# Patient Record
Sex: Female | Born: 1950 | Race: Black or African American | Hispanic: No | Smoking: Never smoker
Health system: Southern US, Community
[De-identification: ages and names within clinical notes are randomized; demographics above are authoritative.]

## PROBLEM LIST (undated history)

## (undated) ENCOUNTER — Ambulatory Visit: Source: Home / Self Care

## (undated) DIAGNOSIS — I10 Essential (primary) hypertension: Secondary | ICD-10-CM

## (undated) DIAGNOSIS — R569 Unspecified convulsions: Secondary | ICD-10-CM

## (undated) HISTORY — PX: ABDOMINAL HYSTERECTOMY: SHX81

## (undated) HISTORY — PX: EYE SURGERY: SHX253

## (undated) HISTORY — PX: BRAIN TUMOR EXCISION: SHX577

## (undated) HISTORY — PX: CATARACT EXTRACTION: SUR2

---

## 1997-11-19 ENCOUNTER — Encounter: Payer: Self-pay | Admitting: Emergency Medicine

## 1997-11-19 ENCOUNTER — Emergency Department (HOSPITAL_COMMUNITY): Admission: EM | Admit: 1997-11-19 | Discharge: 1997-11-19 | Payer: Self-pay | Admitting: Emergency Medicine

## 1999-04-24 ENCOUNTER — Emergency Department (HOSPITAL_COMMUNITY): Admission: EM | Admit: 1999-04-24 | Discharge: 1999-04-24 | Payer: Self-pay | Admitting: Emergency Medicine

## 1999-12-17 ENCOUNTER — Encounter: Payer: Self-pay | Admitting: Family Medicine

## 1999-12-17 ENCOUNTER — Ambulatory Visit (HOSPITAL_COMMUNITY): Admission: RE | Admit: 1999-12-17 | Discharge: 1999-12-17 | Payer: Self-pay | Admitting: Family Medicine

## 1999-12-29 ENCOUNTER — Encounter: Payer: Self-pay | Admitting: Neurosurgery

## 1999-12-29 ENCOUNTER — Inpatient Hospital Stay (HOSPITAL_COMMUNITY): Admission: EM | Admit: 1999-12-29 | Discharge: 2000-01-03 | Payer: Self-pay | Admitting: Emergency Medicine

## 1999-12-29 ENCOUNTER — Encounter: Payer: Self-pay | Admitting: Emergency Medicine

## 1999-12-31 ENCOUNTER — Encounter: Payer: Self-pay | Admitting: Neurosurgery

## 2000-01-02 ENCOUNTER — Encounter: Payer: Self-pay | Admitting: Neurosurgery

## 2000-01-08 ENCOUNTER — Encounter: Payer: Self-pay | Admitting: Neurosurgery

## 2000-01-08 ENCOUNTER — Inpatient Hospital Stay (HOSPITAL_COMMUNITY): Admission: RE | Admit: 2000-01-08 | Discharge: 2000-01-16 | Payer: Self-pay | Admitting: Neurosurgery

## 2000-01-08 ENCOUNTER — Encounter (INDEPENDENT_AMBULATORY_CARE_PROVIDER_SITE_OTHER): Payer: Self-pay | Admitting: Specialist

## 2000-01-09 ENCOUNTER — Encounter: Payer: Self-pay | Admitting: Neurosurgery

## 2000-01-11 ENCOUNTER — Encounter: Payer: Self-pay | Admitting: Neurosurgery

## 2000-01-28 ENCOUNTER — Encounter: Admission: RE | Admit: 2000-01-28 | Discharge: 2000-02-22 | Payer: Self-pay | Admitting: Neurosurgery

## 2000-02-19 ENCOUNTER — Encounter: Payer: Self-pay | Admitting: Neurosurgery

## 2000-02-19 ENCOUNTER — Ambulatory Visit (HOSPITAL_COMMUNITY): Admission: RE | Admit: 2000-02-19 | Discharge: 2000-02-19 | Payer: Self-pay | Admitting: Neurosurgery

## 2000-03-18 ENCOUNTER — Other Ambulatory Visit: Admission: RE | Admit: 2000-03-18 | Discharge: 2000-03-18 | Payer: Self-pay | Admitting: Gynecology

## 2000-03-28 ENCOUNTER — Encounter (INDEPENDENT_AMBULATORY_CARE_PROVIDER_SITE_OTHER): Payer: Self-pay

## 2000-03-28 ENCOUNTER — Other Ambulatory Visit: Admission: RE | Admit: 2000-03-28 | Discharge: 2000-03-28 | Payer: Self-pay | Admitting: Gynecology

## 2000-05-10 ENCOUNTER — Ambulatory Visit (HOSPITAL_COMMUNITY): Admission: RE | Admit: 2000-05-10 | Discharge: 2000-05-10 | Payer: Self-pay | Admitting: Neurosurgery

## 2000-05-10 ENCOUNTER — Encounter: Payer: Self-pay | Admitting: Neurosurgery

## 2000-08-07 ENCOUNTER — Encounter (INDEPENDENT_AMBULATORY_CARE_PROVIDER_SITE_OTHER): Payer: Self-pay | Admitting: Specialist

## 2000-08-07 ENCOUNTER — Inpatient Hospital Stay (HOSPITAL_COMMUNITY): Admission: RE | Admit: 2000-08-07 | Discharge: 2000-08-08 | Payer: Self-pay | Admitting: Gynecology

## 2000-10-13 ENCOUNTER — Ambulatory Visit (HOSPITAL_COMMUNITY): Admission: RE | Admit: 2000-10-13 | Discharge: 2000-10-13 | Payer: Self-pay | Admitting: Family Medicine

## 2000-10-13 ENCOUNTER — Encounter: Payer: Self-pay | Admitting: Family Medicine

## 2000-11-13 ENCOUNTER — Encounter: Payer: Self-pay | Admitting: Neurosurgery

## 2000-11-13 ENCOUNTER — Ambulatory Visit (HOSPITAL_COMMUNITY): Admission: RE | Admit: 2000-11-13 | Discharge: 2000-11-13 | Payer: Self-pay | Admitting: Neurosurgery

## 2001-11-12 ENCOUNTER — Other Ambulatory Visit: Admission: RE | Admit: 2001-11-12 | Discharge: 2001-11-12 | Payer: Self-pay | Admitting: Gynecology

## 2002-10-31 ENCOUNTER — Emergency Department (HOSPITAL_COMMUNITY): Admission: EM | Admit: 2002-10-31 | Discharge: 2002-11-01 | Payer: Self-pay | Admitting: Emergency Medicine

## 2003-02-22 ENCOUNTER — Emergency Department (HOSPITAL_COMMUNITY): Admission: EM | Admit: 2003-02-22 | Discharge: 2003-02-22 | Payer: Self-pay | Admitting: Emergency Medicine

## 2003-02-23 ENCOUNTER — Encounter (INDEPENDENT_AMBULATORY_CARE_PROVIDER_SITE_OTHER): Payer: Self-pay | Admitting: *Deleted

## 2003-02-23 ENCOUNTER — Ambulatory Visit (HOSPITAL_COMMUNITY): Admission: RE | Admit: 2003-02-23 | Discharge: 2003-02-23 | Payer: Self-pay | Admitting: Gastroenterology

## 2003-06-24 ENCOUNTER — Emergency Department (HOSPITAL_COMMUNITY): Admission: EM | Admit: 2003-06-24 | Discharge: 2003-06-24 | Payer: Self-pay | Admitting: Family Medicine

## 2004-07-23 ENCOUNTER — Encounter (HOSPITAL_COMMUNITY): Admission: RE | Admit: 2004-07-23 | Discharge: 2004-10-21 | Payer: Self-pay | Admitting: Family Medicine

## 2004-10-29 ENCOUNTER — Emergency Department (HOSPITAL_COMMUNITY): Admission: EM | Admit: 2004-10-29 | Discharge: 2004-10-29 | Payer: Self-pay | Admitting: Family Medicine

## 2004-10-31 ENCOUNTER — Emergency Department (HOSPITAL_COMMUNITY): Admission: EM | Admit: 2004-10-31 | Discharge: 2004-10-31 | Payer: Self-pay | Admitting: Family Medicine

## 2005-03-23 ENCOUNTER — Emergency Department (HOSPITAL_COMMUNITY): Admission: EM | Admit: 2005-03-23 | Discharge: 2005-03-23 | Payer: Self-pay | Admitting: Family Medicine

## 2005-12-28 ENCOUNTER — Emergency Department (HOSPITAL_COMMUNITY): Admission: EM | Admit: 2005-12-28 | Discharge: 2005-12-28 | Payer: Self-pay | Admitting: Family Medicine

## 2006-08-06 ENCOUNTER — Encounter: Admission: RE | Admit: 2006-08-06 | Discharge: 2006-08-06 | Payer: Self-pay | Admitting: Family Medicine

## 2006-12-05 ENCOUNTER — Emergency Department (HOSPITAL_COMMUNITY): Admission: EM | Admit: 2006-12-05 | Discharge: 2006-12-05 | Payer: Self-pay | Admitting: Family Medicine

## 2007-01-12 ENCOUNTER — Encounter (HOSPITAL_COMMUNITY): Admission: RE | Admit: 2007-01-12 | Discharge: 2007-01-13 | Payer: Self-pay | Admitting: Internal Medicine

## 2007-03-20 ENCOUNTER — Encounter: Admission: RE | Admit: 2007-03-20 | Discharge: 2007-03-20 | Payer: Self-pay | Admitting: Orthopedic Surgery

## 2007-07-04 ENCOUNTER — Emergency Department (HOSPITAL_COMMUNITY): Admission: EM | Admit: 2007-07-04 | Discharge: 2007-07-04 | Payer: Self-pay | Admitting: Family Medicine

## 2007-12-14 ENCOUNTER — Ambulatory Visit (HOSPITAL_COMMUNITY): Admission: RE | Admit: 2007-12-14 | Discharge: 2007-12-15 | Payer: Self-pay | Admitting: Internal Medicine

## 2007-12-18 ENCOUNTER — Encounter (HOSPITAL_COMMUNITY): Admission: RE | Admit: 2007-12-18 | Discharge: 2008-02-11 | Payer: Self-pay | Admitting: Internal Medicine

## 2008-12-20 ENCOUNTER — Emergency Department (HOSPITAL_COMMUNITY): Admission: EM | Admit: 2008-12-20 | Discharge: 2008-12-20 | Payer: Self-pay | Admitting: Family Medicine

## 2009-10-30 ENCOUNTER — Encounter: Admission: RE | Admit: 2009-10-30 | Discharge: 2009-10-30 | Payer: Self-pay | Admitting: Family Medicine

## 2009-11-22 ENCOUNTER — Ambulatory Visit: Payer: Self-pay | Admitting: Diagnostic Radiology

## 2009-11-22 ENCOUNTER — Emergency Department (HOSPITAL_BASED_OUTPATIENT_CLINIC_OR_DEPARTMENT_OTHER): Admission: EM | Admit: 2009-11-22 | Discharge: 2009-11-22 | Payer: Self-pay | Admitting: Emergency Medicine

## 2010-03-25 ENCOUNTER — Encounter: Payer: Self-pay | Admitting: Internal Medicine

## 2010-03-25 ENCOUNTER — Encounter: Payer: Self-pay | Admitting: Family Medicine

## 2010-03-26 ENCOUNTER — Encounter: Payer: Self-pay | Admitting: Internal Medicine

## 2010-07-13 ENCOUNTER — Emergency Department (HOSPITAL_COMMUNITY): Payer: BC Managed Care – PPO

## 2010-07-13 ENCOUNTER — Emergency Department (HOSPITAL_COMMUNITY)
Admission: EM | Admit: 2010-07-13 | Discharge: 2010-07-13 | Disposition: A | Discharge status: Home / Self Care | Payer: BC Managed Care – PPO | Attending: Emergency Medicine | Admitting: Emergency Medicine

## 2010-07-13 DIAGNOSIS — T426X1A Poisoning by other antiepileptic and sedative-hypnotic drugs, accidental (unintentional), initial encounter: Secondary | ICD-10-CM | POA: Insufficient documentation

## 2010-07-13 DIAGNOSIS — R269 Unspecified abnormalities of gait and mobility: Secondary | ICD-10-CM | POA: Insufficient documentation

## 2010-07-13 DIAGNOSIS — R404 Transient alteration of awareness: Secondary | ICD-10-CM | POA: Insufficient documentation

## 2010-07-13 DIAGNOSIS — R4789 Other speech disturbances: Secondary | ICD-10-CM | POA: Insufficient documentation

## 2010-07-13 DIAGNOSIS — R110 Nausea: Secondary | ICD-10-CM | POA: Insufficient documentation

## 2010-07-13 DIAGNOSIS — Z79899 Other long term (current) drug therapy: Secondary | ICD-10-CM | POA: Insufficient documentation

## 2010-07-13 DIAGNOSIS — R42 Dizziness and giddiness: Secondary | ICD-10-CM | POA: Insufficient documentation

## 2010-07-13 DIAGNOSIS — Z7982 Long term (current) use of aspirin: Secondary | ICD-10-CM | POA: Insufficient documentation

## 2010-07-13 DIAGNOSIS — R471 Dysarthria and anarthria: Secondary | ICD-10-CM | POA: Insufficient documentation

## 2010-07-13 DIAGNOSIS — I10 Essential (primary) hypertension: Secondary | ICD-10-CM | POA: Insufficient documentation

## 2010-07-13 DIAGNOSIS — D320 Benign neoplasm of cerebral meninges: Secondary | ICD-10-CM | POA: Insufficient documentation

## 2010-07-13 DIAGNOSIS — R51 Headache: Secondary | ICD-10-CM | POA: Insufficient documentation

## 2010-07-13 LAB — URINALYSIS, ROUTINE W REFLEX MICROSCOPIC
Bilirubin Urine: NEGATIVE
Glucose, UA: NEGATIVE mg/dL
Hgb urine dipstick: NEGATIVE
Protein, ur: NEGATIVE mg/dL

## 2010-07-13 LAB — COMPREHENSIVE METABOLIC PANEL
ALT: 9 U/L (ref 0–35)
CO2: 30 mEq/L (ref 19–32)
Calcium: 10.5 mg/dL (ref 8.4–10.5)
Creatinine, Ser: 0.71 mg/dL (ref 0.4–1.2)
GFR calc Af Amer: >60 mL/min (ref >60)
GFR calc non Af Amer: >60 mL/min (ref >60)
Glucose, Bld: 102 mg/dL — ABNORMAL HIGH (ref 70–99)
Sodium: 140 mEq/L (ref 135–145)
Total Protein: 7.5 g/dL (ref 6.0–8.3)

## 2010-07-13 LAB — DIFFERENTIAL
Basophils Absolute: 0 10*3/uL (ref 0.0–0.1)
Lymphocytes Relative: 19 % (ref 12–46)
Lymphs Abs: 1.4 10*3/uL (ref 0.7–4.0)
Neutrophils Relative %: 74 % (ref 43–77)

## 2010-07-13 LAB — CBC
HCT: 40 % (ref 36.0–46.0)
MCV: 86.8 fL (ref 78.0–100.0)
Platelets: 296 10*3/uL (ref 150–400)
RBC: 4.61 MIL/uL (ref 3.87–5.11)
WBC: 7.5 10*3/uL (ref 4.0–10.5)

## 2010-07-13 LAB — CK TOTAL AND CKMB (NOT AT ARMC)
CK, MB: 1.4 ng/mL (ref 0.3–4.0)
Total CK: 79 U/L (ref 7–177)

## 2010-07-13 LAB — TROPONIN I: Troponin I: <0.3 ng/mL (ref <0.30)

## 2010-07-13 MED ORDER — GADOBENATE DIMEGLUMINE 529 MG/ML IV SOLN
20.0000 mL | Freq: Once | INTRAVENOUS | Status: AC
  Administered 2010-07-13: 20 mL via INTRAVENOUS

## 2010-07-20 NOTE — Op Note (Signed)
[place]. [facility]  Patient:    Deborah Gallegos, Deborah Gallegos                      MRN: [id number] Proc. Date: 01/08/00 Adm. Date:  [id number] Attending:  Barton Fanny                           Operative Report  PREOPERATIVE DIAGNOSIS:  Right sphenoid wing meningioma.  POSTOPERATIVE DIAGNOSIS:  Right sphenoid ring meningioma.  PROCEDURE: 1. Exposure of right extracranial carotid artery. 2. Right pterional craniotomy and subtotal resection of right sphenoid wing    meningioma with Stealth stereotaxis and microdissection with the operating    microscope. 3. Placement of left frontal intraventricular catheter.  SURGEON:  Hewitt Shorts, M.D.  ASSISTANTS:  Mena Goes. Franky Macho, M.D., and Danae Orleans. Venetia Maxon, M.D.  ANESTHESIA:  General endotracheal.  INDICATION:  This is a 60 year old woman who presented with a single seizure with a large right sphenoid ring meningioma, approximately 5 cm in diameter, which encased the right internal carotid artery and caused significant and displacement of the brainstem.  The decision was made to proceed with craniotomy and resection of this tumor.  To facilitate the proximal arterial control, the extracranial carotid artery was exposed, and a left frontal intraventricular catheter was placed to provide relaxation via CSF drainage.  DESCRIPTION OF PROCEDURE:  Patient brought to the operating room and placed under general endotracheal anesthesia.  The patient was placed in a three-pin [place] head holder.  Rolls were placed beneath the right shoulder, and the head was gently turned to the left and gently extended.  The Stealth stereotaxis had already been loaded into the machine and the Stealth apparatus was assembled, and localization and identification of the head and frame was performed.  We then shaved the frontal scalp bilaterally as well as the right temporal scalp, and then the scalp and right side of the neck was  prepped with Betadine soap and solution and draped in a sterile fashion.  We first exposed the right extracranial carotid artery.  An oblique incision was made paralleling the anterior border of the sternocleidomastoid.  Dissection was carried down through the subcutaneous tissue.  Bipolar cautery was used to maintain hemostasis.  Dissection was carried down through the platysma, and then dissection was carried out past the jugular vein to the right common carotid artery.  This was dissected distally to the carotid bifurcation, and we placed vessel loops around the right common carotid artery, the right internal carotid artery, the right external carotid artery, and the right superior thyroid artery.  We then packed a bacitracin-soaked sponge in that wound and then proceeded with the craniotomy incision.  A curvilinear right pterional incision was made.  We had mapped out the superior temporal artery with the Doppler.  The incision was made through the skin, and then dissection was carried out bluntly through the subcutaneous tissue; however, as we dissected down to the temporalis fascia, we were never able to find any significant arterial vessels, and certainly not a prominent superficial temporal artery.  The entire wound was opened in a careful fashion without any significant bleeding but again without identifying the superficial temporal artery, and certainly it had not been transected.  However, it was felt to be quite small.  We then did an interfascial flap, incising across the superior aspect of the temporalis fascia and muscle and then opening the temporalis fascia  about one-third of the way posteriorly in a curvilinear vertical fashion, essentially behind the fat pad so as to preserve the branch of the facial nerve to the frontalis.  This was was flapped along with the scalp flap toward the pterion; however, the temporalis muscle and the posterior aspect of the temporalis fascia  was flapped posterior inferiorly.  Good exposure was created.  We then placed the left frontal intraventricular catheter.  A 2 cm in length incision was made in the left midpupillary line overlying the coronal suture.  Periosteum was divided and then using the Midas Rex drill with an M8 bur, a small bur hole was made, the dura was coagulated and incised, and then the pia was coagulated and incised.  Then we passed a ventricular catheter into the left frontal horn.  Good CSF flow was noted.  It was capped off and tunneled subcutaneously with a trocar and connected to a collection system.  It was left clamped until the dura was subsequently opened, and then it was opened to allow for drainage.  We then proceeded with the craniotomy.  Three bur holes were made using the M3 and M8 burs.  The dura was dissected from the overlying skull and then using the B1 craniotome attachment, we were able to cut between the bur holes.  Then using the M8 bur, we were able to drill down the pterion and the sphenoid wing.  The bone flap was then mobilized and removed.  We then with loupe magnification proceeded to remove additional portions of the sphenoid wing, and good removal of the sphenoid wing was achieved.  Using the double action on the drill, the craniotomy was extended inferiorly and anteriorly to expose more of the temporal fossa.  The dura was then opened in a curvilinear fashion and then was split toward the pterion and flapped to either side.  The brain swelled slightly, and the intraventricular catheter was opened and good relaxation was achieved with approximately 20 cc of CSF drainage.  We were then able to identify the tumor in the anterior portion of the temporal fossa. The anterior temporal lobe, which had been severely compressed and thinned, was gently dissected away from the tumor, and the small remnant of residual anterior temporal lobe was removed with subpial dissection using  bipolar cautery and suction.  The tumor itself was firm but not hard.  We were able to coagulate its capsule and small veins and vessels entering it using bipolar cautery.  We then incised the capsule and began to debulk the tumor using the  Cavitron ultrasonic aspirator.  This was a slow process because the tumor was moderately fibrous.  Portions of the tumor were sent for frozen section, which reported a meningioma.  Additional portions of tumor had been sent for permanent section.  As the tumor was debulked, we were gradually able to dissect around the arachnoid plane around the lesion, separating the brain surface from the tumor.  We were then able to remove additional portions of tumor and gradually removed the tumor that filled the anterior right temporal fossa.  The microscope was then draped and brought into the field to provide [place] with magnification and illumination and visualization, and the remainder of the procedure was performed using microdissection technique.  We continued to debulk the tumor, and attention was paid to the area of the tumor that protruded through the tentorial incisura.  This tumor was carefully debulked and removed.  There was a portion of the tumor overlying  the sphenoid wing lying in the right frontal fossa, and this was debulked as well using a similar technique of exposing the arachnoid plane, coagulating small vessels entering into the tumor, incising the tumor, debulking it with the Cavitron ultrasonic aspirator, and gently shrinking back the capsule with bipolar cautery.  As the tumor resection proceeded, we identified branches of the middle cerebral artery.  These were freed from the tumor as feasible; however, as we followed the artery proximally, it became entirely encased by tumor, which could not be dissected away from it, and it was felt that the risk of further dissection with resulting vascular injury was too great to proceed with further  tumor resection of that portion of the tumor directly adherent to the middle cerebral artery.  There were additional portions of the tumor which extended into the right cavernous sinus and which fully encased the right internal carotid artery, and it was felt that proceeding with additional tumor resection in this region would result in catastrophic complications and deficits, and therefore, once all of the tumor that was feasible to be removed safely for the patient had been removed, we established hemostasis, lined the resection cavity, the remaining edges of tumor, and the pial surface that had been adjacent to the tumor with Surgicel and then proceeded with closure.  The resection cavity was filled with saline.  Good hemostasis was established and confirmed.  The dura was closed with interrupted 4-0 Nurolon sutures.  The bone flap was secured with Osteomed plates with a bur hole cover placed two medium straight plates.  We used the 4 mm screws.  Prior to placing the bone flap, the dura was tacked up around the margins of the craniotomy with dural tack-up sutures.  Once the bone flap was secured, temporalis fascia and muscle were approximated with interrupted 2-0 running Vicryl sutures, the scalp was then closed by closing the galea with interrupted inverted 2-0 interrupted Vicryl sutures.  The skin edges were closed with surgical staples.  We then re-exposed the right extracranial carotid artery exposure.  We carefully removed the vessel loops and then after confirming hemostasis was present proceeded with closure.  The platysma was closed with interrupted 2-0 interrupted Vicryl sutures, the subcutaneous and subcuticular layer closed with 2-0 running Vicryl sutures, and the skin edges were approximated with surgical staples.  The patient tolerated the procedure well.  The estimated blood loss was 600 cc.  The patient was given two units of packed red blood cells.  Following surgery,  she was not reversed from anesthetic but rather transferred to the neurosurgical intensive care unit intubated and placed on a ventilator for further care. DD:  01/08/00 TD:  01/09/00 Job: [id number]

## 2010-07-20 NOTE — Discharge Summary (Signed)
[place]. [facility]  Patient:    Deborah Gallegos, Deborah Gallegos                      MRN: [id number] Adm. Date:  [id number] Disc. Date: 01/03/00 Attending:  Barton Fanny                           Discharge Summary  ADMISSION HISTORY AND PHYSICAL EXAMINATION:  The patient is a 60 year old right handed black female who was brought to the emergency room by EMS after having suffered a seizure at home.  She had never had any previous seizure activity.  She was evaluated by the emergency room physician and found by CT scan to have a large 5 cm in diameter right sphenoid lumen meningioma that encased the right internal carotid artery and was associated with significant surrounding cerebral edema, mass effect and midline shift.  The patient had been treated in the past for hypertension but was not currently being treated. She also had a history of kidney stones.  She has no allergies.  She took no medications on a regular basis.  General examination was unremarkable. Neurologic examination showed the patient to be neurologically intact.  HOSPITAL COURSE:  The patient was admitted and started on Dilantin. She was loaded with 1000 mg and started on 200 mg b.i.d.  We rechecked Dilantin level the following day and it was 10.6 and she was reloaded with additional Dilantin and her Dilantin level has been in the high therapeutic range since that time.  On the day of discharge Dilantin level was 17.0.  She is to be continued on 200 mg b.i.d.  An MRI and MRA of the brain was performed which showed encasement of the intracranial right internal carotid artery as well as displacement of the brain stem and right middle cerebral artery.  It was felt that most likely the tumor invades the right cavernous sinus and the right optic canal.  The patient was found on admission to have a significant anemia with hemoglobin of 8.0 and a hematocrit of 26.3.  Serum iron was found to be  less than 10.  Serum ferritin was 3.  These are consistent with a profound iron deficiency anemia.  Pelvic ultrasound was performed which revealed enlarged uterus with numerous fibroids with the largest being over 5 cm in diameter. GYN consultation was obtained by Dr. Teodora Medici who confirmed the diagnosis of uterine fibroids resulting in menorrhagia.  He discussed the options of total abdominal hysterectomy.  He plans on having the patient follow up with him next month, after she recuperates from her brain surgery.  At this point, the patient is anticipating wanting to go ahead with hysterectomy.  The patients seizures have been controlled with Dilantin.  She has had no seizure activity since hospitalization.  She was found to be hypokalemic.  She was treated with potassium and the hypokalemia has resolved.  Cerebral arteriography was performed.  The tumor was visualized and had a slight blush but has minimal arterial feeders off the meningohypophyseal trunk, the anterior ethmoid and the middle meningeal as well as some small feeders.  However, it was not felt that this was sufficiently vascular to warrant interventional embolization.  Her arteriogram showed that the internal carotid artery and the right middle cerebral artery with no communication to the right anterior cerebral artery which instead is fed off the left internal carotid artery; however, there is anastomosis  from the vertebral basilar system through the right posterior communicating artery which does fill the right middle cerebral artery when there is compression of the right internal carotid artery.  The patient was seen in primary care consultation by Dr. Renaye Rakers.  She did develop a herpetic fever sore on her lip most likely due to her Decadron.  She was started on Valtrex which will be continued upon her discharge.  Our plan is to readmit the patient on January 08, 2000 (in five days) for craniotomy for  resection of her tumor with Stealth stereotatic guidance. A Stealth MRI was performed as well.  We will expose the right extracranial carotid artery to allow for proximal control.  Thyroid function studies were obtained and are normal.  I have spoken with the patient and her son at length regarding her condition, the findings of her studies, and our recommendations and plans for treatment. The patients and her sons questions have been answered and will allow her to be discharged to home and return in five days for surgery.  DISCHARGE DIAGNOSES: 1. Large right sphenoid lumen angioma. Scheduled for resection on January 08, 2000. 2. New onset seizure disorder with a single seizure at home.  Seizure disorder    is secondary to her brain tumor and has been well controlled with Dilantin. 3. Severe iron deficiency anemia secondary to menorrhagia secondary to fibroid    uterus.  The patient has been seen in gynecology consultation and plan is    to proceed with total abdominal hysterectomy after she recuperates from her    craniotomy. 4. Hypokalemia.  Most likely secondary to diuretics previously used for    treatment of hypertension which has been corrected with potassium    supplementation. 5. Herpetic fever blisters secondary to Decadron treated with Valtrex.  The patient was transfused two units of packed red blood cells in anticipation of craniotomy next week and her hemoglobin rose up to 10.5 grams.  DISCHARGE MEDICATIONS:  Iron sulfate 325 mg one tablet p.o. t.i.d., 100 tablets with three refills.  Valtrex 500 mg capsules one capsule p.o. b.i.d., 10 capsules, no refills.  Pepcid 20 mg one tablet p.o. b.i.d., 6 tablets, one refill.  Decadron 4 mg tablets, one tablet p.o. q.i.d., 50 tablets, no refills. Dilantin 100 mg capsules, two capsules p.o. b.i.d., 125 capsules, six refills, brand name only. DD:  01/03/00 TD:  01/03/00 Job: [id number]

## 2010-07-20 NOTE — Op Note (Signed)
NAME:  Deborah Gallegos, Deborah Gallegos                         ACCOUNT NO.:  [id number]   MEDICAL RECORD NO.:  [id number]                   PATIENT TYPE:  AMB   LOCATION:  ENDO                                 FACILITY:  [facility]   PHYSICIAN:  John C. Madilyn Fireman, M.D.                 DATE OF BIRTH:  02/04/1951   DATE OF PROCEDURE:  02/23/2003  DATE OF DISCHARGE:                                 OPERATIVE REPORT   PROCEDURE:  Colonoscopy with polypectomy and biopsy.   INDICATION FOR PROCEDURE:  Rectal bleeding and lower abdominal cramping  recently.   DESCRIPTION OF PROCEDURE:  The patient was placed in the left lateral  decubitus position and placed on the pulse monitor with continuous low-flow  oxygen delivered by nasal cannula.  She was sedated with 100 mcg IV fentanyl  and 8 mg IV Versed.  The Olympus video colonoscope was inserted into the  rectum and advanced to the cecum, confirmed by transillumination at  McBurney's point and visualization of the ileocecal valve and appendiceal  orifice.  The prep was excellent.  The cecum, ascending colon, and  transverse colon appeared normal with no masses, polyps, diverticula, or  other mucosal abnormalities.  There was a transition that appeared to be a  fine, granular erythema and edema near the splenic flexure into the  descending colon that gradually faded to normal mucosa somewhere around the  proximal transverse colon, and the mucosa remained normal down to the anus.  The appearance although in distribution more suggestive of an ischemic  process, appeared endoscopically more consistent with mild inflammatory  bowel disease.  There were no submucosal hemorrhages or large or aphthous  ulcers.  Also, in the descending colon, there was a 1.5 cm pedunculated  polyp that was removed by snare.  Otherwise, the descending and sigmoid  revealed no other abnormalities.  At the rectosigmoid junction, there was a  broad, flat, approximately 2 cm in diameter polyp at  about 20 cm, and this  was removed by snare and sent in a separate specimen container.  The  remainder of the rectum appeared normal.  The scope was then withdrawn, and  the patient returned to the recovery room in stable condition.  She  tolerated the procedure well, and there were no immediate complications.   IMPRESSION:  1. Large descending and sigmoid polyps.  2. Appearance of colitis in the descending and part of the sigmoid colon.   PLAN:  Await all biopsy results.                                               John C. Madilyn Fireman, M.D.    J[facility]/MEDQ  D:  02/23/2003  T:  02/23/2003  Job:  [id number]   cc:   Renaye Rakers, M.D.  [address], [address]  [place]  [place] [address]  Fax: [phone]

## 2010-07-20 NOTE — H&P (Signed)
[place]. [facility]  Patient:    Deborah Gallegos, Deborah Gallegos                      MRN: [id number] Adm. Date:  [id number] Attending:  Barton Fanny CC:         Geraldo Pitter, M.D.   History and Physical  HISTORY OF PRESENT ILLNESS:  The patient is a 60 year old right-handed black female who presented to the [facility] emergency room having been brought there by EMS after having suffered a seizure at home.  She has never had any previous seizure activity.  She was noted by her son and daughter to have a tonic-type seizure that lasted 10 to 15 minutes.  She was brought to the emergency room by EMS and has had no further seizure activity.  The patient was evaluated by Doug Sou, M.D., the emergency room physician.  CT scan of the brain without and with contrast was obtained and revealed a large 5 cm in diameter right sphenoid wing meningioma that may well encase the right internal carotid artery and it has significant surrounding cerebral edema, overall mass effect, and midline shift.  Neurosurgical consultation was requested by Dr. Ethelda Chick.  The patient has had some occasional headaches over the past couple of weeks on one or two occasions for which she has taken some Aleve.  She has not been having consistent or severe headaches.  She denies any diplopia, blurred vision, weakness, language difficulties, or other neurologic dysfunction.  PAST MEDICAL HISTORY:  She has been treated in the past for hypertension, but does not currently require treatment.  She also has a history of kidney stones. She has no history of myocardial infarction, cancer, stroke, peptic ulcer disease, diabetes mellitus, or lung disease.  Her only previous surgery was a basketing of her kidney stones about 10 to 15 years ago.  She has had no other surgeries.  ALLERGIES:  No known drug allergies.  MEDICATIONS:  None on a regular basis.  FAMILY HISTORY:  Her  mother is age 61 and in good health.  Her father died at an unknown age of emphysema, he was a smoker.  SOCIAL HISTORY:  The patient is widowed.  She has two sons and one daughter. She is a third grade school Runner, broadcasting/film/video at [employer].  She does not smoke tobacco or drink alcoholic beverages.  REVIEW OF SYSTEMS:  Unremarkable other than is described in her history of present illness and past medical history.  PHYSICAL EXAMINATION:  GENERAL:  The patient is a well-developed, well-nourished black female in no acute distress.  VITAL SIGNS:  Temperature 98.6, pulse 95, blood pressure 155/63, respiratory rate 22.  LUNGS:  Clear to auscultation.  She has symmetrical respiratory excursion.  HEART:  Regular rate and rhythm.  Normal S1 and S2.  There is no murmur.  ABDOMEN:  Soft and nontender.  Bowel sounds are present.  EXTREMITIES:  No clubbing, cyanosis, or edema.  NEUROLOGICAL:  The patient is awake, alert, oriented to her name, [facility], and October of 2001.  Her speech is full and she has good comprehension.  Cranial nerves show her pupils to be equal, round, and reactive to light and about 2.5 mm bilaterally.  Extraocular movements were intact.  Face has intact sensation.  Facial movement is symmetrical.  Hearing is intact.  Palatal movement is symmetrical, shoulder shrug is symmetrical, and tongue protrudes in the midline and well to  either side.  Motor examination shows 5/5 strength in the upper and lower extremities.  She has no drift of the upper extremities.  Sensory examination shows intact sensation to pinprick and tough.  Reflexes are symmetrical.  IMPRESSION: 1. Right sphenoid wing meningioma at least 5 cm in diameter with surrounding    cerebellar edema, significant overall mass effect, and midline shift.  The    lesion may well encase the right carotid artery. 2. New onset seizure secondary to #1.  PLAN:  The patient will be  admitted to the neurosurgical advanced care unit. She is being loaded with dilantin in the emergency room and will continue on dilantin subsequent to her admission.  She has been started on intravenous Decadron and will be started as well on intravenous Pepcid.  MRI and MRA of the brain will be obtained and she may well require cerebral arteriogram.  In the end, she will require resection of this lesion.  I have spoken with the patient and her son who is present throughout our evaluation this morning.  We had a chance to show them her CT scan and explain the nature of her lesion and our plans for further workup and treatments. Their questions were answered.  They do understand the seriousness of this situation.  They do also understand that this is a serious, but benign type of brain tumor. DD:  12/29/99 TD:  12/29/99 Job: [id number]

## 2010-07-20 NOTE — Consult Note (Signed)
[place]. [facility]  Patient:    Deborah Gallegos, Deborah Gallegos                      MRN: [id number] Proc. Date: 01/02/00 Adm. Date:  [id number] Attending:  Barton Fanny CC:         Hewitt Shorts, M.D.  Geraldo Pitter, M.D.   Consultation Report  CONSULT REQUESTING PHYSICIAN:  Hewitt Shorts, M.D.  REASON FOR CONSULTATION:  Fibroids, menorrhagia, anemia.  HISTORY OF PRESENT ILLNESS:  The patient is a 60 year old, gravida 3, para 3 black woman who has not had a pelvic exam or gynecological care for grater than 10 years.  Over the last two years, she has noted her menstrual flow to be significantly heavier and her cycles have remained regular with no intermenstrual bleeding.  She has not been recently sexually active and had no dyspareunia previously.  Ultrasound examination performed on December 31, 1999, revealed the uterus to be enlarged with numerous fibroids, the largest measuring 5.3 cm in the body of the uterus with a probable submucosal component.  The endometrial stripe was 11 mm.  Both ovaries contained what were described as complex ovarian cysts, 16 and 19 mm.  PAST MEDICAL HISTORY:  Surgical:  Cesarean section.  Medical:  Current brain tumor.  MEDICATIONS:  None outside of the hospital.  ALLERGIES:  None known.  HABITS:  Smoking - none.  ETOH - none.  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  Her children are very supportive.  FAMILY HISTORY:  Mother, question of fibroids, and no known cancer history.  PHYSICAL EXAMINATION:  Given the nature of the consultation, examination is deferred until the patients office visit.  PLAN:  Options for treatment were discussed at length with the patient including a total abdominal hysterectomy and uterine fibroid embolization. Both procedures and anticipated outcomes were discussed at length.  The patient will make an  appointment to be seen in the office in December to further discuss options for therapy and to make plans for therapy based on her recovery status from her impending neurosurgery. DD:  01/02/00 TD:  01/02/00 Job: [id number]

## 2010-07-20 NOTE — Discharge Summary (Signed)
[place]. [facility]  Patient:    Deborah Gallegos, Deborah Gallegos                      MRN: [id number] Adm. Date:  [id number] Disc. 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  HOSPITAL COURSE:  The patient was admitted to the hospital on a same day as procedure basis on January 08, 2000, and underwent exposure of the extracranial carotid artery with pterional craniotomy and subtotal resection of tumor using microdissection and stealth procedure.  She did well with that surgery.  She was observed in the ICU.  She was initially lethargic and had a left hemiparesis which subsequently improved over the course of the next day. She has been observed on the floor.  She had a herpetic facial rash which was treated with Zovirax.  She continued to improved.  She required physical therapy and rehabilitation while an inpatient on the neurosurgery service. She was doing well on January 16, 2000, and discharged at that point in stable and satisfactory condition, having tolerated her operation and hospitalization well.  DISCHARGE MEDICATIONS: 1. Decadron taper. 2. Tylenol No. 3 as needed for pain. 3. Dilantin for seizure prophylaxis 4. She was started on Valtrex for her herpetic eruption.  FOLLOW-UP:  Instructed to follow up in two to three weeks with Hewitt Shorts, M.D. DD:  03/21/00 TD:  03/23/00 Job: [id number]

## 2010-07-20 NOTE — Op Note (Signed)
[facility]  Patient:    Deborah Gallegos, Deborah Gallegos                    MRN: [id number] Proc. Date: 08/07/00 Adm. Date:  [id number] Disc. Date: [id number] Attending:  Rolinda Roan CC:         Harl Bowie, M.D.   Operative Report  PREOPERATIVE DIAGNOSES:  Fibroid uterus, menorrhagia.  POSTOPERATIVE DIAGNOSES:  Fibroid uterus, menorrhagia.  OPERATION PERFORMED:  Total abdominal hysterectomy and bilateral salpingo-oophorectomy.  SURGEON:  Leatha Gilding. Mezer, M.D.  ASSISTANT:  Harl Bowie, M.D.  ANESTHESIA:  General endotracheal.  PREPARATION:  Betadine.  DESCRIPTION OF PROCEDURE:  With the patient in the supine position, she was prepped and draped in the routine fashion.  After careful evaluation and examination under anesthesia, the decision was made to make a Pfannenstiel incision.  This was made at a point higher than the previous C-section given the patients significant obesity.  The incision was carried down through the subcutaneous tissue, and the fascia and peritoneum were opened without difficulty.  Exploration of the upper abdomen was benign.  Exploration of the pelvis revealed the uterus to be approximately 18 weeks in size with multiple leiomyomata.  Both ovaries were grossly normal.  The uterus was maneuvered in position so that both round ligaments could be suture ligated and divided. Given the limited space available, the uteroovarian ligaments were clamped, cut, and free tied with #1 chromic to take the ovaries out after there was more space for a safer dissection.  The anterior leaf of the broad ligament was then opened.  The uterine arteries were clamped, cut, and suture ligated with #1 chromic.  The fundus of the uterus was then removed.  The ovaries were then more carefully evaluated.  The pelvic sidewalls were opened, the ureters identified, the infundibulopelvic ligament skeletonized, clamped, cut, and free tied with #1  chromic and suture ligated with #1 chromic.  There appeared to be endometriosis on the uterus, and there appeared to be endometriosis and scarring in the cul-de-sac.  For this reason, the decision was made to proceed and remove the cervix.  There was significant scarring of the bladder to the cervical area from the previous cesarean section.  The bladder was taken down sharply and bluntly without trauma to the bladder.  The cardinal ligaments were taken in several bites, clamped, cut, and suture ligated with #1 chromic. The uterosacral ligaments were taken separately, clamped, cut, and suture ligated with #1 chromic.  The vagina was entered posteriorly, and the specimen was excised with circumferential dissection.  The cervix and cuff were quite large and very bloody.  The angles were then closed with TeLinde type angled sutures of #1 chromic, and the cuff was then whipped anteriorly and posteriorly with running lock #1 chromic suture.  Two anterior and posterior sutures were taken to decrease the opening of the vagina.  The bladder was kept out of harms way.  The pelvis was irrigated with copious amounts of warm lactated Ringers solution and hemostasis noted to be intact.  The bladder was then placed over the vaginal cuff with a 3-0 Vicryl suture.  At the completion of the procedure with hemostasis intact, an effort was made to place the large bowel in the cul-de-sac.  The omentum was brought down, and the abdomen was closed in layers using a running 2-0 Vicryl on the peritoneum, running 0 Vicryl to the midline bilaterally on the fascia.  Hemostasis was assured  in the subcutaneous tissue, and the skin was closed with staples.  The estimated blood loss was approximately 800 cc.  The sponge, needle, and instrument counts were correct x 2.  The patient tolerated the procedure well and was taken to the recovery room in satisfactory condition. DD:  08/07/00 TD:  08/08/00 Job:  [id number]

## 2010-07-20 NOTE — H&P (Signed)
[place]. [facility]  Patient:    Deborah Gallegos, Deborah Gallegos                      MRN: [id number] Adm. Date:  [id number] Attending:  Barton Fanny CC:         Hewitt Shorts, M.D.   History and Physical  CHIEF COMPLAINT: The patient is a 60 year old right-handed black female, readmitted now having been discharged five days ago and originally admitted ten days ago after having presented with new onset seizure at home.  HISTORY OF PRESENT ILLNESS: She has only had a single seizure.  She was evaluated in the emergency room with CT scan and found to have a large 5 cm in diameter right sphenoid wing meningioma.  The patient was treated with Dilantin and has had no further seizure activity. She was admitted to the hospital and MRI and MRA of the brain were performed, showing large right sphenoid wing meningioma, with encasement of the intracranial right internal carotid artery and displacement of the brain stem and right middle cerebral artery.  It also demonstrated invasion of the tumor into the right cavernous sinus and into the right optic canal.  Additional problems found at admission included a hemoglobin of 8 with hematocrit of 26.3.  Further work-up revealed a serum iron level of less than 10, serum ferritin level of 3, which were consistent with profound iron deficiency anemia.  Pelvic ultrasound was performed and revealed an enlarged uterus with numerous fibroids, the largest being over 5 cm, and the patient was seen in gynecologic consultation by Dr. Teodora Medici, who confirmed the diagnosis of uterine fibroids with resulting menorrhagia.  As anticipated, the patient will undergo total abdominal hysterectomy subsequent to her craniotomy.  The patient was continued on Dilantin for her seizure treatment and has remained therapeutic and without seizures.  An additional problem found was that she was hypokalemic.  She was treated with potassium and  that has resolved.  Symptomatically, the patient has had some occasional headaches but no persistent or severe headaches, and she had no other complaints such as diplopia, blurred vision, weakness, language difficulties, or other neurologic dysfunction.  PAST MEDICAL HISTORY, FAMILY HISTORY, SOCIAL HISTORY, and REVIEW OF SYSTEMS: Details are unchanged as compared to her admission note of December 29, 1999.  PHYSICAL EXAMINATION:  GENERAL: The patient is a well-developed, well-nourished black female in no acute distress.  VITAL SIGNS: Temperature 97 degrees, pulse 90, blood pressure 170/90, respiratory rate 16.  Height 5 feet 3 inches.  Weight 219 pounds.  CHEST: Lungs clear to auscultation.  Chest symmetrical.  CARDIAC: Regular rate and rhythm, normal S1 and S2, no murmur.  ABDOMEN: Soft, nontender.  Bowel sounds present.  EXTREMITIES: No clubbing, cyanosis, or edema.  NEUROLOGIC: The patient is awake and alert and oriented to her name, [facility], and November 2001.  Speech is fluent and she has good comprehension.  Pupils 3 mm bilaterally and round and reactive to light. Extraocular movements intact.  Facial sensation intact.  Facial movement symmetric.  Hearing intact.  Palatal movement symmetric.  Shoulder shrug symmetric.  Tongue protrudes in midline and well to either side.  Motor examination shows 5/5 strength in the upper and lower extremities.  No drift noted to the upper extremities.  Sensory examination shows intact sensation to pinprick and touch.  Reflexes are symmetric.  IMPRESSION: Right sphenoid wing meningioma 5 cm in diameter, with surrounding cerebral edema, significant overall mass effect  and midline shift.  The right internal carotid artery is encased by tumor.  This patient initially presented with new onset seizure.  She has had no further seizure activity, with seizure activity having been controlled by Dilantin.  PLAN: The patient is being  admitted for right frontotemporal craniotomy, exposure of the right extracranial carotid artery, and resection of tumor with Stealth stereotaxis, and Cavitron ultrasonic aspirator.  We discussed the nature of her lesion, the nature of surgery, the risks of lesion and of surgery at length with the patient, including risks of infection, bleeding, possible neurologic dysfunction including loss of vision, paralysis, coma, and death.  We discussed anesthetic risks of myocardial infarction, stroke, pneumonia, and death.  She also understands that we anticipate a subtotal resection due to the encasement of the carotid artery and invasion of the cavernous sinus and optic canal.  Understanding all of this the patient does wish to proceed with surgery and is admitted for such. DD:  01/08/00 TD:  01/08/00 Job: [id number]

## 2010-07-20 NOTE — H&P (Signed)
[facility]  Patient:    Deborah Gallegos, Deborah Gallegos                    MRN: [id number] Proc. Date: 08/07/00 Adm. Date:  [id number] Attending:  Teodora Medici Cabitt CC:         Hewitt Shorts, M.D.   History and Physical  ADMITTING DIAGNOSIS:  Fibroid uterus.  HISTORY OF PRESENT ILLNESS:  The patient is a 60 year old gravida 3, para 3, black female admitted with a fibroid uterus and menorrhagia for total abdominal hysterectomy and bilateral salpingo-oophorectomy.  The patient was kindly referred by Dr. Shirlean Kelly for evaluation and treatment of fibroids, menorrhagia, and anemia.  The patient had not had gynecological care for 10 years prior to being seen in January 2002.  At that time _________ examination revealed multiple fibroids.  The patient complained of very heavy periods with frequent flooding and anemia when not treated with iron.  The patient denies significant pelvic pain or pressure and has not been sexually active for over one year.  A total abdominal hysterectomy and bilateral salpingo-oophorectomy have been discussed with the patient in detail.  The potential complications including, but not limited to, anesthesia, injury to the bowel, bladder, or ureters, and possible fistula formation, possible blood loss with transfusion, and its sequelae, and possible infection have been discussed with the patient in detail.  The increased risks associated with obesity have also been reviewed with the patient.  Postoperative expectations and restrictions have been reviewed and the patient understands that the cervix may be left if surgically indicated.  Postoperative hormone replacement therapy has also been discussed.  PAST MEDICAL HISTORY:  Surgical:  Craniotomy with removal of meningioma, cesarean section, removal of kidney stone.  Medical:  Hypertension.  MEDICATIONS:  Maxzide, Dilantin, and iron.  ALLERGIES:  None known.  SOCIAL HISTORY:  The  patient is a third grade teacher, recently widowed, with very supportive children.  FAMILY HISTORY:  Negative for carcinoma.  PHYSICAL EXAMINATION:  HEENT:  Negative.  LUNGS:  Clear.  HEART:  Without murmurs.  ABDOMEN:  Obese and thick.  PELVIC:  Reveals the BUS, vagina, and cervix to be normal.  The uterus is approximately 20 weeks in size with palpable thyroids that appear to be mostly central.  Adnexae without palpable masses.  RECTAL:  Negative.  EXTREMITIES:  Negative.  IMPRESSION: 1. Fibroid uterus. 2. Menorrhagia. 3. Anemia. 4. Hypertension.  PLAN:  Total abdominal hysterectomy and bilateral salpingo-oophorectomy. DD:  08/07/00 TD:  08/07/00 Job: [id number]

## 2012-07-14 ENCOUNTER — Emergency Department (HOSPITAL_COMMUNITY)
Admission: EM | Admit: 2012-07-14 | Discharge: 2012-07-14 | Disposition: A | Discharge status: Home / Self Care | Payer: BC Managed Care – PPO | Source: Home / Self Care

## 2012-07-14 ENCOUNTER — Encounter (HOSPITAL_COMMUNITY): Payer: Self-pay

## 2012-07-14 DIAGNOSIS — R51 Headache: Secondary | ICD-10-CM

## 2012-07-14 DIAGNOSIS — S0990XA Unspecified injury of head, initial encounter: Secondary | ICD-10-CM

## 2012-07-14 DIAGNOSIS — W1809XA Striking against other object with subsequent fall, initial encounter: Secondary | ICD-10-CM

## 2012-07-14 DIAGNOSIS — R42 Dizziness and giddiness: Secondary | ICD-10-CM

## 2012-07-14 HISTORY — DX: Essential (primary) hypertension: I10

## 2012-07-14 HISTORY — DX: Unspecified convulsions: R56.9

## 2012-07-14 NOTE — ED Provider Notes (Signed)
History     CSN: [id number]  Arrival date & time 07/14/12  1333   First MD Initiated Contact with Patient 07/14/12 1505      Chief Complaint  Patient presents with  . Fall    (Consider location/radiation/quality/duration/timing/severity/associated sxs/prior treatment) HPI Comments: Pleasant, morbidly obese 62 year old female who experienced mechanical fall approximately 11:30 this morning. She laid on the floor for a while and did not want to get up because she thought she might be dizzy. She called her son to come over and help her. The door was locked and she was able to get up on her own in a locked the door. She apparently struck her head on the corner of the sofa and she has soreness in her scalp near the left posterior vertex. She had a headache described as a "dull ache" that radiated bilaterally. She states that she had some lightheadedness and change in balance associated with transient mild nausea for about an hour after the experience. During the wait in the urgent care she states most of her symptoms have abated. The only symptom she has now is scalp tenderness and a milder dull headache. She is able to arise from a lying position to sitting and doing sitting to standing. Her gait in the room is smooth and balanced she has no nausea, dizziness or other symptoms. She denies loss of consciousness, problems with vision, speech, hearing, or swallowing. Denies focal neurologic symptoms such as usual lateral numbness or weakness. Her speech is clear with normal content. No alert and oriented.   Past Medical History  Diagnosis Date  . Seizure   . Hypertension     Past Surgical History  Procedure Laterality Date  . Abdominal hysterectomy    . Brain tumor excision      History reviewed. No pertinent family history.  History  Substance Use Topics  . Smoking status: Not on file  . Smokeless tobacco: Not on file  . Alcohol Use: Not on file    OB History   Grav Para Term  Preterm Abortions TAB SAB Ect Mult Living                  Review of Systems  Constitutional: Negative for fever, diaphoresis, activity change and fatigue.  HENT: Negative for hearing loss, ear pain, congestion, sore throat, rhinorrhea, mouth sores, voice change and ear discharge.   Eyes: Negative.   Respiratory: Negative for apnea, cough, choking, shortness of breath and wheezing.   Cardiovascular: Negative for chest pain, palpitations and leg swelling.  Gastrointestinal: Positive for nausea. Negative for abdominal pain.  Genitourinary: Negative.   Musculoskeletal: Negative for back pain and arthralgias.       Complains of posterior neck discomfort along the medial aspects of the trapezii. Denies spinal pain.  Skin: Negative.   Neurological: Positive for dizziness and headaches. Negative for tremors, seizures, syncope, facial asymmetry, speech difficulty, weakness and numbness.  Hematological: Negative.   Psychiatric/Behavioral: Negative.     Allergies  Review of patient's allergies indicates not on file.  Home Medications   Current Outpatient Rx  Name  Route  Sig  Dispense  Refill  . amLODipine-benazepril (LOTREL) 5-20 MG per capsule   Oral   Take 1 capsule by mouth daily.         Marland Kitchen aspirin 81 MG tablet   Oral   Take 81 mg by mouth daily.         . hydrochlorothiazide (HYDRODIURIL) 50 MG tablet   Oral  Take 50 mg by mouth daily.         Marland Kitchen lamoTRIgine (LAMICTAL) 200 MG tablet   Oral   Take 200 mg by mouth 2 (two) times daily.         Marland Kitchen levETIRAcetam (KEPPRA) 500 MG tablet   Oral   Take 500 mg by mouth every 12 (twelve) hours.         . rosuvastatin (CRESTOR) 10 MG tablet   Oral   Take 10 mg by mouth daily.           BP 155/63  Pulse 81  Temp(Src) 98.8 F (37.1 C) (Oral)  Resp 19  SpO2 95%  Physical Exam  Nursing note and vitals reviewed. Constitutional: She is oriented to person, place, and time. She appears well-developed and  well-nourished. No distress.  Morbidly obese.  HENT:  Head: Normocephalic.  Right Ear: External ear normal.  Left Ear: External ear normal.  Nose: Nose normal.  Mouth/Throat: Oropharynx is clear and moist. No oropharyngeal exudate.  Eyes: Conjunctivae and EOM are normal. Pupils are equal, round, and reactive to light.  Neck: Normal range of motion. Neck supple.  Cardiovascular: Normal rate, regular rhythm and normal heart sounds.   Pulmonary/Chest: Effort normal and breath sounds normal. No respiratory distress. She has no wheezes.  Abdominal: Soft. Bowel sounds are normal.  Musculoskeletal: Normal range of motion. She exhibits no edema.  Mild tenderness in the lower paracervical musculature. Full range of motion of the neck without pain or limitation.  Lymphadenopathy:    She has no cervical adenopathy.  Neurological: She is alert and oriented to person, place, and time. She displays no tremor. No cranial nerve deficit or sensory deficit. She exhibits normal muscle tone. She displays a negative Romberg sign. She displays no seizure activity. Gait abnormal. Coordination normal.  Skin: Skin is warm and dry.  Psychiatric: She has a normal mood and affect. Her behavior is normal. Judgment and thought content normal.    ED Course  Procedures (including critical care time)  Labs Reviewed - No data to display No results found.   1. Fall against object, initial encounter   2. Head trauma, initial encounter   3. Headache   4. Dizziness       MDM  The patient is discharged home in a stable and improved condition. The symptoms in which she experienced initially after the fall have nearly abated. The only symptom left is soreness of the scalp and a dull bilateral headache. Her dizziness has abated, her gait is smooth and balanced and she states she feels better. She is not taking an anticoagulant and she did not lose consciousness. There is no apparent injury to the scalp.  her neuro exam  is unremarkable. May take Tylenol for headache. For any worsening new symptoms or problems such as those we discussed in that are listed on your head injury sheet they are to the emergency department promptly.         Hayden Rasmussen, NP 07/14/12 1534

## 2012-07-14 NOTE — ED Notes (Signed)
States she turned to get something, and shoe snagged carpet, causing her to fall this AM; state she struck her head. Unsure if had LOC , denies LOC. Small hematoma note at area of pain. Felt like something running in ear ( no drainage noted)

## 2012-07-14 NOTE — ED Notes (Signed)
Reassured, comfort measures, warm blanket

## 2012-07-17 NOTE — ED Provider Notes (Signed)
Medical screening examination/treatment/procedure(s) were performed by resident physician or non-physician practitioner and as supervising physician I was immediately available for consultation/collaboration.   KINDL,JAMES DOUGLAS MD.   James D Kindl, MD 07/17/12 1002 

## 2012-12-27 ENCOUNTER — Emergency Department (HOSPITAL_COMMUNITY)
Admission: EM | Admit: 2012-12-27 | Discharge: 2012-12-27 | Disposition: A | Discharge status: Home / Self Care | Payer: BC Managed Care – PPO | Attending: Emergency Medicine | Admitting: Emergency Medicine

## 2012-12-27 ENCOUNTER — Emergency Department (HOSPITAL_COMMUNITY): Payer: BC Managed Care – PPO

## 2012-12-27 ENCOUNTER — Encounter (HOSPITAL_COMMUNITY): Payer: Self-pay | Admitting: Emergency Medicine

## 2012-12-27 DIAGNOSIS — R110 Nausea: Secondary | ICD-10-CM | POA: Insufficient documentation

## 2012-12-27 DIAGNOSIS — R51 Headache: Secondary | ICD-10-CM | POA: Insufficient documentation

## 2012-12-27 DIAGNOSIS — Z9889 Other specified postprocedural states: Secondary | ICD-10-CM | POA: Insufficient documentation

## 2012-12-27 DIAGNOSIS — Z7982 Long term (current) use of aspirin: Secondary | ICD-10-CM | POA: Insufficient documentation

## 2012-12-27 DIAGNOSIS — Z79899 Other long term (current) drug therapy: Secondary | ICD-10-CM | POA: Insufficient documentation

## 2012-12-27 DIAGNOSIS — G40909 Epilepsy, unspecified, not intractable, without status epilepticus: Secondary | ICD-10-CM | POA: Insufficient documentation

## 2012-12-27 DIAGNOSIS — I10 Essential (primary) hypertension: Secondary | ICD-10-CM | POA: Insufficient documentation

## 2012-12-27 DIAGNOSIS — R42 Dizziness and giddiness: Secondary | ICD-10-CM | POA: Insufficient documentation

## 2012-12-27 LAB — CBC WITH DIFFERENTIAL/PLATELET
Basophils Absolute: 0 10*3/uL (ref 0.0–0.1)
Eosinophils Relative: 0 % (ref 0–5)
HCT: 40.9 % (ref 36.0–46.0)
Lymphocytes Relative: 9 % — ABNORMAL LOW (ref 12–46)
Lymphs Abs: 0.9 10*3/uL (ref 0.7–4.0)
MCH: 28.3 pg (ref 26.0–34.0)
MCV: 86.3 fL (ref 78.0–100.0)
Monocytes Absolute: 0.3 10*3/uL (ref 0.1–1.0)
RDW: 15.5 % (ref 11.5–15.5)
WBC: 9.4 10*3/uL (ref 4.0–10.5)

## 2012-12-27 LAB — COMPREHENSIVE METABOLIC PANEL
CO2: 26 mEq/L (ref 19–32)
Calcium: 9.8 mg/dL (ref 8.4–10.5)
Creatinine, Ser: 0.68 mg/dL (ref 0.50–1.10)
GFR calc Af Amer: >90 mL/min (ref >90)
GFR calc non Af Amer: >90 mL/min (ref >90)
Glucose, Bld: 104 mg/dL — ABNORMAL HIGH (ref 70–99)

## 2012-12-27 MED ORDER — MECLIZINE HCL 25 MG PO TABS
25.0000 mg | ORAL_TABLET | Freq: Once | ORAL | Status: AC
  Administered 2012-12-27: 25 mg via ORAL
  Filled 2012-12-27: qty 1

## 2012-12-27 MED ORDER — MECLIZINE HCL 50 MG PO TABS: 50.0000 mg | ORAL_TABLET | Freq: Three times a day (TID) | ORAL | Status: AC | PRN

## 2012-12-27 NOTE — ED Notes (Signed)
Bed: WU98 Expected date:  Expected time:  Means of arrival:  Comments: EMS vertigo

## 2012-12-27 NOTE — ED Provider Notes (Signed)
CSN: [id number]     Arrival date & time 12/27/12  1456 History   First MD Initiated Contact with Patient 12/27/12 1510     Chief Complaint  Patient presents with  . Dizziness    Also; nausea    HPI  There is a history of "dizziness". I asked her specifically to not use the term dizzy she describes an episode started at 9:30 this morning of a difficulty with balance. She states when she walks down the hall she has to hold on. She leaned over to pick up her shoes and lost her balance and up on the floor. No injury. No strike to the head. She was concerned because she thinks she may have taken an extra tablet of her Lamictal. Normally she takes for Her pills in the morning and 1 Lamictal. She said them out last night into the lid of one of her medication bottles. She didn't actually go in there. She states she remembers thinking that she would take up this morning but she does not think she did. I would've been about 7 AM for about 2 hours before her dizziness started. She is history of brain tumor that was resected 2 years ago. 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 She is follow up with her neurologist tomorrow and with her neurosurgeon with an MRI in 2 weeks. Her last dosage adjustment was on her Keppra this is slightly increased 6 weeks ago. She's not had episodes of vertigo since then. She's had a headache for the last 3-4 days it is mild. She's not having sinus pressure ear ringing hearing loss. Mild nausea but no vomiting with the vertigo today  Past Medical History  Diagnosis Date  . Seizure   . Hypertension    Past Surgical History  Procedure Laterality Date  . Abdominal hysterectomy    . Brain tumor excision     History reviewed. No pertinent family history. History  Substance Use Topics  . Smoking status: Never Smoker   . Smokeless tobacco: Not on file  . Alcohol Use: No   OB History   Grav Para Term  Preterm Abortions TAB SAB Ect Mult Living                 Review of Systems  Constitutional: Negative for fever, chills, diaphoresis, appetite change and fatigue.  HENT: Negative for mouth sores, sore throat and trouble swallowing.   Eyes: Negative for visual disturbance.  Respiratory: Negative for cough, chest tightness, shortness of breath and wheezing.   Cardiovascular: Negative for chest pain.  Gastrointestinal: Positive for nausea. Negative for vomiting, abdominal pain, diarrhea and abdominal distention.  Endocrine: Negative for polydipsia, polyphagia and polyuria.  Genitourinary: Negative for dysuria, frequency and hematuria.  Musculoskeletal: Negative for gait problem.  Skin: Negative for color change, pallor and rash.  Neurological: Positive for dizziness and headaches. Negative for syncope and light-headedness.  Hematological: Does not bruise/bleed easily.  Psychiatric/Behavioral: Negative for behavioral problems and confusion.    Allergies  Review of patient's allergies indicates no known allergies.  Home Medications   Current Outpatient Rx  Name  Route  Sig  Dispense  Refill  . amLODipine-benazepril (LOTREL) 5-20 MG per capsule   Oral   Take 1 capsule by mouth daily.         Marland Kitchen aspirin 81 MG tablet   Oral   Take 81 mg by mouth  daily.         . hydrochlorothiazide (HYDRODIURIL) 50 MG tablet   Oral   Take 50 mg by mouth daily.         Marland Kitchen lamoTRIgine (LAMICTAL) 200 MG tablet   Oral   Take 200 mg by mouth 2 (two) times daily.         Marland Kitchen levETIRAcetam (KEPPRA) 500 MG tablet   Oral   Take 500 mg by mouth every 12 (twelve) hours.         . rosuvastatin (CRESTOR) 10 MG tablet   Oral   Take 10 mg by mouth daily.          BP 152/85  Pulse 66  Temp(Src) 97.5 F (36.4 C) (Oral)  Resp 16  SpO2 97% Physical Exam  Constitutional: She is oriented to person, place, and time. She appears well-developed and well-nourished. No distress.  HENT:  Head:  Normocephalic.  Eyes: Conjunctivae are normal. Pupils are equal, round, and reactive to light. No scleral icterus.  No nystagmus at rest. When I sit her up she complains of spinning or feeling like she is moving around rather than around her. She has no inducible nystagmus with that either.  Neck: Normal range of motion. Neck supple. No thyromegaly present.  Cardiovascular: Normal rate and regular rhythm.  Exam reveals no gallop and no friction rub.   No murmur heard. Pulmonary/Chest: Effort normal and breath sounds normal. No respiratory distress. She has no wheezes. She has no rales.  Abdominal: Soft. Bowel sounds are normal. She exhibits no distension. There is no tenderness. There is no rebound.  Musculoskeletal: Normal range of motion.  Neurological: She is alert and oriented to person, place, and time.  Gait is not tested because of her vertigo. She has vertigo sitting upright.  Skin: Skin is warm and dry. No rash noted.  Psychiatric: She has a normal mood and affect. Her behavior is normal.    ED Course  Procedures (including critical care time) Labs Review Labs Reviewed  CBC WITH DIFFERENTIAL - Abnormal; Notable for the following:    Neutrophils Relative % 88 (*)    Neutro Abs 8.3 (*)    Lymphocytes Relative 9 (*)    All other components within normal limits  COMPREHENSIVE METABOLIC PANEL - Abnormal; Notable for the following:    Potassium 5.2 (*)    Glucose, Bld 104 (*)    All other components within normal limits   Imaging Review Ct Head Wo Contrast  12/27/2012   CLINICAL DATA:  Dizziness and nausea. Slow speech. Brain tumor incision 11 years ago.  EXAM: CT HEAD WITHOUT CONTRAST  TECHNIQUE: Contiguous axial images were obtained from the base of the skull through the vertex without intravenous contrast.  COMPARISON:  07/13/2010  FINDINGS: The dominant lobulation of the rim calcified right sphenoid wing meningioma measures 4.0 cm in AP dimension on today's exam, no change from  prior.  Evidence of prior right temporal craniotomy and underlying right frontotemporal encephalomalacia. This appears similar to the 2012 exam. The degree of mass effect of the meningioma on the pons is similar to prior.  Chronic microvascular white matter disease has slightly increased in the interim. No intracranial hemorrhage or acute CVA observed.  IMPRESSION: 1. No acute findings. 2. 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 3. Chronic microvascular white matter disease, slightly increased in the interim.   Electronically Signed   By: Herbie Baltimore M.D.   On: 12/27/2012  16:57    EKG Interpretation   None       MDM   1. Vertigo      describes as vertigo rather than orthostasis. The changes in her antiepileptics or what occurred in the kidney and liver function. With a history of brain tumor and headache we'll do CT scan. She is close follow up with neurology tomorrow and neurosurgeon and MRI in a few weeks. Given trial of by mouth meclizine here. I will reassess her following the above diagnostics.  She was improved. She is able to sit. She is able to stand. She does have some continued vertigo with ambulation but is able to do so independently. CT scan shows unchanged large area of encephalomalacia and tumor. Kidney and renal function shows no marked abnormalities. His appointment with her neurologist tomorrow thinks is appropriate for outpatient treatment and discharged tonight.      Roney Marion, MD 12/27/12 Zollie Pee

## 2012-12-27 NOTE — ED Notes (Signed)
She c/o periodic dizziness and nausea.  She has noted increased frequency of dizziness since the addition of Keppra to her anti-seizure regimen.  She is also on Lamictal; and perhaps inadvertently took an extra Lamictal today--not sure.  She is awake, oriented x 4 with clear, slow speech and is in no distress.

## 2013-01-06 ENCOUNTER — Ambulatory Visit: Payer: BC Managed Care – PPO | Attending: Family Medicine | Admitting: Physical Therapy

## 2013-01-06 DIAGNOSIS — IMO0001 Reserved for inherently not codable concepts without codable children: Secondary | ICD-10-CM | POA: Insufficient documentation

## 2013-01-06 DIAGNOSIS — R42 Dizziness and giddiness: Secondary | ICD-10-CM | POA: Insufficient documentation

## 2013-01-06 DIAGNOSIS — R269 Unspecified abnormalities of gait and mobility: Secondary | ICD-10-CM | POA: Insufficient documentation

## 2013-01-11 ENCOUNTER — Ambulatory Visit: Payer: BC Managed Care – PPO | Admitting: Physical Therapy

## 2013-01-13 ENCOUNTER — Ambulatory Visit: Payer: BC Managed Care – PPO | Admitting: Physical Therapy

## 2013-01-18 ENCOUNTER — Ambulatory Visit: Payer: BC Managed Care – PPO | Admitting: Physical Therapy

## 2013-01-20 ENCOUNTER — Ambulatory Visit: Payer: BC Managed Care – PPO | Admitting: Physical Therapy

## 2013-01-25 ENCOUNTER — Ambulatory Visit: Payer: BC Managed Care – PPO | Admitting: Physical Therapy

## 2013-01-27 ENCOUNTER — Ambulatory Visit: Payer: BC Managed Care – PPO | Admitting: Physical Therapy

## 2013-02-01 ENCOUNTER — Ambulatory Visit: Payer: BC Managed Care – PPO | Attending: Family Medicine | Admitting: Physical Therapy

## 2013-02-01 DIAGNOSIS — IMO0001 Reserved for inherently not codable concepts without codable children: Secondary | ICD-10-CM | POA: Insufficient documentation

## 2013-02-01 DIAGNOSIS — R269 Unspecified abnormalities of gait and mobility: Secondary | ICD-10-CM | POA: Insufficient documentation

## 2013-02-01 DIAGNOSIS — R42 Dizziness and giddiness: Secondary | ICD-10-CM | POA: Insufficient documentation

## 2013-02-04 ENCOUNTER — Ambulatory Visit: Payer: BC Managed Care – PPO | Admitting: Physical Therapy

## 2013-02-09 ENCOUNTER — Ambulatory Visit: Payer: BC Managed Care – PPO | Admitting: Physical Therapy

## 2013-02-15 ENCOUNTER — Encounter: Payer: BC Managed Care – PPO | Admitting: Physical Therapy

## 2013-02-16 ENCOUNTER — Ambulatory Visit: Payer: BC Managed Care – PPO | Admitting: Physical Therapy

## 2013-02-17 ENCOUNTER — Encounter: Payer: BC Managed Care – PPO | Admitting: Physical Therapy

## 2013-03-01 ENCOUNTER — Ambulatory Visit: Payer: BC Managed Care – PPO | Admitting: Physical Therapy

## 2013-03-02 ENCOUNTER — Ambulatory Visit: Payer: BC Managed Care – PPO | Admitting: Physical Therapy

## 2013-03-09 ENCOUNTER — Ambulatory Visit: Payer: BC Managed Care – PPO | Admitting: Physical Therapy

## 2013-03-11 ENCOUNTER — Ambulatory Visit: Payer: BC Managed Care – PPO | Attending: Family Medicine | Admitting: Physical Therapy

## 2013-03-11 DIAGNOSIS — R269 Unspecified abnormalities of gait and mobility: Secondary | ICD-10-CM | POA: Insufficient documentation

## 2013-03-11 DIAGNOSIS — R42 Dizziness and giddiness: Secondary | ICD-10-CM | POA: Insufficient documentation

## 2013-03-11 DIAGNOSIS — IMO0001 Reserved for inherently not codable concepts without codable children: Secondary | ICD-10-CM | POA: Insufficient documentation

## 2013-03-16 ENCOUNTER — Ambulatory Visit: Payer: BC Managed Care – PPO | Admitting: Physical Therapy

## 2013-03-18 ENCOUNTER — Ambulatory Visit: Payer: BC Managed Care – PPO | Admitting: Physical Therapy

## 2013-03-23 ENCOUNTER — Ambulatory Visit: Payer: BC Managed Care – PPO | Admitting: Physical Therapy

## 2013-03-25 ENCOUNTER — Ambulatory Visit: Payer: BC Managed Care – PPO | Admitting: Physical Therapy

## 2013-03-30 ENCOUNTER — Ambulatory Visit: Payer: BC Managed Care – PPO | Admitting: Physical Therapy

## 2013-04-01 ENCOUNTER — Ambulatory Visit: Payer: BC Managed Care – PPO | Admitting: Physical Therapy

## 2013-04-13 ENCOUNTER — Ambulatory Visit: Payer: BC Managed Care – PPO | Attending: Family Medicine | Admitting: Physical Therapy

## 2013-04-13 DIAGNOSIS — R269 Unspecified abnormalities of gait and mobility: Secondary | ICD-10-CM | POA: Insufficient documentation

## 2013-04-13 DIAGNOSIS — IMO0001 Reserved for inherently not codable concepts without codable children: Secondary | ICD-10-CM | POA: Insufficient documentation

## 2013-04-13 DIAGNOSIS — R42 Dizziness and giddiness: Secondary | ICD-10-CM | POA: Insufficient documentation

## 2013-04-20 ENCOUNTER — Ambulatory Visit: Payer: BC Managed Care – PPO | Admitting: Physical Therapy

## 2013-04-27 ENCOUNTER — Ambulatory Visit: Payer: BC Managed Care – PPO | Admitting: Physical Therapy

## 2013-05-04 ENCOUNTER — Ambulatory Visit: Payer: BC Managed Care – PPO | Attending: Family Medicine | Admitting: Physical Therapy

## 2013-05-04 DIAGNOSIS — R42 Dizziness and giddiness: Secondary | ICD-10-CM | POA: Insufficient documentation

## 2013-05-04 DIAGNOSIS — IMO0001 Reserved for inherently not codable concepts without codable children: Secondary | ICD-10-CM | POA: Insufficient documentation

## 2013-05-04 DIAGNOSIS — R269 Unspecified abnormalities of gait and mobility: Secondary | ICD-10-CM | POA: Insufficient documentation

## 2013-05-11 ENCOUNTER — Ambulatory Visit: Payer: BC Managed Care – PPO | Admitting: Physical Therapy

## 2013-05-18 ENCOUNTER — Ambulatory Visit: Payer: BC Managed Care – PPO | Admitting: Physical Therapy

## 2013-05-24 ENCOUNTER — Ambulatory Visit: Payer: BC Managed Care – PPO | Admitting: Physical Therapy

## 2013-05-25 ENCOUNTER — Ambulatory Visit: Payer: BC Managed Care – PPO | Admitting: Physical Therapy

## 2013-05-28 ENCOUNTER — Ambulatory Visit: Payer: BC Managed Care – PPO | Admitting: Physical Therapy

## 2013-06-01 ENCOUNTER — Ambulatory Visit: Payer: BC Managed Care – PPO | Admitting: Physical Therapy

## 2013-06-11 ENCOUNTER — Ambulatory Visit: Payer: BC Managed Care – PPO | Attending: Family Medicine | Admitting: Physical Therapy

## 2013-06-11 DIAGNOSIS — R269 Unspecified abnormalities of gait and mobility: Secondary | ICD-10-CM | POA: Insufficient documentation

## 2013-06-11 DIAGNOSIS — IMO0001 Reserved for inherently not codable concepts without codable children: Secondary | ICD-10-CM | POA: Diagnosis present

## 2013-06-11 DIAGNOSIS — R42 Dizziness and giddiness: Secondary | ICD-10-CM | POA: Insufficient documentation

## 2013-06-18 ENCOUNTER — Ambulatory Visit: Payer: BC Managed Care – PPO | Admitting: Physical Therapy

## 2013-06-25 ENCOUNTER — Ambulatory Visit: Payer: BC Managed Care – PPO | Admitting: Physical Therapy

## 2013-12-07 ENCOUNTER — Ambulatory Visit: Payer: BC Managed Care – PPO | Admitting: Physical Therapy

## 2013-12-07 ENCOUNTER — Ambulatory Visit: Payer: BC Managed Care – PPO | Attending: Family Medicine | Admitting: Physical Therapy

## 2013-12-07 DIAGNOSIS — Z5189 Encounter for other specified aftercare: Secondary | ICD-10-CM | POA: Insufficient documentation

## 2013-12-07 DIAGNOSIS — R569 Unspecified convulsions: Secondary | ICD-10-CM | POA: Insufficient documentation

## 2013-12-07 DIAGNOSIS — R269 Unspecified abnormalities of gait and mobility: Secondary | ICD-10-CM | POA: Diagnosis not present

## 2013-12-07 DIAGNOSIS — Z8673 Personal history of transient ischemic attack (TIA), and cerebral infarction without residual deficits: Secondary | ICD-10-CM | POA: Insufficient documentation

## 2013-12-15 ENCOUNTER — Ambulatory Visit: Payer: BC Managed Care – PPO

## 2013-12-15 DIAGNOSIS — Z5189 Encounter for other specified aftercare: Secondary | ICD-10-CM | POA: Diagnosis not present

## 2013-12-17 ENCOUNTER — Ambulatory Visit: Payer: BC Managed Care – PPO

## 2013-12-17 DIAGNOSIS — Z5189 Encounter for other specified aftercare: Secondary | ICD-10-CM | POA: Diagnosis not present

## 2013-12-20 ENCOUNTER — Ambulatory Visit: Payer: BC Managed Care – PPO | Admitting: Physical Therapy

## 2013-12-20 DIAGNOSIS — Z5189 Encounter for other specified aftercare: Secondary | ICD-10-CM | POA: Diagnosis not present

## 2013-12-27 ENCOUNTER — Ambulatory Visit: Payer: BC Managed Care – PPO | Admitting: Physical Therapy

## 2013-12-27 DIAGNOSIS — Z5189 Encounter for other specified aftercare: Secondary | ICD-10-CM | POA: Diagnosis not present

## 2013-12-30 ENCOUNTER — Encounter: Payer: BC Managed Care – PPO | Admitting: Physical Therapy

## 2014-01-03 ENCOUNTER — Ambulatory Visit: Payer: BC Managed Care – PPO | Admitting: Physical Therapy

## 2014-01-06 ENCOUNTER — Ambulatory Visit: Payer: BC Managed Care – PPO | Attending: Family Medicine | Admitting: Physical Therapy

## 2014-01-06 DIAGNOSIS — R269 Unspecified abnormalities of gait and mobility: Secondary | ICD-10-CM | POA: Insufficient documentation

## 2014-01-06 DIAGNOSIS — Z8673 Personal history of transient ischemic attack (TIA), and cerebral infarction without residual deficits: Secondary | ICD-10-CM | POA: Insufficient documentation

## 2014-01-06 DIAGNOSIS — Z5189 Encounter for other specified aftercare: Secondary | ICD-10-CM | POA: Insufficient documentation

## 2014-01-06 DIAGNOSIS — R569 Unspecified convulsions: Secondary | ICD-10-CM | POA: Insufficient documentation

## 2014-01-10 ENCOUNTER — Ambulatory Visit: Payer: BC Managed Care – PPO | Admitting: Physical Therapy

## 2014-01-13 ENCOUNTER — Ambulatory Visit: Payer: BC Managed Care – PPO | Admitting: Physical Therapy

## 2014-01-13 ENCOUNTER — Encounter: Payer: Self-pay | Admitting: Physical Therapy

## 2014-01-13 DIAGNOSIS — Z8673 Personal history of transient ischemic attack (TIA), and cerebral infarction without residual deficits: Secondary | ICD-10-CM | POA: Diagnosis not present

## 2014-01-13 DIAGNOSIS — R569 Unspecified convulsions: Secondary | ICD-10-CM | POA: Diagnosis not present

## 2014-01-13 DIAGNOSIS — R269 Unspecified abnormalities of gait and mobility: Secondary | ICD-10-CM | POA: Diagnosis not present

## 2014-01-13 DIAGNOSIS — Z5189 Encounter for other specified aftercare: Secondary | ICD-10-CM | POA: Diagnosis present

## 2014-01-13 NOTE — Therapy (Signed)
Physical Therapy Treatment  Patient Details  Name: ELAJAH KUNSMAN MRN: [id number] Date of Birth: [birth year]  Encounter Date: 01/13/2014      PT End of Session - 01/13/14 1734    Visit Number 6   Number of Visits 16   Date for PT Re-Evaluation 01/20/14   PT Start Time 1450   PT Stop Time 1535   PT Time Calculation (min) 45 min   Activity Tolerance Patient tolerated treatment well      Past Medical History  Diagnosis Date  . Seizure   . Hypertension     Past Surgical History  Procedure Laterality Date  . Abdominal hysterectomy    . Brain tumor excision      There were no vitals taken for this visit.  Visit Diagnosis:  Abnormality of gait      Subjective Assessment - 01/13/14 1723    Symptoms pt. states she feels really tired today - doesn't know why; has been using RW almost constantly in the home   Currently in Pain? No/denies            [facility] Adult PT Treatment/Exercise - 01/13/14 1725    Transfers   Transfers Sit to Stand  no UE support x 10 resp   Ambulation/Gait   Ambulation/Gait Yes   Ambulation/Gait Assistance Other (comment)  SBA to CGA   Ambulation Distance (Feet) 120 Feet   Assistive device Straight cane  pt. using RW in home and community   High Level Balance   High Level Balance Activities Side stepping;Marching forwards  along countertop with CGA   High Level Balance Comments standing in partial tandem stance with head turns with CGA:  side kicks x 10 reps , forward and backward kicks x 10 reps;  stepping over and back of cane for imrpoved single limb stance with UE support prn with CGA   Exercises   Exercises Knee/Hip   Lumbar Exercises: Standing   Heel Raises 10 reps   Other Standing Lumbar Exercises 3# weight used for bil. hip flexion, extension and abduction x 10 reps each   Knee/Hip Exercises: Aerobic   Stationary Bike 5'  Nustep level 3 with UE's and LE's          PT Education - 01/13/14 1733    Education provided Yes   Education Details instructed to continue with balance HEP   Person(s) Educated Patient   Methods Explanation   Comprehension Verbalized understanding          PT Short Term Goals - 01/13/14 1738    PT SHORT TERM GOAL #1   Title Improve Berg score by at least 4 points   Time 1  01-20-14   Period Weeks   Status On-going   PT SHORT TERM GOAL #2   Title Incr. gait velocity to >/= 1.9 ft/sec with cane   Time 1  01-20-14   Period Weeks   Status On-going   PT SHORT TERM GOAL #3   Title Amb. in home 67' with cane without LOB   Time 1  01-20-14   Period Weeks   Status On-going   PT SHORT TERM GOAL #4   Title Improve TUG </= 15 secs with no device   Time 1  01-20-14   Period Weeks   Status On-going          PT Long Term Goals - 01/13/14 1740    PT LONG TERM GOAL #1   Title Verbalize understanding of fall prevention strategies in  home environment   Time 3  02-05-14   Period Weeks   Status On-going   PT LONG TERM GOAL #2   Title incr. Berg score by at least 8 points   Baseline 02-05-14   Time 3   Period Weeks   Status On-going   PT LONG TERM GOAL #3   Title incr. gait velocity to >/= 2.1 ft/sec with cane   Baseline 02-05-14   Time 3   Period Weeks   Status On-going   PT LONG TERM GOAL #4   Title Amb. 250' with cane with SBA    Baseline 02-05-14   Time 3   Period Weeks   Status On-going   PT LONG TERM GOAL #5   Title Improve TUG score to </= 13.5 secs without device   Baseline 02-05-14   Time 3   Period Weeks   Status On-going          Plan - 01/13/14 1736    Clinical Impression Statement pt.'s gait is safer with cues to slow down, achieve initial heel contact at stance and to stand erect   Pt will benefit from skilled therapeutic intervention in order to improve on the following deficits Abnormal gait;Decreased endurance;Decreased activity tolerance;Decreased balance;Decreased strength   Rehab Potential Good   PT Frequency 2x / week   PT Duration 8 weeks    PT Treatment/Interventions Therapeutic activities;Patient/family education;Therapeutic exercise;Gait training;Balance training;Neuromuscular re-education;Stair training   PT Next Visit Plan continue with balance and strengthening exercises   PT Home Exercise Plan Balance HEP as instructed previously   Consulted and Agree with Plan of Care Patient        Problem List There are no active problems to display for this patient.                                          Guido Sander, [place] [address], [place],  [address] [phone]    Tova, Vater 01/13/2014, 5:46 PM

## 2014-01-31 ENCOUNTER — Ambulatory Visit: Payer: BC Managed Care – PPO | Admitting: Physical Therapy

## 2014-01-31 ENCOUNTER — Encounter: Payer: Self-pay | Admitting: Physical Therapy

## 2014-01-31 DIAGNOSIS — R269 Unspecified abnormalities of gait and mobility: Secondary | ICD-10-CM

## 2014-01-31 DIAGNOSIS — Z5189 Encounter for other specified aftercare: Secondary | ICD-10-CM | POA: Diagnosis not present

## 2014-01-31 NOTE — Therapy (Signed)
Physical Therapy Treatment  Patient Details  Name: Deborah Gallegos MRN: [id number] Date of Birth: 1950/04/28  Encounter Date: 01/31/2014      PT End of Session - 01/31/14 2042    Visit Number 7   Number of Visits 16   Date for PT Re-Evaluation 02/05/14   PT Start Time 1150   PT Stop Time 1237   PT Time Calculation (min) 47 min      Past Medical History  Diagnosis Date  . Seizure   . Hypertension     Past Surgical History  Procedure Laterality Date  . Abdominal hysterectomy    . Brain tumor excision      There were no vitals taken for this visit.  Visit Diagnosis:  Abnormality of gait      Subjective Assessment - 01/31/14 2031    Symptoms Pt. reports she is doing much better with recovery of LOB; states she is now able to amb. more with use of cane rather than RW   Currently in Pain? No/denies            [facility] Adult PT Treatment/Exercise - 01/31/14 1206    Transfers   Transfers Sit to Stand  5 reps   Ambulation/Gait   Gait velocity 1.91   Dynamic Standing Balance   Dynamic Standing - Balance Support No upper extremity supported  performed inside bars - rockerboard anteriorly/posteriorly   Standardized Balance Assessment   Standardized Balance Assessment Berg Balance Test;Timed Up and Go Test   Berg Balance Test   Sit to Stand Able to stand without using hands and stabilize independently   Standing Unsupported Able to stand safely 2 minutes   Sitting with Back Unsupported but Feet Supported on Floor or Stool Able to sit safely and securely 2 minutes   Stand to Sit Sits safely with minimal use of hands   Transfers Able to transfer safely, minor use of hands   Standing Unsupported with Eyes Closed Able to stand 10 seconds with supervision   Standing Ubsupported with Feet Together Able to place feet together independently and stand for 1 minute with supervision   From Standing, Reach Forward with Outstretched Arm Can reach forward >12 cm safely (5")   From Standing Position, Pick up Object from Floor Able to pick up shoe, needs supervision  8"   From Standing Position, Turn to Look Behind Over each Shoulder Looks behind from both sides and weight shifts well   Turn 360 Degrees Able to turn 360 degrees safely but slowly  right=6.16  left=7.37   Standing Unsupported, Alternately Place Feet on Step/Stool Able to stand independently and complete 8 steps >20 seconds   Standing Unsupported, One Foot in Front Able to plae foot ahead of the other independently and hold 30 seconds   Standing on One Leg Tries to lift leg/unable to hold 3 seconds but remains standing independently   Total Score 45   Timed Up and Go Test   TUG Normal TUG   Normal TUG (seconds) 13.41   High Level Balance   High Level Balance Activities Side stepping  crossovers in front inside bars with UE support   Knee/Hip Exercises: Aerobic   Stationary Bike Nustep level 3 10"   Knee/Hip Exercises: Standing   Forward Step Up 10 reps;Step Height: 6";Both  with UE support for balance            PT Short Term Goals - 01/31/14 2047    PT SHORT TERM GOAL #1  Title Improve Berg score by at least 4 points   Time --  01-31-14   Status Not Met  score incr. by 2 points   PT SHORT TERM GOAL #2   Title Incr. gait velocity to >/= 1.9 ft/sec with cane   Time --  assessed 01-31-14   Period Days   Status Achieved   PT SHORT TERM GOAL #3   Title Amb. in home 10' with cane without LOB   Time --  assessed 01-31-14   Period Days   Status Achieved   PT SHORT TERM GOAL #4   Title Improve TUG </= 15 secs with no device   Time --  01-31-14   Status Achieved            Plan - 01/31/14 2044    Clinical Impression Statement Pt. is improving with balance and gait; able to stand more erect with cues to decrease gait speed; pt. met 3 of 4 LTG's   Pt will benefit from skilled therapeutic intervention in order to improve on the following deficits Abnormal gait;Decreased  endurance;Decreased activity tolerance;Decreased balance;Decreased strength   Rehab Potential Good   PT Frequency 2x / week   PT Duration 4 weeks   PT Treatment/Interventions Therapeutic activities;Patient/family education;Therapeutic exercise;Gait training;Balance training;Neuromuscular re-education;Stair training   PT Next Visit Plan check LTG's and renew due to end of certification period   PT Home Exercise Plan Balance HEP as instructed previously   Consulted and Agree with Plan of Care Patient        Problem List There are no active problems to display for this patient.                                      Guido Sander, [place] [address], [place], [place] [address] [phone]        Deborah Gallegos, Deborah Gallegos 01/31/2014, 8:54 PM

## 2014-02-07 ENCOUNTER — Ambulatory Visit: Payer: BC Managed Care – PPO | Admitting: Physical Therapy

## 2014-02-08 ENCOUNTER — Ambulatory Visit: Payer: BC Managed Care – PPO | Admitting: Physical Therapy

## 2014-02-14 ENCOUNTER — Ambulatory Visit: Payer: BC Managed Care – PPO | Admitting: Physical Therapy

## 2014-02-21 ENCOUNTER — Ambulatory Visit: Payer: BC Managed Care – PPO | Attending: Family Medicine | Admitting: Physical Therapy

## 2014-02-21 ENCOUNTER — Encounter: Payer: Self-pay | Admitting: Physical Therapy

## 2014-02-21 DIAGNOSIS — Z5189 Encounter for other specified aftercare: Secondary | ICD-10-CM | POA: Insufficient documentation

## 2014-02-21 DIAGNOSIS — R269 Unspecified abnormalities of gait and mobility: Secondary | ICD-10-CM | POA: Diagnosis not present

## 2014-02-21 DIAGNOSIS — Z8673 Personal history of transient ischemic attack (TIA), and cerebral infarction without residual deficits: Secondary | ICD-10-CM | POA: Insufficient documentation

## 2014-02-21 DIAGNOSIS — R569 Unspecified convulsions: Secondary | ICD-10-CM | POA: Insufficient documentation

## 2014-02-21 NOTE — Therapy (Signed)
[facility] [address] [place], [place], [address] Phone: [phone]   Fax:  [phone]  Physical Therapy Treatment  Patient Details  Name: Deborah Gallegos MRN: [id number] Date of Birth: 1950-05-03  Encounter Date: 02/21/2014      PT End of Session - 02/21/14 2017    Visit Number 8   Number of Visits 16   Date for PT Re-Evaluation 03/03/14   PT Start Time 1110   PT Stop Time 1150   PT Time Calculation (min) 40 min   Equipment Utilized During Treatment Gait belt      Past Medical History  Diagnosis Date  . Seizure   . Hypertension     Past Surgical History  Procedure Laterality Date  . Abdominal hysterectomy    . Brain tumor excision      There were no vitals taken for this visit.  Visit Diagnosis:  Abnormality of gait      Subjective Assessment - 02/21/14 2012    Symptoms Pt. states she just got back from trip to [place]last Friday - rented a scooter there and did well; feels balance is improving   Currently in Pain? No/denies                    [facility] Adult PT Treatment/Exercise - 02/21/14 1120    High Level Balance   High Level Balance Activities Side stepping;Negotiating over obstacles;Direction changes   High Level Balance Comments pt. stood with narrow BOS with head turns with UE support prn with CGA   Knee/Hip Exercises: Stretches   Active Hamstring Stretch 30 seconds  bil. LE's - runner's stretch   Knee/Hip Exercises: Aerobic   Stationary Bike Nustep level 4 x 5"     Neuro-Re-ed: Pt. Stood on rockerboard and performed anterior/posterior weight shifts - added head turns horizontally with UE support prn with CGA;                          Performed stepping over and back of balance beam on floor with UE support prn - 10 reps each leg; tap ups to 4" step with minimal UE support  There Ex: Bil. Hip flexion, extension, abduction with red theraband x 10 reps each  leg            PT Short Term Goals - 01/31/14 2047    PT SHORT TERM GOAL #1   Title Improve Berg score by at least 4 points   Time --  01-31-14   Status Not Met  score incr. by 2 points   PT SHORT TERM GOAL #2   Title Incr. gait velocity to >/= 1.9 ft/sec with cane   Time --  assessed 01-31-14   Period Days   Status Achieved   PT SHORT TERM GOAL #3   Title Amb. in home 73' with cane without LOB   Time --  assessed 01-31-14   Period Days   Status Achieved   PT SHORT TERM GOAL #4   Title Improve TUG </= 15 secs with no device   Time --  01-31-14   Status Achieved           PT Long Term Goals - 01/13/14 1740    PT LONG TERM GOAL #1   Title Verbalize understanding of fall prevention strategies in home environment   Time 3  02-05-14   Period Weeks   Status On-going   PT LONG TERM  GOAL #2   Title incr. Berg score by at least 8 points   Baseline 02-05-14   Time 3   Period Weeks   Status On-going   PT LONG TERM GOAL #3   Title incr. gait velocity to >/= 2.1 ft/sec with cane   Baseline 02-05-14   Time 3   Period Weeks   Status On-going   PT LONG TERM GOAL #4   Title Amb. 250' with cane with SBA    Baseline 02-05-14   Time 3   Period Weeks   Status On-going   PT LONG TERM GOAL #5   Title Improve TUG score to </= 13.5 secs without device   Baseline 02-05-14   Time 3   Period Weeks   Status On-going               Problem List There are no active problems to display for this patient.   Babita, Amaker PT 02/21/2014, 8:20 PM  [facility] [address] [place], [place], [address] Phone: [phone]   Fax:  [phone]

## 2014-02-22 ENCOUNTER — Encounter: Payer: Self-pay | Admitting: Physical Therapy

## 2014-02-22 ENCOUNTER — Ambulatory Visit: Payer: BC Managed Care – PPO | Admitting: Physical Therapy

## 2014-02-22 DIAGNOSIS — R269 Unspecified abnormalities of gait and mobility: Secondary | ICD-10-CM

## 2014-02-22 DIAGNOSIS — Z5189 Encounter for other specified aftercare: Secondary | ICD-10-CM | POA: Diagnosis not present

## 2014-02-22 NOTE — Therapy (Signed)
[facility] [address] [place], [place], [address] Phone: [phone]   Fax:  [phone]  Physical Therapy Treatment  Patient Details  Name: Deborah Gallegos MRN: [id number] Date of Birth: 1951-01-26  Encounter Date: 02/22/2014      PT End of Session - 02/22/14 1305    Visit Number 9   Number of Visits 16   Date for PT Re-Evaluation 03/03/14   PT Start Time 1150   PT Stop Time 1237   PT Time Calculation (min) 47 min      Past Medical History  Diagnosis Date  . Seizure   . Hypertension     Past Surgical History  Procedure Laterality Date  . Abdominal hysterectomy    . Brain tumor excision      There were no vitals taken for this visit.  Visit Diagnosis:  Abnormality of gait      Subjective Assessment - 02/22/14 1250    Symptoms Pt. reports doing well today; is not using cane; states she saw a friend the other day who commented that she was walking better   Currently in Pain? No/denies                    [facility] Adult PT Treatment/Exercise - 02/22/14 1253    Transfers   Transfers Sit to Stand   Sit to Stand 5: Supervision;Without upper extremity assist  on blue foam x 10 reps   Ambulation/Gait   Ambulation/Gait Yes   Ambulation/Gait Assistance 5: Supervision   Ambulation/Gait Assistance Details needs cues for initial heel contact bil. LE's   Ambulation Distance (Feet) 120 Feet   Assistive device None  holding ball overhead to fascilitate upright posture   Stairs Yes   Stair Management Technique Forwards;Two rails;Alternating pattern   Number of Stairs 4   Height of Stairs 6   High Level Balance   High Level Balance Comments tapping down to floor from blue foam with CGA; partial squats standing on blue foam x 10 reps; stepping over and back of balance beam without UE support x 10 reps each leg   Lumbar Exercises: Standing   Heel Raises 10 reps   Other Standing Lumbar Exercises  bil. hip extension and abduction with green theraband x 10 reps; ankle sways inside bars without UE support x 10 reps   Knee/Hip Exercises: Aerobic   Stationary Bike Nustep level 3 x 11"   Knee/Hip Exercises: Standing   Forward Step Up 10 reps;Step Height: 6"  10 reps each leg                  PT Short Term Goals - 01/31/14 2047    PT SHORT TERM GOAL #1   Title Improve Berg score by at least 4 points   Time --  01-31-14   Status Not Met  score incr. by 2 points   PT SHORT TERM GOAL #2   Title Incr. gait velocity to >/= 1.9 ft/sec with cane   Time --  assessed 01-31-14   Period Days   Status Achieved   PT SHORT TERM GOAL #3   Title Amb. in home 82' with cane without LOB   Time --  assessed 01-31-14   Period Days   Status Achieved   PT SHORT TERM GOAL #4   Title Improve TUG </= 15 secs with no device   Time --  01-31-14   Status Achieved  PT Long Term Goals - 01/13/14 1740    PT LONG TERM GOAL #1   Title Verbalize understanding of fall prevention strategies in home environment   Time 3  02-05-14   Period Weeks   Status On-going   PT LONG TERM GOAL #2   Title incr. Berg score by at least 8 points   Baseline 02-05-14   Time 3   Period Weeks   Status On-going   PT LONG TERM GOAL #3   Title incr. gait velocity to >/= 2.1 ft/sec with cane   Baseline 02-05-14   Time 3   Period Weeks   Status On-going   PT LONG TERM GOAL #4   Title Amb. 250' with cane with SBA    Baseline 02-05-14   Time 3   Period Weeks   Status On-going   PT LONG TERM GOAL #5   Title Improve TUG score to </= 13.5 secs without device   Baseline 02-05-14   Time 3   Period Weeks   Status On-going               Plan - 02/22/14 1305    Clinical Impression Statement Pt. is progressing towards LTG's - demonstrating improvement in gait without use of cane for community ambulation   Pt will benefit from skilled therapeutic intervention in order to improve on the  following deficits Abnormal gait;Decreased endurance;Decreased activity tolerance;Decreased balance;Decreased strength   Rehab Potential Good   PT Frequency 2x / week   PT Duration 4 weeks   PT Treatment/Interventions Therapeutic activities;Patient/family education;Therapeutic exercise;Gait training;Balance training;Neuromuscular re-education;Stair training   PT Next Visit Plan check LTG's and renew due to end of certification period   Consulted and Agree with Plan of Care Patient        Problem List There are no active problems to display for this patient.   Leandra, Vanderweele, PT 02/22/2014, 1:14 PM  [facility] [address] [place], [place], [address] Phone: [phone]   Fax:  [phone]

## 2014-02-23 ENCOUNTER — Ambulatory Visit: Payer: BC Managed Care – PPO | Admitting: Physical Therapy

## 2014-02-28 ENCOUNTER — Ambulatory Visit: Payer: BC Managed Care – PPO | Admitting: Physical Therapy

## 2014-02-28 ENCOUNTER — Encounter: Payer: Self-pay | Admitting: Physical Therapy

## 2014-02-28 DIAGNOSIS — Z5189 Encounter for other specified aftercare: Secondary | ICD-10-CM | POA: Diagnosis not present

## 2014-02-28 DIAGNOSIS — R269 Unspecified abnormalities of gait and mobility: Secondary | ICD-10-CM

## 2014-02-28 NOTE — Therapy (Signed)
[facility] [address] [place], [place], [address] Phone: [phone]   Fax:  [phone]  Physical Therapy Treatment  Patient Details  Name: Deborah Gallegos MRN: [id number] Date of Birth: January 12, 1951  Encounter Date: 02/28/2014      PT End of Session - 02/28/14 1915    Visit Number 10   Number of Visits 16   Date for PT Re-Evaluation 03/03/14   Authorization Type BCBS   PT Start Time [phone]   PT Stop Time 1020   PT Time Calculation (min) 44 min      Past Medical History  Diagnosis Date  . Seizure   . Hypertension     Past Surgical History  Procedure Laterality Date  . Abdominal hysterectomy    . Brain tumor excision      There were no vitals taken for this visit.  Visit Diagnosis:  Abnormality of gait      Subjective Assessment - 02/28/14 1906    Symptoms Pt. reports doing better with walking and balance   Currently in Pain? No/denies                    [facility] Adult PT Treatment/Exercise - 02/28/14 0940    Transfers   Transfers Sit to Stand  5 times without UE support   Ambulation/Gait   Stairs Yes   Stairs Assistance 5: Supervision   Stair Management Technique Forwards;Two rails   Number of Stairs 4   Height of Stairs 6   Dynamic Standing Balance   Dynamic Standing - Balance Support During functional activity;Bilateral upper extremity supported   High Level Balance   High Level Balance Activities Head turns   High Level Balance Comments tapping down to floor from blue foam with CGA; partial squats standing on blue foam x 10 reps; stepping over and back of balance beam without UE support x 10 reps each leg   Lumbar Exercises: Standing   Heel Raises 10 reps   Functional Squats 10 reps  standing on blue foam   Other Standing Lumbar Exercises bil. hip extension and abduction with green theraband x 10 reps; ankle sways inside bars without UE support x 10 reps   Knee/Hip Exercises:  Aerobic   Stationary Bike Nustep level 3 x 15" (unsupervised) (no charge)   Knee/Hip Exercises: Standing   Forward Step Up 10 reps   Step Down 5 reps   Step Down Limitations weakness in L quad   Ankle Exercises: Standing   Rocker Board 1 minute                  PT Short Term Goals - 01/31/14 2047    PT SHORT TERM GOAL #1   Title Improve Berg score by at least 4 points   Time --  01-31-14   Status Not Met  score incr. by 2 points   PT SHORT TERM GOAL #2   Title Incr. gait velocity to >/= 1.9 ft/sec with cane   Time --  assessed 01-31-14   Period Days   Status Achieved   PT SHORT TERM GOAL #3   Title Amb. in home 26' with cane without LOB   Time --  assessed 01-31-14   Period Days   Status Achieved   PT SHORT TERM GOAL #4   Title Improve TUG </= 15 secs with no device   Time --  01-31-14   Status Achieved  PT Long Term Goals - 01/13/14 1740    PT LONG TERM GOAL #1   Title Verbalize understanding of fall prevention strategies in home environment   Time 3  02-05-14   Period Weeks   Status On-going   PT LONG TERM GOAL #2   Title incr. Berg score by at least 8 points   Baseline 02-05-14   Time 3   Period Weeks   Status On-going   PT LONG TERM GOAL #3   Title incr. gait velocity to >/= 2.1 ft/sec with cane   Baseline 02-05-14   Time 3   Period Weeks   Status On-going   PT LONG TERM GOAL #4   Title Amb. 250' with cane with SBA    Baseline 02-05-14   Time 3   Period Weeks   Status On-going   PT LONG TERM GOAL #5   Title Improve TUG score to </= 13.5 secs without device   Baseline 02-05-14   Time 3   Period Weeks   Status On-going               Plan - 02/28/14 1916    Clinical Impression Statement Pt. is progressing well towards goals - needs cues to lift feet to minimize scuffing and to stand erect   Pt will benefit from skilled therapeutic intervention in order to improve on the following deficits Abnormal gait;Decreased  endurance;Decreased activity tolerance;Decreased balance;Decreased strength   Rehab Potential Good   PT Frequency 2x / week   PT Duration 4 weeks   PT Treatment/Interventions Therapeutic activities;Patient/family education;Therapeutic exercise;Gait training;Balance training;Neuromuscular re-education;Stair training   PT Next Visit Plan check LTG's and D/C   PT Home Exercise Plan Balance HEP as instructed previously   Consulted and Agree with Plan of Care Patient        Problem List There are no active problems to display for this patient.   Nazanin, Kinner, PT 02/28/2014, 7:20 PM  [facility] [address] [place], [place], [address] Phone: [phone]   Fax:  [phone]

## 2014-03-08 ENCOUNTER — Ambulatory Visit: Payer: Medicare Other | Attending: Family Medicine | Admitting: Physical Therapy

## 2014-03-08 DIAGNOSIS — R269 Unspecified abnormalities of gait and mobility: Secondary | ICD-10-CM | POA: Insufficient documentation

## 2014-03-08 DIAGNOSIS — R569 Unspecified convulsions: Secondary | ICD-10-CM | POA: Insufficient documentation

## 2014-03-08 DIAGNOSIS — Z8673 Personal history of transient ischemic attack (TIA), and cerebral infarction without residual deficits: Secondary | ICD-10-CM | POA: Diagnosis not present

## 2014-03-08 DIAGNOSIS — Z5189 Encounter for other specified aftercare: Secondary | ICD-10-CM | POA: Diagnosis not present

## 2014-03-09 ENCOUNTER — Encounter: Payer: Self-pay | Admitting: Physical Therapy

## 2014-03-09 NOTE — Therapy (Signed)
[facility] [address] [place], [place], [address] Phone: [phone]   Fax:  [phone]  Physical Therapy Treatment  Patient Details  Name: Deborah Gallegos MRN: [id number] Date of Birth: 09/26/1950  Encounter Date: 03/08/2014      PT End of Session - 03/09/14 1043    Visit Number 11   Number of Visits 24   Date for PT Re-Evaluation 04/07/14   Authorization Type BCBS   PT Start Time 0936   PT Stop Time 1018   PT Time Calculation (min) 42 min      Past Medical History  Diagnosis Date  . Seizure   . Hypertension     Past Surgical History  Procedure Laterality Date  . Abdominal hysterectomy    . Brain tumor excision      There were no vitals taken for this visit.  Visit Diagnosis:  Abnormality of gait - Plan: PT plan of care cert/re-cert      Subjective Assessment - 03/09/14 1034    Symptoms Pt. reports she is doing better but would like to be able to go up and down steps using normal pattern and not catching left foot   Currently in Pain? No/denies                    [facility] Adult PT Treatment/Exercise - 03/09/14 1040    Ambulation/Gait   Assistive device Straight cane   Gait velocity 2.42  initially 1.88, then retested with faster gait speed   Stairs Yes   Stairs Assistance 5: Supervision   Stair Management Technique Two rails;Step to pattern   Number of Stairs 4   Height of Stairs 6   Berg Balance Test   Sit to Stand Able to stand without using hands and stabilize independently   Standing Unsupported Able to stand safely 2 minutes   Sitting with Back Unsupported but Feet Supported on Floor or Stool Able to sit safely and securely 2 minutes   Stand to Sit Sits safely with minimal use of hands   Transfers Able to transfer safely, minor use of hands   Standing Unsupported with Eyes Closed Able to stand 10 seconds safely   Standing Ubsupported with Feet Together Able to place feet  together independently and stand 1 minute safely   From Standing, Reach Forward with Outstretched Arm Can reach forward >12 cm safely (5")   From Standing Position, Pick up Object from Floor Able to pick up shoe safely and easily   From Standing Position, Turn to Look Behind Over each Shoulder Looks behind from both sides and weight shifts well   Turn 360 Degrees Able to turn 360 degrees safely but slowly  5.03, 5.5 L and R   Standing Unsupported, Alternately Place Feet on Step/Stool Able to stand independently and complete 8 steps >20 seconds   Standing Unsupported, One Foot in Front Able to plae foot ahead of the other independently and hold 30 seconds   Standing on One Leg Tries to lift leg/unable to hold 3 seconds but remains standing independently   Total Score 48   Timed Up and Go Test   TUG Normal TUG   Normal TUG (seconds) 12.21                  PT Short Term Goals - 03/08/14 1002    PT SHORT TERM GOAL #2   Title Incr. gait velocity to >/= 1.9 ft/sec with cane             PT Long Term Goals - 03/09/14 1047    PT LONG TERM GOAL #1   Title Verbalize understanding of fall prevention strategies in home environment   Baseline met 03-08-14   Time 3   Period Weeks   Status Achieved   PT LONG TERM GOAL #2   Title incr. Berg score by at least 8 points   Baseline ;initial score 43/56   Time 3   Period Weeks   Status On-going   PT LONG TERM GOAL #3   Title incr. gait velocity to >/= 2.1 ft/sec with cane   Baseline 2.49 ft/sec    Time 3   Period Weeks   Status Achieved   PT LONG TERM GOAL #4   Title Amb. 250' with cane with SBA    Baseline met   Time 1   Period Weeks   Status Achieved   PT LONG TERM GOAL #5   Title Improve TUG score to </= 13.5 secs without device   Baseline met with score 12.21 secs on 03-08-14   Time 3   Period Weeks   Status Achieved   Additional Long Term Goals   Additional Long Term Goals Yes   PT LONG TERM GOAL #6   Title Improve Berg  balance test score to >/= 51/56   Baseline 04-08-24            score 48/56 on 03-08-14   Time 4   Period Weeks   Status New   PT LONG TERM GOAL #7   Title Pt. will ambulate for 3" nonstop with cane to demo improved endurance/activity tolerance   Baseline 2 1/2" with cane on 03-08-14:             target date     04-08-14   Time 4   Period Weeks   Status New   PT LONG TERM GOAL #8   Title Negotiate 4 steps with cane with 1 rail using a step over step sequence for incr. efficiency.   Baseline 04-08-14 target date   Time 4   Period Weeks   Status New   PT LONG TERM GOAL  #9   TITLE Incr. gait velocity to >/= 3.0 ft/sec with cane   Baseline 2.42 without cane    target date 04-08-14   Time 4   Period Weeks   Status New               Plan - 03/09/14 1045    Clinical Impression Statement Pt. is progressing well - plan to renew due to improvements in balance and gait and pt. demo potential to further progress   Pt will benefit from skilled therapeutic intervention in order to improve on the following deficits Abnormal gait;Decreased endurance;Decreased activity tolerance;Decreased balance;Decreased strength   Rehab Potential Good   PT Frequency 2x / week   PT Duration 4 weeks   PT Treatment/Interventions Therapeutic activities;Patient/family education;Therapeutic exercise;Gait training;Balance training;Neuromuscular re-education;Stair training   PT Next Visit Plan conitnue with balance and gait; step training; single limb stance   PT Home Exercise Plan Balance HEP as instructed previously   Consulted and Agree with Plan of Care Patient        Problem List There are no active problems to display for this patient.   Dilday, Edessa Suzanne, PT 03/09/2014, 11:04 AM  [facility] [address] [place], [place], [address] Phone: [phone]   Fax:  [phone]      

## 2014-03-10 ENCOUNTER — Ambulatory Visit: Payer: Medicare Other | Admitting: Physical Therapy

## 2014-03-10 DIAGNOSIS — R269 Unspecified abnormalities of gait and mobility: Secondary | ICD-10-CM

## 2014-03-10 DIAGNOSIS — Z5189 Encounter for other specified aftercare: Secondary | ICD-10-CM | POA: Diagnosis not present

## 2014-03-11 ENCOUNTER — Encounter: Payer: Self-pay | Admitting: Physical Therapy

## 2014-03-11 NOTE — Therapy (Signed)
[facility] [address] [place], [place], [address] Phone: [phone]   Fax:  [phone]  Physical Therapy Treatment  Patient Details  Name: Deborah Gallegos MRN: [id number] Date of Birth: 1950/04/19 Referring Provider:  Lucianne Lei, MD  Encounter Date: 03/10/2014      PT End of Session - 03/11/14 1001    Visit Number 12   Number of Visits 24   Date for PT Re-Evaluation 04/07/14   Authorization Type BCBS   PT Start Time 1404   PT Stop Time 1450   PT Time Calculation (min) 46 min      Past Medical History  Diagnosis Date  . Seizure   . Hypertension     Past Surgical History  Procedure Laterality Date  . Abdominal hysterectomy    . Brain tumor excision      There were no vitals taken for this visit.  Visit Diagnosis:  Abnormality of gait      Subjective Assessment - 03/11/14 0957    Symptoms Pt. reports being tired today - had dentist appt. this am - doing fine with balance and ambulation   Currently in Pain? No/denies                    [facility] Adult PT Treatment/Exercise - 03/11/14 0958    Transfers   Sit to Stand 5: Supervision  5 reps on blue foam   Ambulation/Gait   Ambulation/Gait Yes   Ambulation/Gait Assistance 5: Supervision   Ambulation/Gait Assistance Details cues for posture   Ambulation Distance (Feet) 250 Feet   Assistive device None   Stairs Yes   Stairs Assistance 5: Supervision   Stair Management Technique One rail Left  to simulate ste up at home   Number of Stairs 4  reps   Knee/Hip Exercises: Aerobic   Stationary Bike Nustep level 3 x 5"   Knee/Hip Exercises: Standing   Heel Raises 10 reps   Forward Step Up 10 reps;Step Height: 6";Left   Step Down Left;Hand Hold: 2;Step Height: 6";10 reps     TherEx; 3# weight used for bil. Hip extension, flexion, abduction in standing x 10 reps each Alternate tap ups to 6" step with minimal UE support with CGA x 10  reps each             PT Short Term Goals - 03/08/14 1002    PT SHORT TERM GOAL #2   Title Incr. gait velocity to >/= 1.9 ft/sec with cane           PT Long Term Goals - 03/09/14 1047    PT LONG TERM GOAL #1   Title Verbalize understanding of fall prevention strategies in home environment   Baseline met 03-08-14   Time 3   Period Weeks   Status Achieved   PT LONG TERM GOAL #2   Title incr. Berg score by at least 8 points   Baseline ;initial score 43/56   Time 3   Period Weeks   Status On-going   PT LONG TERM GOAL #3   Title incr. gait velocity to >/= 2.1 ft/sec with cane   Baseline 2.49 ft/sec    Time 3   Period Weeks   Status Achieved   PT LONG TERM GOAL #4   Title Amb. 250' with cane with SBA    Baseline met   Time 1   Period Weeks   Status Achieved   PT LONG TERM GOAL #5  Title Improve TUG score to </= 13.5 secs without device   Baseline met with score 12.21 secs on 03-08-14   Time 3   Period Weeks   Status Achieved   Additional Long Term Goals   Additional Long Term Goals Yes   PT LONG TERM GOAL #6   Title Improve Berg balance test score to >/= 51/56   Baseline 04-08-24            score 48/56 on 03-08-14   Time 4   Period Weeks   Status New   PT LONG TERM GOAL #7   Title Pt. will ambulate for 3" nonstop with cane to demo improved endurance/activity tolerance   Baseline 2 1/2" with cane on 03-08-14:             target date     04-08-14   Time 4   Period Weeks   Status New   PT LONG TERM GOAL #8   Title Negotiate 4 steps with cane with 1 rail using a step over step sequence for incr. efficiency.   Baseline 04-08-14 target date   Time 4   Period Weeks   Status New   PT LONG TERM GOAL  #9   TITLE Incr. gait velocity to >/= 3.0 ft/sec with cane   Baseline 2.42 without cane    target date 04-08-14   Time 4   Period Weeks   Status New               Problem List There are no active problems to display for this patient.   Alizah, Sills,  PT 03/11/2014, 10:03 AM  [facility] [address] [place], [place], [address] Phone: [phone]   Fax:  [phone]

## 2014-03-14 ENCOUNTER — Encounter: Payer: Self-pay | Admitting: Physical Therapy

## 2014-03-14 ENCOUNTER — Ambulatory Visit: Payer: Medicare Other | Admitting: Physical Therapy

## 2014-03-14 DIAGNOSIS — R269 Unspecified abnormalities of gait and mobility: Secondary | ICD-10-CM

## 2014-03-14 DIAGNOSIS — Z5189 Encounter for other specified aftercare: Secondary | ICD-10-CM | POA: Diagnosis not present

## 2014-03-14 NOTE — Therapy (Signed)
[facility] [address] [place], [place], [address] Phone: [phone]   Fax:  [phone]  Physical Therapy Treatment  Patient Details  Name: Deborah Gallegos MRN: [id number] Date of Birth: 05-May-1950 Referring Provider:  Lucianne Lei, MD  Encounter Date: 03/14/2014      PT End of Session - 03/14/14 1708    Visit Number 13   Number of Visits 24   Date for PT Re-Evaluation 04/07/14   Authorization Type BCBS   PT Start Time [phone]   PT Stop Time 6812   PT Time Calculation (min) 42 min      Past Medical History  Diagnosis Date  . Seizure   . Hypertension     Past Surgical History  Procedure Laterality Date  . Abdominal hysterectomy    . Brain tumor excision      There were no vitals taken for this visit.  Visit Diagnosis:  Abnormality of gait      Subjective Assessment - 03/14/14 1659    Symptoms Pt. states she would like to be able to push her trash cans to the street by herself safely   Currently in Pain? No/denies                    [facility] Adult PT Treatment/Exercise - 03/14/14 1700    Ambulation/Gait   Ambulation/Gait Yes   Ambulation/Gait Assistance 5: Supervision   Ambulation/Gait Assistance Details cues for upright posture and initial heel contact   Ambulation Distance (Feet) 250 Feet   Assistive device None   High Level Balance   High Level Balance Activities Head turns  marching in place on incline and on decline with min assist   Lumbar Exercises: Standing   Heel Raises 10 reps   Other Standing Lumbar Exercises bil. hip extension and abduction with green theraband x 10 reps; ankle sways inside bars without UE support x 10 reps   Knee/Hip Exercises: Aerobic   Stationary Bike Nustep level 3 x 5"   Knee/Hip Exercises: Standing   Heel Raises 10 reps   Forward Step Up 10 reps;Step Height: 6";Left  and RLE   Rocker Board 2 minutes  side to side and anterior/posteriorly   Ankle Exercises: Standing   Toe Raise 10 reps      Neuro Re-ed:  Resisted ambulation with sports tubing - pt. Initially stood with perturbations at counter with UE support As needed; progressed to alternate stepping up/back in place with UE support prn and then ambulating along counter with  UE support 25' x 4 reps to promote core stabilization and recovery of LOB Pt. Performed stepping over and back of blue foam with UE support prn 10 reps each leg; alternate tap ups to 6" step with  CGA to min assist; to 2nd step with minimal UE support  There Ex; squats x 10 reps standing on blue foam with UE support prn with CGA           PT Short Term Goals - 03/08/14 1002    PT SHORT TERM GOAL #2   Title Incr. gait velocity to >/= 1.9 ft/sec with cane           PT Long Term Goals - 03/09/14 1047    PT LONG TERM GOAL #1   Title Verbalize understanding of fall prevention strategies in home environment   Baseline met 03-08-14   Time 3   Period Weeks   Status Achieved   PT LONG TERM GOAL #  2   Title incr. Berg score by at least 8 points   Baseline ;initial score 43/56   Time 3   Period Weeks   Status On-going   PT LONG TERM GOAL #3   Title incr. gait velocity to >/= 2.1 ft/sec with cane   Baseline 2.49 ft/sec    Time 3   Period Weeks   Status Achieved   PT LONG TERM GOAL #4   Title Amb. 250' with cane with SBA    Baseline met   Time 1   Period Weeks   Status Achieved   PT LONG TERM GOAL #5   Title Improve TUG score to </= 13.5 secs without device   Baseline met with score 12.21 secs on 03-08-14   Time 3   Period Weeks   Status Achieved   Additional Long Term Goals   Additional Long Term Goals Yes   PT LONG TERM GOAL #6   Title Improve Berg balance test score to >/= 51/56   Baseline 04-08-24            score 48/56 on 03-08-14   Time 4   Period Weeks   Status New   PT LONG TERM GOAL #7   Title Pt. will ambulate for 3" nonstop with cane to demo improved endurance/activity  tolerance   Baseline 2 1/2" with cane on 03-08-14:             target date     04-08-14   Time 4   Period Weeks   Status New   PT LONG TERM GOAL #8   Title Negotiate 4 steps with cane with 1 rail using a step over step sequence for incr. efficiency.   Baseline 04-08-14 target date   Time 4   Period Weeks   Status New   PT LONG TERM GOAL  #9   TITLE Incr. gait velocity to >/= 3.0 ft/sec with cane   Baseline 2.42 without cane    target date 04-08-14   Time 4   Period Weeks   Status New               Plan - 03/14/14 1710    Pt will benefit from skilled therapeutic intervention in order to improve on the following deficits Abnormal gait;Decreased endurance;Decreased activity tolerance;Decreased balance;Decreased strength   Rehab Potential Good   PT Frequency 2x / week   PT Duration 4 weeks   PT Treatment/Interventions Therapeutic activities;Patient/family education;Therapeutic exercise;Gait training;Balance training;Neuromuscular re-education;Stair training   PT Next Visit Plan conitnue with balance and gait; step training; single limb stance   PT Home Exercise Plan Balance HEP as instructed previously   Consulted and Agree with Plan of Care Patient        Problem List There are no active problems to display for this patient.   Kaisley, St[place]on, PT 03/14/2014, 5:12 PM  [facility] [address] [place], [place], [address] Phone: [phone]   Fax:  [phone]

## 2014-03-17 ENCOUNTER — Ambulatory Visit: Payer: Medicare Other | Admitting: Physical Therapy

## 2014-03-17 DIAGNOSIS — Z5189 Encounter for other specified aftercare: Secondary | ICD-10-CM | POA: Diagnosis not present

## 2014-03-17 DIAGNOSIS — R269 Unspecified abnormalities of gait and mobility: Secondary | ICD-10-CM

## 2014-03-18 ENCOUNTER — Encounter: Payer: Self-pay | Admitting: Physical Therapy

## 2014-03-18 NOTE — Therapy (Signed)
[facility] [address] [place], [place], [address] Phone: [phone]   Fax:  [phone]  Physical Therapy Treatment  Patient Details  Name: Deborah Gallegos MRN: [id number] Date of Birth: 1950/06/10 Referring Provider:  Lucianne Lei, MD  Encounter Date: 03/17/2014      PT End of Session - 03/18/14 1125    Visit Number 14   Number of Visits 24   Date for PT Re-Evaluation 04/07/14   Authorization Type BC[employer]   PT Start Time 1450   PT Stop Time 1530   PT Time Calculation (min) 40 min   Equipment Utilized During Treatment Gait belt      Past Medical History  Diagnosis Date  . Seizure   . Hypertension     Past Surgical History  Procedure Laterality Date  . Abdominal hysterectomy    . Brain tumor excision      There were no vitals taken for this visit.  Visit Diagnosis:  Abnormality of gait      Subjective Assessment - 03/18/14 1124    Symptoms Pt. reports no falls - no new problems   Currently in Pain? No/denies                    [facility] Adult PT Treatment/Exercise - 03/18/14 0001    Ambulation/Gait   Ambulation/Gait Yes   Ambulation/Gait Assistance 5: Supervision   Ambulation Distance (Feet) 120 Feet   Assistive device None   High Level Balance   High Level Balance Activities Head turns  marching in place on incline and on decline with min assist   Lumbar Exercises: Standing   Heel Raises 10 reps   Other Standing Lumbar Exercises bil. hip extension and abduction with green theraband x 10 reps; ankle sways inside bars without UE support x 10 reps   Knee/Hip Exercises: Aerobic   Stationary Bike Nustep level 3 x 5"   Knee/Hip Exercises: Standing   Heel Raises 10 reps   Forward Step Up 10 reps;Step Height: 6";Left  and RLE   Rocker Board 2 minutes  side to side and anterior/posteriorly   Ankle Exercises: Standing   Toe Raise 10 reps                  PT Short Term  Goals - 03/08/14 1002    PT SHORT TERM GOAL #2   Title Incr. gait velocity to >/= 1.9 ft/sec with cane           PT Long Term Goals - 03/09/14 1047    PT LONG TERM GOAL #1   Title Verbalize understanding of fall prevention strategies in home environment   Baseline met 03-08-14   Time 3   Period Weeks   Status Achieved   PT LONG TERM GOAL #2   Title incr. Berg score by at least 8 points   Baseline ;initial score 43/56   Time 3   Period Weeks   Status On-going   PT LONG TERM GOAL #3   Title incr. gait velocity to >/= 2.1 ft/sec with cane   Baseline 2.49 ft/sec    Time 3   Period Weeks   Status Achieved   PT LONG TERM GOAL #4   Title Amb. 250' with cane with SBA    Baseline met   Time 1   Period Weeks   Status Achieved   PT LONG TERM GOAL #5   Title Improve TUG score to </= 13.5 secs without device  Baseline met with score 12.21 secs on 03-08-14   Time 3   Period Weeks   Status Achieved   Additional Long Term Goals   Additional Long Term Goals Yes   PT LONG TERM GOAL #6   Title Improve Berg balance test score to >/= 51/56   Baseline 04-08-24            score 48/56 on 03-08-14   Time 4   Period Weeks   Status New   PT LONG TERM GOAL #7   Title Pt. will ambulate for 3" nonstop with cane to demo improved endurance/activity tolerance   Baseline 2 1/2" with cane on 03-08-14:             target date     04-08-14   Time 4   Period Weeks   Status New   PT LONG TERM GOAL #8   Title Negotiate 4 steps with cane with 1 rail using a step over step sequence for incr. efficiency.   Baseline 04-08-14 target date   Time 4   Period Weeks   Status New   PT LONG TERM GOAL  #9   TITLE Incr. gait velocity to >/= 3.0 ft/sec with cane   Baseline 2.42 without cane    target date 04-08-14   Time 4   Period Weeks   Status New               Plan - 03/18/14 1126    Clinical Impression Statement pt. progressing well - has incr. trunk lean with fatigue   Pt will benefit from skilled  therapeutic intervention in order to improve on the following deficits Abnormal gait;Decreased endurance;Decreased activity tolerance;Decreased balance;Decreased strength   Rehab Potential Good   PT Frequency 2x / week   PT Duration 4 weeks   PT Treatment/Interventions Therapeutic activities;Patient/family education;Therapeutic exercise;Gait training;Balance training;Neuromuscular re-education;Stair training   PT Next Visit Plan conitnue with balance and gait; step training; single limb stance   PT Home Exercise Plan Balance HEP as instructed previously        Problem List There are no active problems to display for this patient.   Taegan, Haider, PT 03/18/2014, 11:28 AM  [facility] [address] [place], [place], [address] Phone: [phone]   Fax:  [phone]

## 2014-03-22 ENCOUNTER — Ambulatory Visit: Payer: Medicare Other | Admitting: Physical Therapy

## 2014-03-22 DIAGNOSIS — R269 Unspecified abnormalities of gait and mobility: Secondary | ICD-10-CM

## 2014-03-22 DIAGNOSIS — Z5189 Encounter for other specified aftercare: Secondary | ICD-10-CM | POA: Diagnosis not present

## 2014-03-23 ENCOUNTER — Encounter: Payer: Self-pay | Admitting: Physical Therapy

## 2014-03-23 NOTE — Therapy (Signed)
[facility] [address] [place], [place], [address] Phone: [phone]   Fax:  [phone]  Physical Therapy Treatment  Patient Details  Name: Deborah Gallegos MRN: [id number] Date of Birth: September 11, 1950 Referring Provider:  Lucianne Lei, MD  Encounter Date: 03/22/2014      PT End of Session - 03/23/14 1003    Visit Number 15  G5   Number of Visits 24   Date for PT Re-Evaluation 04/07/14   Authorization Type UHC Medicare    PT Start Time 1455   PT Stop Time 1535   PT Time Calculation (min) 40 min      Past Medical History  Diagnosis Date  . Seizure   . Hypertension     Past Surgical History  Procedure Laterality Date  . Abdominal hysterectomy    . Brain tumor excision      There were no vitals taken for this visit.  Visit Diagnosis:  No diagnosis found.      Subjective Assessment - 03/23/14 1001    Symptoms "Doing fine, just a little tired today"   Currently in Pain? No/denies                    [facility] Adult PT Treatment/Exercise - 03/23/14 0001    Ambulation/Gait   Ambulation/Gait Yes   Ambulation/Gait Assistance 5: Supervision   Ambulation Distance (Feet) 240 Feet   Assistive device None   Lumbar Exercises: Standing   Heel Raises 10 reps   Other Standing Lumbar Exercises bil. hip extension and abduction with green theraband x 10 reps; ankle sways inside bars without UE support x 10 reps   Knee/Hip Exercises: Standing   Heel Raises 10 reps   Rocker Board 2 minutes  side to side and anterior/posteriorly                  PT Short Term Goals - 03/08/14 1002    PT SHORT TERM GOAL #2   Title Incr. gait velocity to >/= 1.9 ft/sec with cane           PT Long Term Goals - 03/09/14 1047    PT LONG TERM GOAL #1   Title Verbalize understanding of fall prevention strategies in home environment   Baseline met 03-08-14   Time 3   Period Weeks   Status Achieved   PT LONG  TERM GOAL #2   Title incr. Berg score by at least 8 points   Baseline ;initial score 43/56   Time 3   Period Weeks   Status On-going   PT LONG TERM GOAL #3   Title incr. gait velocity to >/= 2.1 ft/sec with cane   Baseline 2.49 ft/sec    Time 3   Period Weeks   Status Achieved   PT LONG TERM GOAL #4   Title Amb. 250' with cane with SBA    Baseline met   Time 1   Period Weeks   Status Achieved   PT LONG TERM GOAL #5   Title Improve TUG score to </= 13.5 secs without device   Baseline met with score 12.21 secs on 03-08-14   Time 3   Period Weeks   Status Achieved   Additional Long Term Goals   Additional Long Term Goals Yes   PT LONG TERM GOAL #6   Title Improve Berg balance test score to >/= 51/56   Baseline 04-08-24  score 48/56 on 03-08-14   Time 4   Period Weeks   Status New   PT LONG TERM GOAL #7   Title Pt. will ambulate for 3" nonstop with cane to demo improved endurance/activity tolerance   Baseline 2 1/2" with cane on 03-08-14:             target date     04-08-14   Time 4   Period Weeks   Status New   PT LONG TERM GOAL #8   Title Negotiate 4 steps with cane with 1 rail using a step over step sequence for incr. efficiency.   Baseline 04-08-14 target date   Time 4   Period Weeks   Status New   PT LONG TERM GOAL  #9   TITLE Incr. gait velocity to >/= 3.0 ft/sec with cane   Baseline 2.42 without cane    target date 04-08-14   Time 4   Period Weeks   Status New               Plan - 03/23/14 1005    Clinical Impression Statement Pt. progressing well - noted to have incr. trunk flexion with decr. initial heel contact today, possibly due to fatigue; able to correct deviations with cues   Pt will benefit from skilled therapeutic intervention in order to improve on the following deficits Abnormal gait;Decreased endurance;Decreased activity tolerance;Decreased balance;Decreased strength   Rehab Potential Good   PT Frequency 2x / week   PT Duration 4 weeks    PT Treatment/Interventions Therapeutic activities;Patient/family education;Therapeutic exercise;Gait training;Balance training;Neuromuscular re-education;Stair training   PT Next Visit Plan conitnue with balance and gait; step training; single limb stance   PT Home Exercise Plan Balance HEP as instructed previously   Consulted and Agree with Plan of Care Patient        Problem List There are no active problems to display for this patient.   ZOXWRU, EAVWU [id number], PT 03/23/2014, 10:08 AM  [facility] [address] [place], [place], [address] Phone: [phone]   Fax:  [phone]

## 2014-03-24 ENCOUNTER — Ambulatory Visit: Payer: Medicare Other | Admitting: Physical Therapy

## 2014-03-24 ENCOUNTER — Encounter: Payer: Self-pay | Admitting: Physical Therapy

## 2014-03-24 DIAGNOSIS — R269 Unspecified abnormalities of gait and mobility: Secondary | ICD-10-CM

## 2014-03-24 DIAGNOSIS — Z5189 Encounter for other specified aftercare: Secondary | ICD-10-CM | POA: Diagnosis not present

## 2014-03-24 NOTE — Therapy (Signed)
[facility] [address] [place], [place], [address] Phone: [phone]   Fax:  [phone]  Physical Therapy Treatment  Patient Details  Name: Deborah Gallegos MRN: [id number] Date of Birth: 02/02/1951 Referring Provider:  Lucianne Lei, MD  Encounter Date: 03/24/2014      PT End of Session - 03/24/14 1916    Visit Number 16  G6   Number of Visits 24   Date for PT Re-Evaluation 04/07/14   Authorization Type UHC Medicare    PT Start Time 6283   PT Stop Time 1451   PT Time Calculation (min) 46 min      Past Medical History  Diagnosis Date  . Seizure   . Hypertension     Past Surgical History  Procedure Laterality Date  . Abdominal hysterectomy    . Brain tumor excision      There were no vitals taken for this visit.  Visit Diagnosis:  Abnormality of gait      Subjective Assessment - 03/24/14 1914    Symptoms Pt. reports no new problems - no falls reported; "Doing fine"   Currently in Pain? No/denies      TherEx:  Sit to stand x 5 reps on foam with CGA;  Heel raises x 10 reps;  Left step ups x 10 reps; Bil. Hip flexion, extension, and abduction x 10 reps with green theraband bil. LE's  Nustep Level 4 x 5" with UE's and LE's  Gait; No device 120' with cues for posture and step length; 30' x 2 reps holding ball overhead for incr. Erect posture Incr. Trunk extension Step training with rails 4 steps x 1 rep with SBA  NeuroRe-ed:  Single limb stance activities - alternate tap ups to 1st and 2nd step with UE assist prn with CGA; Marching on foam with CGA x 10 reps each.; sit to stand with pivot turn 180 degrees 5 x to right and left sides with CGA  To min assist Rocker board with bil. UE support x 30 reps; stepping over and back of 1/2 bolster with minimal UE support 10 reps with  RLE and LLE                       PT Short Term Goals - 03/08/14 1002    PT SHORT TERM GOAL #2   Title Incr. gait velocity to >/= 1.9 ft/sec with cane           PT Long Term Goals - 03/09/14 1047    PT LONG TERM GOAL #1   Title Verbalize understanding of fall prevention strategies in home environment   Baseline met 03-08-14   Time 3   Period Weeks   Status Achieved   PT LONG TERM GOAL #2   Title incr. Berg score by at least 8 points   Baseline ;initial score 43/56   Time 3   Period Weeks   Status On-going   PT LONG TERM GOAL #3   Title incr. gait velocity to >/= 2.1 ft/sec with cane   Baseline 2.49 ft/sec    Time 3   Period Weeks   Status Achieved   PT LONG TERM GOAL #4   Title Amb. 250' with cane with SBA    Baseline met   Time 1   Period Weeks   Status Achieved   PT LONG TERM GOAL #5   Title Improve TUG score to </= 13.5 secs without device  Baseline met with score 12.21 secs on 03-08-14   Time 3   Period Weeks   Status Achieved   Additional Long Term Goals   Additional Long Term Goals Yes   PT LONG TERM GOAL #6   Title Improve Berg balance test score to >/= 51/56   Baseline 04-08-24            score 48/56 on 03-08-14   Time 4   Period Weeks   Status New   PT LONG TERM GOAL #7   Title Pt. will ambulate for 3" nonstop with cane to demo improved endurance/activity tolerance   Baseline 2 1/2" with cane on 03-08-14:             target date     04-08-14   Time 4   Period Weeks   Status New   PT LONG TERM GOAL #8   Title Negotiate 4 steps with cane with 1 rail using a step over step sequence for incr. efficiency.   Baseline 04-08-14 target date   Time 4   Period Weeks   Status New   PT LONG TERM GOAL  #9   TITLE Incr. gait velocity to >/= 3.0 ft/sec with cane   Baseline 2.42 without cane    target date 04-08-14   Time 4   Period Weeks   Status New               Plan - 03/24/14 1917    Clinical Impression Statement Pt's gait pattern improved when pt. not as fatigued: pt. able to stand more erect and achieve greater initial heel contact when not tired; incr.  trunk flexion noted with fatigue   Rehab Potential Good   PT Frequency 2x / week   PT Duration 4 weeks   PT Treatment/Interventions Therapeutic activities;Patient/family education;Therapeutic exercise;Gait training;Balance training;Neuromuscular re-education;Stair training   PT Next Visit Plan conitnue with balance and gait; step training; single limb stance   PT Home Exercise Plan Balance HEP as instructed previously   Consulted and Agree with Plan of Care Patient        Problem List There are no active problems to display for this patient.   Makinlee, Awwad, PT 03/24/2014, 7:21 PM  [facility] [address] [place], [place], [address] Phone: [phone]   Fax:  [phone]

## 2014-03-29 ENCOUNTER — Ambulatory Visit: Payer: Medicare Other | Admitting: Physical Therapy

## 2014-03-29 ENCOUNTER — Encounter: Payer: Self-pay | Admitting: Physical Therapy

## 2014-03-29 DIAGNOSIS — R269 Unspecified abnormalities of gait and mobility: Secondary | ICD-10-CM

## 2014-03-29 DIAGNOSIS — Z5189 Encounter for other specified aftercare: Secondary | ICD-10-CM | POA: Diagnosis not present

## 2014-03-29 NOTE — Therapy (Signed)
[facility] [address] [place], [place], [address] Phone: [phone]   Fax:  [phone]  Physical Therapy Treatment  Patient Details  Name: Deborah Gallegos MRN: [id number] Date of Birth: [birth year] Referring Provider:  Lucianne Lei, MD  Encounter Date: 03/29/2014      PT End of Session - 03/29/14 1114    Visit Number 73  G7   Number of Visits 24   Date for PT Re-Evaluation 04/07/14   Authorization Type UHC Medicare   PT Start Time 0254   PT Stop Time 1105   PT Time Calculation (min) 42 min   Equipment Utilized During Treatment Gait belt      Past Medical History  Diagnosis Date  . Seizure   . Hypertension     Past Surgical History  Procedure Laterality Date  . Abdominal hysterectomy    . Brain tumor excision      There were no vitals taken for this visit.  Visit Diagnosis:  Abnormality of gait      Subjective Assessment - 03/29/14 1105    Symptoms Pt. reports no new problems - "doing fine today"   Currently in Pain? No/denies                    [facility] Adult PT Treatment/Exercise - 03/29/14 1106    Ambulation/Gait   Ambulation/Gait Yes   Ambulation/Gait Assistance 5: Supervision   Ambulation Distance (Feet) 240 Feet   Assistive device None   Ambulation/Gait Yes   Ambulation/Gait Assistance 5: Supervision   Ambulation Distance (Feet) 240 Feet   Assistive device None   Dynamic Standing Balance   Dynamic Standing - Balance Support During functional activity   High Level Balance   High Level Balance Activities Side stepping  alternate stepping on incline/decline with CGA   Lumbar Exercises: Standing   Heel Raises 10 reps   Other Standing Lumbar Exercises bil. hip extension and abduction with green theraband x 10 reps; ankle sways inside bars without UE support x 10 reps   Knee/Hip Exercises: Aerobic   Stationary Bike Nustep level 5 x 10"   Knee/Hip Exercises: Standing   Heel  Raises 10 reps   Forward Step Up 10 reps;Step Height: 6";Left  and RLE   Rocker Board 2 minutes  side to side and anterior/posteriorly    Neuro re-ed; cone taps on floor; marching and alternate stepping forward, back and laterally and back/up with CGA    Step ups 10 reps each leg to 6" step forward; alternate tap ups without UE support with CGA          PT Short Term Goals - 03/08/14 1002    PT SHORT TERM GOAL #2   Title Incr. gait velocity to >/= 1.9 ft/sec with cane           PT Long Term Goals - 03/09/14 1047    PT LONG TERM GOAL #1   Title Verbalize understanding of fall prevention strategies in home environment   Baseline met 03-08-14   Time 3   Period Weeks   Status Achieved   PT LONG TERM GOAL #2   Title incr. Berg score by at least 8 points   Baseline ;initial score 43/56   Time 3   Period Weeks   Status On-going   PT LONG TERM GOAL #3   Title incr. gait velocity to >/= 2.1 ft/sec with cane   Baseline 2.49 ft/sec    Time 3  Period Weeks   Status Achieved   PT LONG TERM GOAL #4   Title Amb. 250' with cane with SBA    Baseline met   Time 1   Period Weeks   Status Achieved   PT LONG TERM GOAL #5   Title Improve TUG score to </= 13.5 secs without device   Baseline met with score 12.21 secs on 03-08-14   Time 3   Period Weeks   Status Achieved   Additional Long Term Goals   Additional Long Term Goals Yes   PT LONG TERM GOAL #6   Title Improve Berg balance test score to >/= 51/56   Baseline 04-08-24            score 48/56 on 03-08-14   Time 4   Period Weeks   Status New   PT LONG TERM GOAL #7   Title Pt. will ambulate for 3" nonstop with cane to demo improved endurance/activity tolerance   Baseline 2 1/2" with cane on 03-08-14:             target date     04-08-14   Time 4   Period Weeks   Status New   PT LONG TERM GOAL #8   Title Negotiate 4 steps with cane with 1 rail using a step over step sequence for incr. efficiency.   Baseline 04-08-14 target date    Time 4   Period Weeks   Status New   PT LONG TERM GOAL  #9   TITLE Incr. gait velocity to >/= 3.0 ft/sec with cane   Baseline 2.42 without cane    target date 04-08-14   Time 4   Period Weeks   Status New               Plan - 03/29/14 1123    Clinical Impression Statement Pt. not as fatigued today - balance is improved with pt. standing more erect; pt. able to ride Nustep 10" at level 5 for 1st time today   Pt will benefit from skilled therapeutic intervention in order to improve on the following deficits Abnormal gait;Decreased endurance;Decreased activity tolerance;Decreased balance;Decreased strength   Rehab Potential Good   PT Frequency 2x / week   PT Duration 4 weeks   PT Treatment/Interventions Therapeutic activities;Patient/family education;Therapeutic exercise;Gait training;Balance training;Neuromuscular re-education;Stair training   PT Next Visit Plan conitnue with balance and gait; step training; single limb stance   PT Home Exercise Plan Balance HEP as instructed previously        Problem List There are no active problems to display for this patient.   Yulieth, Carrender, PT 03/29/2014, 11:29 AM  [facility] [address] [place], [place], [address] Phone: [phone]   Fax:  [phone]

## 2014-03-31 ENCOUNTER — Ambulatory Visit: Payer: Medicare Other | Admitting: Physical Therapy

## 2014-04-05 ENCOUNTER — Ambulatory Visit: Payer: Medicare Other | Admitting: Physical Therapy

## 2014-04-07 ENCOUNTER — Ambulatory Visit: Payer: Medicare Other | Attending: Family Medicine | Admitting: Physical Therapy

## 2014-04-07 DIAGNOSIS — R569 Unspecified convulsions: Secondary | ICD-10-CM | POA: Insufficient documentation

## 2014-04-07 DIAGNOSIS — Z8673 Personal history of transient ischemic attack (TIA), and cerebral infarction without residual deficits: Secondary | ICD-10-CM | POA: Insufficient documentation

## 2014-04-07 DIAGNOSIS — Z5189 Encounter for other specified aftercare: Secondary | ICD-10-CM | POA: Insufficient documentation

## 2014-04-07 DIAGNOSIS — R269 Unspecified abnormalities of gait and mobility: Secondary | ICD-10-CM | POA: Insufficient documentation

## 2014-04-08 ENCOUNTER — Encounter: Payer: Self-pay | Admitting: Physical Therapy

## 2014-04-08 NOTE — Therapy (Signed)
[facility] [address] [place], [place], [address] Phone: [phone]   Fax:  [phone]  Physical Therapy Treatment  Patient Details  Name: Deborah Gallegos MRN: [id number] Date of Birth: 02/26/1951 Referring Provider:  Bland, Veita, MD  Encounter Date: 04/07/2014      PT End of Session - 04/08/14 1132    Visit Number 18   Number of Visits 24   Date for PT Re-Evaluation 04/07/14   Authorization Type UHC Medicare   PT Start Time 1025   PT Stop Time 1105   PT Time Calculation (min) 40 min      Past Medical History  Diagnosis Date  . Seizure   . Hypertension     Past Surgical History  Procedure Laterality Date  . Abdominal hysterectomy    . Brain tumor excision      There were no vitals taken for this visit.  Visit Diagnosis:  Abnormality of gait      Subjective Assessment - 04/08/14 1129    Symptoms Pt. states she had a slight fall last week - didn't turn on light and tripped on table but was able to get up by herself   Currently in Pain? No/denies                    [facility] Adult PT Treatment/Exercise - 04/08/14 0001    Ambulation/Gait   Ambulation/Gait Yes   Ambulation/Gait Assistance 5: Supervision   Ambulation Distance (Feet) 240 Feet   Assistive device None   Dynamic Standing Balance   Dynamic Standing - Balance Support During functional activity   High Level Balance   High Level Balance Activities Side stepping  alternate stepping on incline/decline with CGA   Lumbar Exercises: Standing   Heel Raises 10 reps   Other Standing Lumbar Exercises bil. hip extension and abduction with green theraband x 10 reps; ankle sways inside bars without UE support x 10 reps   Knee/Hip Exercises: Standing   Forward Step Up 10 reps;Step Height: 6";Left  and RLE   Rocker Board 2 minutes  side to side and anterior/posteriorly                  PT Short Term Goals - 03/08/14 1002     PT SHORT TERM GOAL #2   Title Incr. gait velocity to >/= 1.9 ft/sec with cane           PT Long Term Goals - 03/09/14 1047    PT LONG TERM GOAL #1   Title Verbalize understanding of fall prevention strategies in home environment   Baseline met 03-08-14   Time 3   Period Weeks   Status Achieved   PT LONG TERM GOAL #2   Title incr. Berg score by at least 8 points   Baseline ;initial score 43/56   Time 3   Period Weeks   Status On-going   PT LONG TERM GOAL #3   Title incr. gait velocity to >/= 2.1 ft/sec with cane   Baseline 2.49 ft/sec    Time 3   Period Weeks   Status Achieved   PT LONG TERM GOAL #4   Title Amb. 250' with cane with SBA    Baseline met   Time 1   Period Weeks   Status Achieved   PT LONG TERM GOAL #5   Title Improve TUG score to </= 13.5 secs without device   Baseline met with score 12.21 secs on 03-08-14     Time 3   Period Weeks   Status Achieved   Additional Long Term Goals   Additional Long Term Goals Yes   PT LONG TERM GOAL #6   Title Improve Berg balance test score to >/= 51/56   Baseline 04-08-24            score 48/56 on 03-08-14   Time 4   Period Weeks   Status New   PT LONG TERM GOAL #7   Title Pt. will ambulate for 3" nonstop with cane to demo improved endurance/activity tolerance   Baseline 2 1/2" with cane on 03-08-14:             target date     04-08-14   Time 4   Period Weeks   Status New   PT LONG TERM GOAL #8   Title Negotiate 4 steps with cane with 1 rail using a step over step sequence for incr. efficiency.   Baseline 04-08-14 target date   Time 4   Period Weeks   Status New   PT LONG TERM GOAL  #9   TITLE Incr. gait velocity to >/= 3.0 ft/sec with cane   Baseline 2.42 without cane    target date 04-08-14   Time 4   Period Weeks   Status New               Plan - 04/08/14 1133    Clinical Impression Statement Pt. cont to need cues to stand erect and achieve initial heel contact during stance phase of gait   Pt will benefit  from skilled therapeutic intervention in order to improve on the following deficits Abnormal gait;Decreased endurance;Decreased activity tolerance;Decreased balance;Decreased strength   PT Frequency 2x / week   PT Duration 4 weeks   PT Treatment/Interventions Therapeutic activities;Patient/family education;Therapeutic exercise;Gait training;Balance training;Neuromuscular re-education;Stair training   PT Next Visit Plan check LTG's and renew   PT Home Exercise Plan Balance HEP as instructed previously   Consulted and Agree with Plan of Care Patient        Problem List There are no active problems to display for this patient.   Giannamarie, Paulus, PT 04/08/2014, 11:35 AM  [facility] [address] [place], [place], [address] Phone: [phone]   Fax:  [phone]

## 2014-04-18 ENCOUNTER — Ambulatory Visit: Payer: Medicare Other | Admitting: Physical Therapy

## 2014-04-21 ENCOUNTER — Ambulatory Visit: Payer: Medicare Other | Admitting: Physical Therapy

## 2014-04-25 ENCOUNTER — Encounter: Payer: Self-pay | Admitting: Physical Therapy

## 2014-04-25 ENCOUNTER — Ambulatory Visit: Payer: Medicare Other | Admitting: Physical Therapy

## 2014-04-25 DIAGNOSIS — R269 Unspecified abnormalities of gait and mobility: Secondary | ICD-10-CM

## 2014-04-25 DIAGNOSIS — Z5189 Encounter for other specified aftercare: Secondary | ICD-10-CM | POA: Diagnosis not present

## 2014-04-25 NOTE — Therapy (Signed)
[facility] [address] [place], [place], [address] Phone: [phone]   Fax:  [phone]  Physical Therapy Treatment  Patient Details  Name: Deborah Gallegos MRN: [id number] Date of Birth: [birth year] Referring Provider:  Lucianne Lei, MD  Encounter Date: 04/25/2014    Past Medical History  Diagnosis Date  . Seizure   . Hypertension     Past Surgical History  Procedure Laterality Date  . Abdominal hysterectomy    . Brain tumor excision      There were no vitals taken for this visit.  Visit Diagnosis:  No diagnosis found.      Subjective Assessment - 04/25/14 2130    Symptoms Pt. states she had to walk last Tues. when she had jury duty in [place] - became exhausted due to long distance and not bringing cane - was able to get assit. to her car when leaving   Currently in Pain? No/denies                    [facility] Adult PT Treatment/Exercise - 04/25/14 0001    Ambulation/Gait   Ambulation/Gait Assistance 5: Supervision   Ambulation Distance (Feet) 240 Feet   Assistive device None   Dynamic Standing Balance   Dynamic Standing - Balance Support During functional activity   High Level Balance   High Level Balance Activities Side stepping  alternate stepping on incline/decline with CGA   Lumbar Exercises: Standing   Heel Raises 10 reps   Other Standing Lumbar Exercises bil. hip extension and abduction with green theraband x 10 reps; ankle sways inside bars without UE support x 10 reps   Knee/Hip Exercises: Aerobic   Stationary Bike Nustep level 4 x 5" x 10"   Knee/Hip Exercises: Standing   Forward Step Up 10 reps;Step Height: 6";Left  and RLE   Rocker Board 1 minute    NeuroRe-ed: standing on rockerboard - alternate UE flexion x 10 reps; standing on blue foam holding ball overhead  to fascilitate core stabilization and upright posture  Gait; pt. Amb. 120' holding ball out in front -  attempted to hold overhead but too difficult            PT Short Term Goals - 03/08/14 1002    PT SHORT TERM GOAL #2   Title Incr. gait velocity to >/= 1.9 ft/sec with cane           PT Long Term Goals - 03/09/14 1047    PT LONG TERM GOAL #1   Title Verbalize understanding of fall prevention strategies in home environment   Baseline met 03-08-14   Time 3   Period Weeks   Status Achieved   PT LONG TERM GOAL #2   Title incr. Berg score by at least 8 points   Baseline ;initial score 43/56   Time 3   Period Weeks   Status On-going   PT LONG TERM GOAL #3   Title incr. gait velocity to >/= 2.1 ft/sec with cane   Baseline 2.49 ft/sec    Time 3   Period Weeks   Status Achieved   PT LONG TERM GOAL #4   Title Amb. 250' with cane with SBA    Baseline met   Time 1   Period Weeks   Status Achieved   PT LONG TERM GOAL #5   Title Improve TUG score to </= 13.5 secs without device   Baseline met with score 12.21 secs on 03-08-14  Time 3   Period Weeks   Status Achieved   Additional Long Term Goals   Additional Long Term Goals Yes   PT LONG TERM GOAL #6   Title Improve Berg balance test score to >/= 51/56   Baseline 04-08-24            score 48/56 on 03-08-14   Time 4   Period Weeks   Status New   PT LONG TERM GOAL #7   Title Pt. will ambulate for 3" nonstop with cane to demo improved endurance/activity tolerance   Baseline 2 1/2" with cane on 03-08-14:             target date     04-08-14   Time 4   Period Weeks   Status New   PT LONG TERM GOAL #8   Title Negotiate 4 steps with cane with 1 rail using a step over step sequence for incr. efficiency.   Baseline 04-08-14 target date   Time 4   Period Weeks   Status New   PT LONG TERM GOAL  #9   TITLE Incr. gait velocity to >/= 3.0 ft/sec with cane   Baseline 2.42 without cane    target date 04-08-14   Time 4   Period Weeks   Status New               Problem List There are no active problems to display for this  patient.   Viki, Carrera, PT 04/25/2014, 9:37 PM  [facility] [address] [place], [place], [address] Phone: [phone]   Fax:  [phone]

## 2014-04-28 ENCOUNTER — Ambulatory Visit: Payer: Medicare Other | Admitting: Physical Therapy

## 2014-05-02 ENCOUNTER — Encounter: Payer: Self-pay | Admitting: Physical Therapy

## 2014-05-02 ENCOUNTER — Ambulatory Visit: Payer: Medicare Other | Admitting: Physical Therapy

## 2014-05-02 DIAGNOSIS — Z5189 Encounter for other specified aftercare: Secondary | ICD-10-CM | POA: Diagnosis not present

## 2014-05-02 DIAGNOSIS — R269 Unspecified abnormalities of gait and mobility: Secondary | ICD-10-CM

## 2014-05-02 NOTE — Therapy (Signed)
[facility] [address] [place], [place], [address] Phone: [phone]   Fax:  [phone]  Physical Therapy Treatment  Patient Details  Name: Deborah Gallegos MRN: [id number] Date of Birth: 1950-09-17 Referring Provider:  Lucianne Lei, MD  Encounter Date: 05/02/2014      PT End of Session - 05/02/14 2234    Visit Number 20   Number of Visits 24   Date for PT Re-Evaluation 05/03/14   Authorization Type [employer] Medicare   PT Start Time 7035   PT Stop Time 1449   PT Time Calculation (min) 47 min      Past Medical History  Diagnosis Date  . Seizure   . Hypertension     Past Surgical History  Procedure Laterality Date  . Abdominal hysterectomy    . Brain tumor excision      There were no vitals taken for this visit.  Visit Diagnosis:  No diagnosis found.      Subjective Assessment - 05/02/14 2232    Symptoms Pt. states she has been doing fine - ready to finish up this week   Currently in Pain? No/denies                    [facility] Adult PT Treatment/Exercise - 05/02/14 0001    Ambulation/Gait   Ambulation/Gait Assistance 5: Supervision   Ambulation Distance (Feet) 240 Feet   Assistive device None   Dynamic Standing Balance   Dynamic Standing - Balance Support During functional activity   High Level Balance   High Level Balance Activities Side stepping  alternate stepping on incline/decline with CGA   Lumbar Exercises: Standing   Heel Raises 10 reps   Other Standing Lumbar Exercises bil. hip extension and abduction with green theraband x 10 reps; ankle sways inside bars without UE support x 10 reps   Knee/Hip Exercises: Aerobic   Stationary Bike Nustep level 5 x 6" with UE's and LE's   Knee/Hip Exercises: Standing   Forward Step Up 10 reps;Step Height: 6";Left  and RLE   Rocker Board 1 minute                  PT Short Term Goals - 03/08/14 1002    PT SHORT TERM GOAL #2   Title Incr. gait velocity to >/= 1.9 ft/sec with cane           PT Long Term Goals - 03/09/14 1047    PT LONG TERM GOAL #1   Title Verbalize understanding of fall prevention strategies in home environment   Baseline met 03-08-14   Time 3   Period Weeks   Status Achieved   PT LONG TERM GOAL #2   Title incr. Berg score by at least 8 points   Baseline ;initial score 43/56   Time 3   Period Weeks   Status On-going   PT LONG TERM GOAL #3   Title incr. gait velocity to >/= 2.1 ft/sec with cane   Baseline 2.49 ft/sec    Time 3   Period Weeks   Status Achieved   PT LONG TERM GOAL #4   Title Amb. 250' with cane with SBA    Baseline met   Time 1   Period Weeks   Status Achieved   PT LONG TERM GOAL #5   Title Improve TUG score to </= 13.5 secs without device   Baseline met with score 12.21 secs on 03-08-14   Time 3  Period Weeks   Status Achieved   Additional Long Term Goals   Additional Long Term Goals Yes   PT LONG TERM GOAL #6   Title Improve Berg balance test score to >/= 51/56   Baseline 04-08-24            score 48/56 on 03-08-14   Time 4   Period Weeks   Status New   PT LONG TERM GOAL #7   Title Pt. will ambulate for 3" nonstop with cane to demo improved endurance/activity tolerance   Baseline 2 1/2" with cane on 03-08-14:             target date     04-08-14   Time 4   Period Weeks   Status New   PT LONG TERM GOAL #8   Title Negotiate 4 steps with cane with 1 rail using a step over step sequence for incr. efficiency.   Baseline 04-08-14 target date   Time 4   Period Weeks   Status New   PT LONG TERM GOAL  #9   TITLE Incr. gait velocity to >/= 3.0 ft/sec with cane   Baseline 2.42 without cane    target date 04-08-14   Time 4   Period Weeks   Status New               Plan - 19-May-2014 2235    Clinical Impression Statement Pt. progressing well towards LTG's   Pt will benefit from skilled therapeutic intervention in order to improve on the following deficits  Abnormal gait;Decreased endurance;Decreased activity tolerance;Decreased balance;Decreased strength   Rehab Potential Good   PT Frequency 2x / week   PT Duration 4 weeks   PT Treatment/Interventions Therapeutic activities;Patient/family education;Therapeutic exercise;Gait training;Balance training;Neuromuscular re-education;Stair training   PT Next Visit Plan D/C next session - check LTG's   PT Home Exercise Plan Balance HEP as instructed previously   Consulted and Agree with Plan of Care Patient          G-Codes - 05/19/14 2235/05/19    Functional Assessment Tool Used clinical judgment   Functional Limitation Mobility: Walking and moving around   Mobility: Walking and Moving Around Current Status (I6270) At least 20 percent but less than 40 percent impaired, limited or restricted   Mobility: Walking and Moving Around Goal Status [phone]) At least 20 percent but less than 40 percent impaired, limited or restricted      Problem List There are no active problems to display for this patient.   Rainah, Kirshner, PT May 19, 2014, 10:40 PM  [facility] [address] [place], [place], [address] Phone: [phone]   Fax:  [phone]

## 2014-05-05 ENCOUNTER — Ambulatory Visit: Payer: Medicare Other | Attending: Family Medicine | Admitting: Physical Therapy

## 2014-05-05 ENCOUNTER — Encounter: Payer: Self-pay | Admitting: Physical Therapy

## 2014-05-05 DIAGNOSIS — Z5189 Encounter for other specified aftercare: Secondary | ICD-10-CM | POA: Diagnosis present

## 2014-05-05 DIAGNOSIS — R269 Unspecified abnormalities of gait and mobility: Secondary | ICD-10-CM | POA: Diagnosis not present

## 2014-05-05 DIAGNOSIS — Z8673 Personal history of transient ischemic attack (TIA), and cerebral infarction without residual deficits: Secondary | ICD-10-CM | POA: Diagnosis not present

## 2014-05-05 DIAGNOSIS — R569 Unspecified convulsions: Secondary | ICD-10-CM | POA: Insufficient documentation

## 2014-05-05 NOTE — Therapy (Signed)
[facility] [address] [place], [place], [address] Phone: [phone]   Fax:  [phone]  Physical Therapy Treatment  Patient Details  Name: Deborah Gallegos MRN: [id number] Date of Birth: 12/29/1950 Referring Provider:  Bland, Veita, MD  Encounter Date: 05/05/2014      PT End of Session - 05/05/14 1716    Visit Number 21   Number of Visits 24   Date for PT Re-Evaluation 05/03/14   Authorization Type [employer] Medicare   PT Start Time 1415   PT Stop Time 1453   PT Time Calculation (min) 38 min      Past Medical History  Diagnosis Date  . Seizure   . Hypertension     Past Surgical History  Procedure Laterality Date  . Abdominal hysterectomy    . Brain tumor excision      There were no vitals taken for this visit.  Visit Diagnosis:  Abnormality of gait      Subjective Assessment - 05/05/14 1711    Symptoms Pt. states she has had trouble getting up and going today; went to try to find [facility] yesterday - could not find it but is going to try again   Currently in Pain? No/denies                    [facility] Adult PT Treatment/Exercise - 05/05/14 0001    Ambulation/Gait   Ambulation/Gait Assistance 5: Supervision   Ambulation Distance (Feet) 360 Feet  in 3" time period   Assistive device None   Berg Balance Test   Sit to Stand Able to stand without using hands and stabilize independently   Standing Unsupported Able to stand safely 2 minutes   Sitting with Back Unsupported but Feet Supported on Floor or Stool Able to sit safely and securely 2 minutes   Stand to Sit Sits safely with minimal use of hands   Transfers Able to transfer safely, minor use of hands   Standing Unsupported with Eyes Closed Able to stand 10 seconds safely   Standing Ubsupported with Feet Together Able to place feet together independently and stand 1 minute safely   From Standing, Reach Forward with Outstretched Arm  Can reach confidently >25 cm (10")   From Standing Position, Pick up Object from Floor Able to pick up shoe safely and easily   From Standing Position, Turn to Look Behind Over each Shoulder Looks behind from both sides and weight shifts well   Turn 360 Degrees Able to turn 360 degrees safely but slowly   Standing Unsupported, Alternately Place Feet on Step/Stool Able to stand independently and safely and complete 8 steps in 20 seconds  L=5.5 secs; R= 4.6   Standing Unsupported, One Foot in Front Able to place foot tandem independently and hold 30 seconds   Standing on One Leg Able to lift leg independently and hold equal to or more than 3 seconds   Total Score 52   Timed Up and Go Test   TUG Normal TUG   Normal TUG (seconds) 13.1   Knee/Hip Exercises: Aerobic   Stationary Bike Nustep level 5 x 6" with UE's and LE's                PT Education - 05/05/14 1715    Education provided Yes   Education Details added braiding to HEP and tapping front and laterally   Person(s) Educated Patient   Methods Explanation;Demonstration;Handout   Comprehension Verbalized   understanding;Returned demonstration          PT Short Term Goals - 03/08/14 1002    PT SHORT TERM GOAL #2   Title Incr. gait velocity to >/= 1.9 ft/sec with cane           PT Long Term Goals - 05/05/14 1719    PT LONG TERM GOAL #1   Title Verbalize understanding of fall prevention strategies in home environment   Baseline met 03-08-14   Status Achieved   PT LONG TERM GOAL #2   Title incr. Berg score by at least 8 points   Baseline goal met 05-05-14 with score 52/56   Status Achieved   PT LONG TERM GOAL #3   Title incr. gait velocity to >/= 2.1 ft/sec with cane   Baseline goal met with score 2.2 ft/sec on 05-05-14 with no device   Status Achieved   PT LONG TERM GOAL #4   Title Amb. 250' with cane with SBA    Status Achieved   PT LONG TERM GOAL #5   Title Improve TUG score to </= 13.5 secs without device    Baseline 13.19 secs with no device - 05-05-14   Status Achieved   PT LONG TERM GOAL #6   Title Improve Berg balance test score to >/= 51/56   Baseline score 52/56 on 05-05-14   Status Achieved   PT LONG TERM GOAL #7   Title Pt. will ambulate for 3" nonstop with cane to demo improved endurance/activity tolerance   Baseline walked 3" without cane on 05-05-14   Status Achieved   PT LONG TERM GOAL #8   Title Negotiate 4 steps with cane with 1 rail using a step over step sequence for incr. efficiency.   Baseline met 05-05-14   Status Achieved   PT LONG TERM GOAL  #9   TITLE Incr. gait velocity to >/= 3.0 ft/sec with cane   Baseline 2.22 on 05-05-14 with no device   Status Not Met               Plan - 05/05/14 1717    Clinical Impression Statement Pt. has met all LTG's - has plateaued in maximizing functional progres at this time   Rehab Potential Good   PT Frequency 2x / week   PT Duration 4 weeks   PT Treatment/Interventions Therapeutic activities;Patient/family education;Therapeutic exercise;Gait training;Balance training;Neuromuscular re-education;Stair training   PT Home Exercise Plan Balance HEP as instructed previously   Consulted and Agree with Plan of Care Patient          G-Codes - 05/05/14 1723    Functional Assessment Tool Used Berg score 52/56; TUG 13.1 secs with no device   Functional Limitation Mobility: Walking and moving around   Mobility: Walking and Moving Around Goal Status (G8979) At least 20 percent but less than 40 percent impaired, limited or restricted   Mobility: Walking and Moving Around Discharge Status (G8980) At least 20 percent but less than 40 percent impaired, limited or restricted    PHYSICAL THERAPY DISCHARGE SUMMARY  Visits from Start of Care: 21  Current functional level related to goals / functional outcomes: See above for progress    Remaining deficits: Cont. Decr. Endurance and decr. High level standing balance skills and decr. High level  gait with pt. Using a cane for assist. with community ambulation   Education / Equipment: Pt. Has been instructed in a HEP for balance exercises and has been given info on [facility] for   continuation of exercise program upon D/C from PT. Plan: Patient agrees to discharge.  Patient goals were met. Patient is being discharged due to meeting the stated rehab goals.  ?????     Problem List There are no active problems to display for this patient.   Dilday, Sonjia Suzanne, PT 05/05/2014, 5:25 PM   [facility] [address] , [place], [address] Phone: [phone]   Fax:  [phone]      

## 2014-05-09 ENCOUNTER — Ambulatory Visit: Payer: Medicare Other | Admitting: Physical Therapy

## 2014-05-12 ENCOUNTER — Ambulatory Visit: Payer: Medicare Other | Admitting: Physical Therapy

## 2015-07-26 ENCOUNTER — Encounter (HOSPITAL_COMMUNITY): Payer: Self-pay | Admitting: Emergency Medicine

## 2015-07-26 ENCOUNTER — Ambulatory Visit (HOSPITAL_COMMUNITY)
Admission: EM | Admit: 2015-07-26 | Discharge: 2015-07-26 | Disposition: A | Discharge status: Home / Self Care | Payer: Medicare Other | Attending: Family Medicine | Admitting: Family Medicine

## 2015-07-26 DIAGNOSIS — S0181XA Laceration without foreign body of other part of head, initial encounter: Secondary | ICD-10-CM

## 2015-07-26 DIAGNOSIS — Z23 Encounter for immunization: Secondary | ICD-10-CM

## 2015-07-26 MED ORDER — TETANUS-DIPHTH-ACELL PERTUSSIS 5-2.5-18.5 LF-MCG/0.5 IM SUSP
0.5000 mL | Freq: Once | INTRAMUSCULAR | Status: AC
  Administered 2015-07-26: 0.5 mL via INTRAMUSCULAR

## 2015-07-26 MED ORDER — TETANUS-DIPHTH-ACELL PERTUSSIS 5-2.5-18.5 LF-MCG/0.5 IM SUSP
INTRAMUSCULAR | Status: AC
  Filled 2015-07-26: qty 0.5

## 2015-07-26 NOTE — ED Notes (Addendum)
Patient reports losing balance and falling, hitting left side of head on a fire place.  Incident this morning.  No witness to event.  Son with patient and reports she is at baseline behavior.  Bleeding controlled, small laceration to left side of head

## 2015-07-26 NOTE — ED Provider Notes (Signed)
CSN: [id number]     Arrival date & time 07/26/15  1431 History   First MD Initiated Contact with Patient 07/26/15 1443     Chief Complaint  Patient presents with  . Fall  . Head Laceration   (Consider location/radiation/quality/duration/timing/severity/associated sxs/prior Treatment) Patient is a 65 y.o. female presenting with fall and scalp laceration. The history is provided by the patient.  Fall This is a new problem. The current episode started 1 to 2 hours ago (lost balance at home and hit left face on edge of fireplace, min bleeding.). The problem has not changed since onset.Pertinent negatives include no headaches.  Head Laceration Pertinent negatives include no headaches.    Past Medical History  Diagnosis Date  . Seizure ([facility])   . Hypertension    Past Surgical History  Procedure Laterality Date  . Abdominal hysterectomy    . Brain tumor excision     No family history on file. Social History  Substance Use Topics  . Smoking status: Never Smoker   . Smokeless tobacco: None  . Alcohol Use: No   OB History    No data available     Review of Systems  Constitutional: Negative.   Skin: Positive for wound.  Neurological: Negative for dizziness, facial asymmetry and headaches.  All other systems reviewed and are negative.   Allergies  Review of patient's allergies indicates no known allergies.  Home Medications   Prior to Admission medications   Medication Sig Start Date End Date Taking? Authorizing Provider  amLODipine-benazepril (LOTREL) 5-20 MG per capsule Take 1 capsule by mouth daily.    Historical Provider, MD  aspirin 81 MG tablet Take 81 mg by mouth daily.    Historical Provider, MD  hydrochlorothiazide (HYDRODIURIL) 50 MG tablet Take 50 mg by mouth daily.    Historical Provider, MD  lamoTRIgine (LAMICTAL) 200 MG tablet Take 200 mg by mouth 2 (two) times daily.    Historical Provider, MD  levETIRAcetam (KEPPRA) 500 MG tablet Take 500 mg by mouth every 12  (twelve) hours.    Historical Provider, MD  meclizine (ANTIVERT) 50 MG tablet Take 1 tablet (50 mg total) by mouth 3 (three) times daily as needed. 12/27/12   Tanna Furry, MD  rosuvastatin (CRESTOR) 10 MG tablet Take 10 mg by mouth daily.    Historical Provider, MD   Meds Ordered and Administered this Visit   Medications  Tdap (BOOSTRIX) injection 0.5 mL (not administered)    BP 175/104 mmHg  Pulse 70  Temp(Src) 97.9 F (36.6 C) (Oral)  Resp 20  SpO2 100% No data found.   Physical Exam  Constitutional: She is oriented to person, place, and time. She appears well-developed and well-nourished. No distress.  Eyes: Pupils are equal, round, and reactive to light.  Neurological: She is alert and oriented to person, place, and time.  Skin: Skin is warm and dry.  Nursing note and vitals reviewed.   ED Course  .Marland KitchenLaceration Repair Date/Time: 07/26/2015 3:10 PM Performed by: Billy Fischer Authorized by: Ihor Gully D Consent: Verbal consent obtained. Consent given by: patient Body area: head/neck Location details: forehead Laceration length: 1.5 cm Foreign bodies: no foreign bodies Tendon involvement: none Nerve involvement: none Vascular damage: no Preparation: Patient was prepped and draped in the usual sterile fashion. Irrigation solution: tap water Amount of cleaning: standard Debridement: none Degree of undermining: none Skin closure: glue Technique: simple Approximation: close Approximation difficulty: simple Patient tolerance: Patient tolerated the procedure well with no immediate complications   (  including critical care time)  Labs Review Labs Reviewed - No data to display  Imaging Review No results found.   Visual Acuity Review  Right Eye Distance:   Left Eye Distance:   Bilateral Distance:    Right Eye Near:   Left Eye Near:    Bilateral Near:         MDM   1. Forehead laceration, initial encounter        Billy Fischer, MD 07/26/15  1515

## 2015-07-26 NOTE — ED Notes (Signed)
Assisted and at bedside for Dr Duanne Limerick exam and application of dermabond

## 2015-07-26 NOTE — ED Notes (Signed)
Dr Juventino Slovak aware of abnormal blood pressure reading.

## 2016-10-22 ENCOUNTER — Encounter (HOSPITAL_COMMUNITY): Payer: Self-pay | Admitting: Emergency Medicine

## 2016-10-22 ENCOUNTER — Emergency Department (HOSPITAL_COMMUNITY)
Admission: EM | Admit: 2016-10-22 | Discharge: 2016-10-23 | Disposition: A | Discharge status: Home / Self Care | Payer: Medicare Other | Attending: Emergency Medicine | Admitting: Emergency Medicine

## 2016-10-22 DIAGNOSIS — I10 Essential (primary) hypertension: Secondary | ICD-10-CM | POA: Diagnosis not present

## 2016-10-22 DIAGNOSIS — Y92003 Bedroom of unspecified non-institutional (private) residence as the place of occurrence of the external cause: Secondary | ICD-10-CM | POA: Diagnosis not present

## 2016-10-22 DIAGNOSIS — H1132 Conjunctival hemorrhage, left eye: Secondary | ICD-10-CM | POA: Diagnosis not present

## 2016-10-22 DIAGNOSIS — Z79899 Other long term (current) drug therapy: Secondary | ICD-10-CM | POA: Diagnosis not present

## 2016-10-22 DIAGNOSIS — Y939 Activity, unspecified: Secondary | ICD-10-CM | POA: Diagnosis not present

## 2016-10-22 DIAGNOSIS — S0512XA Contusion of eyeball and orbital tissues, left eye, initial encounter: Secondary | ICD-10-CM

## 2016-10-22 DIAGNOSIS — Y92009 Unspecified place in unspecified non-institutional (private) residence as the place of occurrence of the external cause: Secondary | ICD-10-CM

## 2016-10-22 DIAGNOSIS — S0592XA Unspecified injury of left eye and orbit, initial encounter: Secondary | ICD-10-CM | POA: Diagnosis present

## 2016-10-22 DIAGNOSIS — S0012XA Contusion of left eyelid and periocular area, initial encounter: Secondary | ICD-10-CM | POA: Diagnosis not present

## 2016-10-22 DIAGNOSIS — S0083XA Contusion of other part of head, initial encounter: Secondary | ICD-10-CM | POA: Diagnosis not present

## 2016-10-22 DIAGNOSIS — Y999 Unspecified external cause status: Secondary | ICD-10-CM | POA: Insufficient documentation

## 2016-10-22 DIAGNOSIS — W01198A Fall on same level from slipping, tripping and stumbling with subsequent striking against other object, initial encounter: Secondary | ICD-10-CM | POA: Diagnosis not present

## 2016-10-22 DIAGNOSIS — W19XXXA Unspecified fall, initial encounter: Secondary | ICD-10-CM

## 2016-10-22 LAB — CBC
HEMATOCRIT: 41.8 % (ref 36.0–46.0)
Hemoglobin: 13.6 g/dL (ref 12.0–15.0)
MCH: 28.3 pg (ref 26.0–34.0)
MCHC: 32.5 g/dL (ref 30.0–36.0)
MCV: 86.9 fL (ref 78.0–100.0)
PLATELETS: 288 10*3/uL (ref 150–400)
RBC: 4.81 MIL/uL (ref 3.87–5.11)
RDW: 15.7 % — AB (ref 11.5–15.5)
WBC: 10.6 10*3/uL — ABNORMAL HIGH (ref 4.0–10.5)

## 2016-10-22 LAB — BASIC METABOLIC PANEL
Anion gap: 9 (ref 5–15)
BUN: 12 mg/dL (ref 6–20)
CO2: 26 mmol/L (ref 22–32)
CREATININE: 0.79 mg/dL (ref 0.44–1.00)
Calcium: 10.3 mg/dL (ref 8.9–10.3)
Chloride: 103 mmol/L (ref 101–111)
GFR calc Af Amer: >60 mL/min (ref >60)
GLUCOSE: 104 mg/dL — AB (ref 65–99)
POTASSIUM: 3.8 mmol/L (ref 3.5–5.1)
SODIUM: 138 mmol/L (ref 135–145)

## 2016-10-22 NOTE — ED Triage Notes (Signed)
Patient states that she was getting out of bed this morning and was little dizzy causing her to fall. Patient denies any LOC or talking blood thinners.  Patient has selling to left eye with pain. Patient reports blurred vision out of left eye.  Patient also reports still being dizzy. Patient may have taken to much of her medication per family member when he checked patient's pill box today.

## 2016-10-23 ENCOUNTER — Emergency Department (HOSPITAL_COMMUNITY): Payer: Medicare Other

## 2016-10-23 NOTE — Discharge Instructions (Signed)
Use ice packs over the bruised or swollen areas. Please call your ophthalmologist this morning to have your eye rechecked today. Return to the ED for any problems on the head injury sheet.

## 2016-10-23 NOTE — ED Notes (Signed)
Patients son reports patient has possibly taken more Lamictal than ordered for this past Thursday, Friday and Saturday. Pts son noticed the incorrect dose in her pill box for sundays dose which was: 2- 100 mg tabs, 1- 50 mg tab, and 2- 200 mg ER tabs. Patients correct dose should be 400 mg ER. Poison control being notified.

## 2016-10-23 NOTE — ED Notes (Signed)
Spoke with [employer].  Lamictal -tachycardia  -conduction changes -hypokalemia  -increased ataxia  Rec: check electrolytes, EKG - as long as pt is not more ataxic she is cleared by [facility] based on potassium level, EKG results, and VS

## 2016-10-23 NOTE — ED Provider Notes (Signed)
[facility] DEPT Provider Note   CSN: [id number] Arrival date & time: 10/22/16  1802  Time seen 23:58 PM   History   Chief Complaint Chief Complaint  Patient presents with  . Fall  . left eye pain  . possible OD on daily medications    HPI Deborah Gallegos is a 66 y.o. female.  HPI  Pt states she got dizzy when she got up out of bed this morning and sat down. However, even though she felt better, when she stood up again she fell, she said she was walking toward her walker when she fell and she hit her eye/face on the handlebar of her walker.She denies any other injury. She denies eye pain. She thinks she has some blurred vision compared to baseline now.  She did not tell her son until later in the afternoon that she had fallen.  Son states when he looked at her pill bottles she had taken her old Lamictal which was the 200 mg extended release tablets. He states they were switching her from the extended release to the regular Lamictal over the past couple weeks. He thinks she took 2 extra over the last 24 hours. Patient states she has some decreased vision in her left eye. She states her left eye is her good eye.  Patient has a history of a brain tumor and states while she was having surgery for that she suffered a stroke. 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 She is followed at [facility] by neurosurgery and ophthalmology. Patient's tetanus is up-to-date.  PCP Lucianne Lei, MD   Past Medical History:  Diagnosis Date  . Hypertension   . Seizure ([place])     There are no active problems to display for this patient.   Past Surgical History:  Procedure Laterality Date  . ABDOMINAL HYSTERECTOMY    . BRAIN TUMOR EXCISION      OB History    No data available       Home Medications    Prior to Admission medications   Medication Sig Start Date End Date Taking? Authorizing Provider  amLODipine-benazepril (LOTREL) 5-20 MG capsule Take 1 capsule by  mouth daily. 10/05/10  Yes [provider]  BRIVIACT 100 MG TABS Take 1 tablet by mouth 2 (two) times daily. 10/02/16  Yes [provider]  lamoTRIgine (LAMICTAL) 100 MG tablet Take 200-250 tablets by mouth 2 (two) times daily. 250 MG in the morning and 200 MG in the evening 10/01/16  Yes [provider]  rosuvastatin (CRESTOR) 10 MG tablet Take 10 mg by mouth daily.   Yes [provider]  meclizine (ANTIVERT) 50 MG tablet Take 1 tablet (50 mg total) by mouth 3 (three) times daily as needed. Patient not taking: Reported on 10/23/2016 12/27/12   Tanna Furry, MD    Family History No family history on file.  Social History Social History  Substance Use Topics  . Smoking status: Never Smoker  . Smokeless tobacco: Never Used  . Alcohol use No  lives at home Uses a walker Son stays with her    Allergies   Patient has no known allergies.   Review of Systems Review of Systems  All other systems reviewed and are negative.    Physical Exam Updated Vital Signs BP 139/66   Pulse 64   Temp 97.8 F (36.6 C) (Oral)   Resp 19   Ht 5\' 3"  (1.6 m)   SpO2 99%   Vital  signs normal    Physical Exam  Constitutional: She is oriented to person, place, and time. She appears well-developed and well-nourished.  Non-toxic appearance. She does not appear ill. No distress.  HENT:  Head: Normocephalic.  Right Ear: External ear normal.  Left Ear: External ear normal.  Nose: Nose normal. No mucosal edema or rhinorrhea.  Mouth/Throat: Oropharynx is clear and moist and mucous membranes are normal. No dental abscesses or uvula swelling.  Patient is tender to palpation over her left mid and lateral cheek area. There is no crepitance felt  Eyes: Pupils are equal, round, and reactive to light. EOM are normal. Left conjunctiva has a hemorrhage.  Patient has a subconjunctival hemorrhage mainly laterally. What I have patient cover her right eye she states she can see me  early, she can read my MD on my hospital badge but states she can't read the other letters on my badge while I'm sitting at her bedside about 5 feet from her.   Pt has some dried blood on her eyelids without obvious laceration. When the blood was cleaned off there is a small healing laceration on her upper eyelid near the lid margin that would be technically difficult to suture. It is well approximated and should heal well without intervention. She's also noted to have some bruising of her upper and lower eyelids.  Neck: Normal range of motion and full passive range of motion without pain. Neck supple.  Cardiovascular: Normal rate, regular rhythm and normal heart sounds.  Exam reveals no gallop and no friction rub.   No murmur heard. Pulmonary/Chest: Effort normal and breath sounds normal. No respiratory distress. She has no wheezes. She has no rhonchi. She has no rales. She exhibits no tenderness and no crepitus.  Abdominal: Soft. Normal appearance and bowel sounds are normal. She exhibits no distension. There is no tenderness. There is no rebound and no guarding.  Musculoskeletal: Normal range of motion. She exhibits no edema or tenderness.  Moves all extremities well.   Neurological: She is alert and oriented to person, place, and time. She has normal strength. No cranial nerve deficit.  Skin: Skin is warm, dry and intact. No rash noted. No erythema. No pallor.  Psychiatric: She has a normal mood and affect. Her speech is normal and behavior is normal. Her mood appears not anxious.  Nursing note and vitals reviewed.    Visual Acuity  Right Eye Distance: 20/50 Left Eye Distance: 0/0 (pt sts there was some defiency before fall but now she cant see anything out of it.) Bilateral Distance: 20/50  Right Eye Near:   Left Eye Near:    Bilateral Near:      ED Treatments / Results  Labs (all labs ordered are listed, but only abnormal results are displayed) Results for orders placed or  performed during the hospital encounter of 11/24/28  Basic metabolic panel  Result Value Ref Range   Sodium 138 135 - 145 mmol/L   Potassium 3.8 3.5 - 5.1 mmol/L   Chloride 103 101 - 111 mmol/L   CO2 26 22 - 32 mmol/L   Glucose, Bld 104 (H) 65 - 99 mg/dL   BUN 12 6 - 20 mg/dL   Creatinine, Ser 0.79 0.44 - 1.00 mg/dL   Calcium 10.3 8.9 - 10.3 mg/dL   GFR calc non Af Amer >60 >60 mL/min   GFR calc Af Amer >60 >60 mL/min   Anion gap 9 5 - 15  CBC  Result Value Ref Range  WBC 10.6 (H) 4.0 - 10.5 K/uL   RBC 4.81 3.87 - 5.11 MIL/uL   Hemoglobin 13.6 12.0 - 15.0 g/dL   HCT 41.8 36.0 - 46.0 %   MCV 86.9 78.0 - 100.0 fL   MCH 28.3 26.0 - 34.0 pg   MCHC 32.5 30.0 - 36.0 g/dL   RDW 15.7 (H) 11.5 - 15.5 %   Platelets 288 150 - 400 K/uL   Laboratory interpretation all normal except mild leukocytosis    EKG  EKG Interpretation  Date/Time:  Tuesday October 22 2016 18:45:42 EDT Ventricular Rate:  67 PR Interval:    QRS Duration: 79 QT Interval:  407 QTC Calculation: 430 R Axis:   -20 Text Interpretation:  Sinus rhythm Left ventricular hypertrophy Anterior Q waves, possibly due to LVH No significant change since last tracing 13 Jul 2010 Confirmed by Rolland Porter [phone]) on 10/23/2016 12:12:46 AM       Radiology Ct Maxillofacial Wo Cm  Result Date: 10/23/2016 CLINICAL DATA:  Patient fell out of bed yesterday morning. Dizziness. Left periorbital swelling and tenderness of the left cheek. EXAM: CT MAXILLOFACIAL WITHOUT CONTRAST TECHNIQUE: Multidetector CT imaging of the maxillofacial structures was performed. Multiplanar CT image reconstructions were also generated. COMPARISON:  None. FINDINGS: Osseous: No acute maxillofacial fracture. Evidence of prior right temporal craniotomy. Orbits: Evidence of prior right lens surgery. Intact orbits and globes. No retrobulbar hematoma. Extraocular muscles and optic nerves are unremarkable and symmetric in appearance. Sinuses: No acute sinus disease.   Clear mastoids. Soft tissues: Left periorbital and malar soft tissue swelling without hematoma. Limited intracranial: Sphenoid wing calcified meningioma is unchanged measuring up to 3 point 6 cm AP x 2.8 cm transverse on current exam. Adjacent encephalomalacia involving the right frontotemporal lobe is stable. IMPRESSION: 1. Left periorbital and malar soft tissue swelling without acute maxillofacial fracture. 2. 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 3. Status post right temporal craniotomy. Electronically Signed   By: Ashley Royalty M.D.   On: 10/23/2016 01:50    Procedures Procedures (including critical care time)  Medications Ordered in ED Medications - No data to display   Initial Impression / Assessment and Plan / ED Course  I have reviewed the triage vital signs and the nursing notes.  Pertinent labs & imaging results that were available during my care of the patient were reviewed by me and considered in my medical decision making (see chart for details).    CT of the maxillofacial bones was done due to her trauma. Visual acuity was done, however when I did her visual acuity at the bedside she could see me and read part of my badge with her right eye covered. We discussed following up later today with her ophthalmologist at [facility]. She has a subconjunctival hemorrhage and has had a blunt trauma to her eyeball. CT scan does not show any globe rupture. She can use ice packs for comfort. She was discussed with poison control who states no further intervention needed. Her son was advised that she should improve over the next 24 hours from taking extra of her Lamictal. We also discussed that she should have her walker in her hand before she gets up and walks. This morning she was walking to get her walker when she fell.    Final Clinical Impressions(s) / ED Diagnoses   Final diagnoses:    Fall at home, initial encounter  Contusion of left eye, initial  encounter  Subconjunctival hemorrhage of left eye  Contusion of face, initial encounter   Plan discharge  Rolland Porter, MD, Barbette Or, MD 10/23/16 [phone]

## 2016-12-26 ENCOUNTER — Ambulatory Visit: Payer: Medicare Other | Attending: Family Medicine | Admitting: Physical Therapy

## 2016-12-26 ENCOUNTER — Encounter: Payer: Self-pay | Admitting: Physical Therapy

## 2016-12-26 VITALS — BP 98/71 | HR 63

## 2016-12-26 DIAGNOSIS — R42 Dizziness and giddiness: Secondary | ICD-10-CM

## 2016-12-26 DIAGNOSIS — M6281 Muscle weakness (generalized): Secondary | ICD-10-CM

## 2016-12-26 DIAGNOSIS — R262 Difficulty in walking, not elsewhere classified: Secondary | ICD-10-CM

## 2016-12-26 DIAGNOSIS — R2681 Unsteadiness on feet: Secondary | ICD-10-CM | POA: Diagnosis present

## 2016-12-26 DIAGNOSIS — R296 Repeated falls: Secondary | ICD-10-CM | POA: Diagnosis not present

## 2016-12-26 NOTE — Therapy (Signed)
[facility] [address] [place], [place], [address] Phone: [phone]   Fax:  [phone]  Physical Therapy Evaluation  Patient Details  Name: Deborah Gallegos MRN: [id number] Date of Birth: 27-Dec-1950 Referring Provider: Lucianne Lei, MD  Encounter Date: 12/26/2016      PT End of Session - 12/26/16 2142    Visit Number 1   Number of Visits 17   Date for PT Re-Evaluation 02/24/17   Authorization Type [employer] Medicare $20 copay; G code and PN every 10th visit   PT Start Time 1015   PT Stop Time 1105   PT Time Calculation (min) 50 min   Activity Tolerance Patient tolerated treatment well   Behavior During Therapy [facility] for tasks assessed/performed      Past Medical History:  Diagnosis Date  . Hypertension   . Seizure [facility])     Past Surgical History:  Procedure Laterality Date  . ABDOMINAL HYSTERECTOMY    . BRAIN TUMOR EXCISION      Vitals:   12/26/16 1027  BP: 98/71  Pulse: 63         Subjective Assessment - 12/26/16 1027    Subjective Pt presents to OPPT for evaluation due to history of multiple falls and imbalance.  Pt reports having 7 falls just in October.  Pt did have one fall in August with injury after hitting her face/eye on RW.  Pt feels that the causes of her falls are her being "careless" and not paying attention to her feet placement, decreased vision-has glassess that she wears when reading and watching TV but does not wear them when ambulating.  Pt also reports dizziness when first arising from the bed or sitting.  Pt also expresses other issues: delayed processing, memory issues, fatigue, impaired depth perception and proprioception of UE placement.   Pertinent History previous PT with [facility] for back pain, gait abnormality 2016; HTN, seizure, brain tumor excision with resultant R visual impairments, CVA, R shoulder injury after fall, multiple falls   Limitations Walking   Patient Stated Goals  To be able to get up from the floor if I fall and improve balance   Currently in Pain? No/denies            [facility] PT Assessment - 12/26/16 1038      Assessment   Medical Diagnosis Balance, falls and impaired gait   Referring Provider Lucianne Lei, MD   Onset Date/Surgical Date 10/21/16   Prior Therapy 2 years ago for back pain, abnormal gait; HHPT     Precautions   Precautions Fall;Other (comment)   Precaution Comments HTN, seizure, brain tumor excision with resultant R visual impairments, CVA, R shoulder injury after fall     Balance Screen   Has the patient fallen in the past 6 months Yes   How many times? atleast 7 per month   Has the patient had a decrease in activity level because of a fear of falling?  No   Is the patient reluctant to leave their home because of a fear of falling?  No     Home Environment   Living Environment Private residence   Living Arrangements Children   Type of [place] to enter   Entrance Stairs-Number of Steps 2 sets of 2 stairs   Entrance Stairs-Rails Left   Home Layout One level   [place] - 4 wheels;Cane - single point   Additional Comments Has a hard  time negotiating stairs with one rail and one step with rollator     Prior Function   Level of Independence Independent with household mobility with device;Requires assistive device for independence     Cognition   Overall Cognitive Status History of cognitive impairments - at baseline     Observation/Other Assessments   Focus on Therapeutic Outcomes (FOTO)  34 (66% limited; predicted 51% limitation at D/C)     ROM / Strength   AROM / PROM / Strength Strength     Strength   Overall Strength Deficits   Overall Strength Comments RLE: 4/5 overall, LLE: 2/5 hip flexion, 4-/5 knee flexion/extension and ankle DF     Bed Mobility   Bed Mobility Supine to Sit;Sit to Supine   Supine to Sit 3: Mod assist;HOB flat  reports falling when getting OOB, foot  slides on sheets   Sit to Supine 4: Min assist   Sit to Supine - Details (indicate cue type and reason) to bring LLE fully onto mat     Ambulation/Gait   Ambulation/Gait Yes   Ambulation/Gait Assistance 5: Supervision   Ambulation/Gait Assistance Details intermittent verbal cues to attend to position of L foot and pivoting safely with rollator   Ambulation Distance (Feet) 100 Feet   Assistive device Rollator   Gait Pattern Step-to pattern;Step-through pattern;Decreased step length - left;Decreased stride length;Decreased hip/knee flexion - left;Decreased dorsiflexion - left;Trunk flexed;Poor foot clearance - left   Ambulation Surface Level;Indoor     Standardized Balance Assessment   Standardized Balance Assessment Berg Balance Test;Five Times Sit to Stand;10 meter walk test   10 Meter Walk 26.12 seconds or 1.71ft/sec            Vestibular Assessment - 12/26/16 1051      Orthostatics   BP supine (x 5 minutes) 108/70   HR supine (x 5 minutes) 56   BP sitting 60/42   HR sitting 78   BP standing (after 1 minute) 134/92   HR standing (after 1 minute) 69   BP standing (after 3 minutes) 139/87   HR standing (after 3 minutes) 72        Objective measurements completed on examination: See above findings.                  PT Education - 12/26/16 2142    Education provided Yes   Education Details PT POC, goals and impairments identified; recommendation to remain sitting EOB x 3 minutes before standing and to stand x 1 min before ambulating to allow BP to regulate    Person(s) Educated Patient   Methods Explanation   Comprehension Verbalized understanding          PT Short Term Goals - 12/26/16 2155      PT SHORT TERM GOAL #1   Title Participate in further assessment of LE strength and balance with five time sit to stand and BERG   Time 4   Period Weeks   Status New   Target Date 01/25/17     PT SHORT TERM GOAL #2   Title Pt will participate in more  indepth vestibular assessment due to dizziness   Time 4   Period Weeks   Status New   Target Date 01/25/17     PT SHORT TERM GOAL #3   Title Pt will perform safe bed mobility on flat bed MOD I (including sitting EOB x 3 minutes to allow BP to regulate)   Time 4   Period Weeks  Status New   Target Date 01/25/17     PT SHORT TERM GOAL #4   Title Pt will decrease falls risk with gait as indicated by increase in gait velocity to > or = 1.8 ft/sec with rollator   Baseline 1.25 ft/sec with rollator   Time 4   Period Weeks   Status New   Target Date 01/25/17           PT Long Term Goals - 12/26/16 2159      PT LONG TERM GOAL #1   Title Will be independent with HEP   Time 8   Period Weeks   Status New   Target Date 02/24/17     PT LONG TERM GOAL #2   Title Increase BERG score by 8 points to decrease falls risk   Baseline TBD   Time 8   Period Weeks   Status New   Target Date 02/24/17     PT LONG TERM GOAL #3   Title Decrease five time sit to stand by 4 seconds to indicate increased functional LE strength   Baseline TBD   Time 8   Period Weeks   Status New   Target Date 02/24/17     PT LONG TERM GOAL #4   Title Will increase gait velocity to > or = 2.0 ft/sec with rollator and improved L foot clearance on level surfaces   Baseline 1.25 on eval;    Time 8   Period Weeks   Status New   Target Date 02/24/17     PT LONG TERM GOAL #5   Title Will negotiate 1 step x 4 reps with rollator safely with supervision   Time 8   Period Weeks   Status New   Target Date 02/24/17     Additional Long Term Goals   Additional Long Term Goals Yes     PT LONG TERM GOAL #6   Title Pt will demonstrate safe floor <> stand transfer with UE support and supervision   Time 8   Period Weeks   Status New   Target Date 02/24/17     PT LONG TERM GOAL #7   Title Pt will improve FOTO by 10 points   Baseline 34 (66% limited)   Time 8   Period Weeks   Status New   Target Date  02/24/17                Plan - 12/26/16 2143    Clinical Impression Statement Pt is a 66 year old female presenting to OPPT neuro for evaluation of gait, imbalance and falls.  Pt experienced fall at home 10/21/16 when she became dizzy after standing up from her bed; pt hit her eye/head on her RW when she fell with resultant subconjunctival hemorrhage.  Pt continues to experience multiple falls, many related to dizziness. Pt's PMH significant for the following: HTN, seizure, brain tumor excision with resultant R visual impairments, CVA, R shoulder injury after fall. The following deficits were noted during pt's exam: visual impairments, dizziness/lightheadedness, significant BP changes during transitional movements, impaired strength, delayed processing, impaired motor planning/sequencing, impaired balance and postural control, impaired transfers and gait.  Pt's gait speed indicates pt is at recurrent risk for falls.  Pt would benefit from skilled PT to address these impairments and functional limitations to maximize functional mobility independence and reduce falls risk.   History and Personal Factors relevant to plan of care: HTN, seizure, brain tumor excision with resultant R visual  impairments, CVA, R shoulder injury after fall, multiple falls, dizziness.  Previous therapy with good results   Clinical Presentation Evolving   Clinical Presentation due to: HTN, h/o seizure, brain tumor excision with resultant R visual impairments, h/o CVA, R shoulder injury after fall, multiple falls, dizziness, BP changes during transitions, cognitive impairments   Clinical Decision Making Moderate   Rehab Potential Good   PT Frequency 2x / week   PT Duration 8 weeks   PT Treatment/Interventions ADLs/Self Care Home Management;DME Instruction;Gait training;Stair training;Functional mobility training;Therapeutic activities;Therapeutic exercise;Balance training;Neuromuscular re-education;Cognitive  remediation;Patient/family education;Orthotic Fit/Training;Vestibular   PT Next Visit Plan MONITOR VITALS-CAN DROP LOW OR GO HIGH-MAY BE REASON FOR DIZZINESS/FALLS; assess five time sit to stand, BERG and revise baselines; vestibular eval; work on balance reactions, one step negotiation with RW, stairs, floor transfer, bed mobility, gait training/L NMR   Recommended Other Services OT?   Consulted and Agree with Plan of Care Patient      Patient will benefit from skilled therapeutic intervention in order to improve the following deficits and impairments:  Abnormal gait, Decreased balance, Decreased cognition, Decreased mobility, Decreased strength, Difficulty walking, Dizziness, Impaired UE functional use, Impaired vision/preception, Postural dysfunction  Visit Diagnosis: Repeated falls  Dizziness and giddiness  Unsteadiness on feet  Muscle weakness (generalized)  Difficulty in walking, not elsewhere classified      G-Codes - 2017-01-07 June 12, 2203    Functional Assessment Tool Used (Outpatient Only) FOTO 34 (66% limited); gait velocity 1.25 (household)   Functional Limitation Mobility: Walking and moving around   Mobility: Walking and Moving Around Current Status [phone]) At least 60 percent but less than 80 percent impaired, limited or restricted   Mobility: Walking and Moving Around Goal Status [phone]) At least 40 percent but less than 60 percent impaired, limited or restricted       Problem List There are no active problems to display for this patient.   Rico Junker, PT, DPT 01-07-17    10:06 PM    [facility] [address] [place], [place], [address] Phone: [phone]   Fax:  [phone]  Name: Deborah Gallegos MRN: [id number] Date of Birth: 01-20-1951

## 2017-01-14 ENCOUNTER — Ambulatory Visit: Payer: Medicare Other | Attending: Family Medicine | Admitting: Physical Therapy

## 2017-01-14 DIAGNOSIS — R42 Dizziness and giddiness: Secondary | ICD-10-CM | POA: Insufficient documentation

## 2017-01-14 DIAGNOSIS — M6281 Muscle weakness (generalized): Secondary | ICD-10-CM | POA: Insufficient documentation

## 2017-01-14 DIAGNOSIS — R2681 Unsteadiness on feet: Secondary | ICD-10-CM | POA: Insufficient documentation

## 2017-01-14 DIAGNOSIS — R296 Repeated falls: Secondary | ICD-10-CM | POA: Insufficient documentation

## 2017-01-14 DIAGNOSIS — R262 Difficulty in walking, not elsewhere classified: Secondary | ICD-10-CM | POA: Insufficient documentation

## 2017-01-16 ENCOUNTER — Encounter: Payer: Self-pay | Admitting: Physical Therapy

## 2017-01-16 ENCOUNTER — Ambulatory Visit: Payer: Medicare Other | Admitting: Physical Therapy

## 2017-01-16 VITALS — BP 120/79

## 2017-01-16 DIAGNOSIS — R296 Repeated falls: Secondary | ICD-10-CM | POA: Diagnosis present

## 2017-01-16 DIAGNOSIS — M6281 Muscle weakness (generalized): Secondary | ICD-10-CM | POA: Diagnosis not present

## 2017-01-16 DIAGNOSIS — R262 Difficulty in walking, not elsewhere classified: Secondary | ICD-10-CM | POA: Diagnosis present

## 2017-01-16 DIAGNOSIS — R2681 Unsteadiness on feet: Secondary | ICD-10-CM

## 2017-01-16 DIAGNOSIS — R42 Dizziness and giddiness: Secondary | ICD-10-CM | POA: Diagnosis present

## 2017-01-16 NOTE — Therapy (Signed)
[facility] [address] [place], [place], [address] Phone: [phone]   Fax:  [phone]  Physical Therapy Treatment  Patient Details  Name: Deborah Gallegos MRN: [id number] Date of Birth: May 28, 1950 Referring Provider: Lucianne Lei, MD   Encounter Date: 01/16/2017  PT End of Session - 01/16/17 1058    Visit Number  2    Number of Visits  17    Authorization Type  [employer] Medicare $20 copay; G code and PN every 10th visit    PT Start Time  0935    PT Stop Time  1024    PT Time Calculation (min)  49 min    Equipment Utilized During Treatment  Gait belt       Past Medical History:  Diagnosis Date  . Hypertension   . Seizure [facility])     Past Surgical History:  Procedure Laterality Date  . ABDOMINAL HYSTERECTOMY    . BRAIN TUMOR EXCISION      Vitals:   01/16/17 0938  BP: 120/79    Subjective Assessment - 01/16/17 1204    Subjective  Pt reports she fell on rollator about 2-3 weeks ago and fell on right shoulder and then fell again on Sunday (11-11) which aggravated the Rt shoulder more; reports her Rt shoulder hurts when she lifts it up (flexion) and with lifting it out to side (abduction)    Currently in Pain?  Yes    Pain Score  2     Pain Location  Shoulder    Pain Orientation  Right    Pain Descriptors / Indicators  Aching    Pain Frequency  Intermittent    Aggravating Factors   flexion, abduction make it worse    Pain Relieving Factors  Motrin                      [facility] Adult PT Treatment/Exercise - 01/16/17 0949      Bed Mobility   Bed Mobility  Sit to Sidelying Left;Sit to Supine    Supine to Sit  5: Supervision    Sit to Supine  5: Supervision cues to go to left sidelying position from sitting    Sit to Sidelying Left  5: Supervision      Transfers   Transfers  Sit to Stand    Sit to Stand  5: Supervision    Number of Reps  10 reps 5 from mat without UE support, 5 from chair  w/bil. UE suppor    Comments  Sit to stand score impacted by c/o light-headedness with increased speed with this transfer:  score 16.56 secs from mat with no UE support:  15.85 secs from chair with bil. UE support on chair - pt did not totally let go of UE's from chair so this score not completely acccurate                                                                     Ambulation/Gait   Ambulation/Gait  Yes    Ambulation/Gait Assistance  5: Supervision    Ambulation/Gait Assistance Details  cues to place Lt heel down first for incr. foot clearance to reduce scuffing     Ambulation Distance (Feet)  100 Feet    Assistive device  Rollator    Gait Pattern  Step-to pattern;Step-through pattern;Decreased step length - left;Decreased stride length;Decreased hip/knee flexion - left;Decreased dorsiflexion - left;Trunk flexed;Poor foot clearance - left    Ambulation Surface  Level;Indoor    Stairs  Yes    Curb  4: Min assist to simulate pt's 1 step at home (no rails present)    Gait Comments  practiced step negoitation forward and then backward -- pt had slight LOB with lifting RW onto step in preparation for stepping up onto curb area;  recommended pt stepping up backward with RW on floor, then pulling back wheels of RW up on top, stepping back 1-2 steps and pulling front wheels of RW up on top; pt had no LOB with this technique       Self-Care   Self-Care  Other Self-Care Comments    Other Self-Care Comments   Discussed techniques to reduce light-headedness with sit to stand and with supine to sit transfers including ankle pumps in seated position prior to standing, getting up SLOWLY from seated position and marching in place prior to stepping, moving 1 foot slightly forward for incr. anterior weight shift to minimize LOB posteriorly and focusing on 1 object for target to minimize dizziness/light-headedness in standing                      Neuro Re-ed    Neuro Re-ed Details   Pt performed lateral  and anterior weight shifts standing at mat with RW in front 10 reps each direction for improved weight shift and limits of stability training      Exercises   Exercises  Knee/Hip      Knee/Hip Exercises: Supine   Bridges  1 set;10 reps             PT Education - 01/16/17 1058    Education provided  Yes    Education Details  bridging, weight shifts laterally and anterior/posteriorly    Person(s) Educated  Patient    Methods  Explanation;Demonstration;Handout    Comprehension  Verbalized understanding;Returned demonstration       PT Short Term Goals - 12/26/16 2155      PT SHORT TERM GOAL #1   Title  Participate in further assessment of LE strength and balance with five time sit to stand and BERG    Time  4    Period  Weeks    Status  New    Target Date  01/25/17      PT SHORT TERM GOAL #2   Title  Pt will participate in more indepth vestibular assessment due to dizziness    Time  4    Period  Weeks    Status  New    Target Date  01/25/17      PT SHORT TERM GOAL #3   Title  Pt will perform safe bed mobility on flat bed MOD I (including sitting EOB x 3 minutes to allow BP to regulate)    Time  4    Period  Weeks    Status  New    Target Date  01/25/17      PT SHORT TERM GOAL #4   Title  Pt will decrease falls risk with gait as indicated by increase in gait velocity to > or = 1.8 ft/sec with rollator    Baseline  1.25 ft/sec with rollator    Time  4    Period  Weeks    Status  New    Target Date  01/25/17        PT Long Term Goals - 12/26/16 2159      PT LONG TERM GOAL #1   Title  Will be independent with HEP    Time  8    Period  Weeks    Status  New    Target Date  02/24/17      PT LONG TERM GOAL #2   Title  Increase BERG score by 8 points to decrease falls risk    Baseline  TBD    Time  8    Period  Weeks    Status  New    Target Date  02/24/17      PT LONG TERM GOAL #3   Title  Decrease five time sit to stand by 4 seconds to indicate  increased functional LE strength    Baseline  TBD    Time  8    Period  Weeks    Status  New    Target Date  02/24/17      PT LONG TERM GOAL #4   Title  Will increase gait velocity to > or = 2.0 ft/sec with rollator and improved L foot clearance on level surfaces    Baseline  1.25 on eval;     Time  8    Period  Weeks    Status  New    Target Date  02/24/17      PT LONG TERM GOAL #5   Title  Will negotiate 1 step x 4 reps with rollator safely with supervision    Time  8    Period  Weeks    Status  New    Target Date  02/24/17      Additional Long Term Goals   Additional Long Term Goals  Yes      PT LONG TERM GOAL #6   Title  Pt will demonstrate safe floor <> stand transfer with UE support and supervision    Time  8    Period  Weeks    Status  New    Target Date  02/24/17      PT LONG TERM GOAL #7   Title  Pt will improve FOTO by 10 points    Baseline  34 (66% limited)    Time  8    Period  Weeks    Status  New    Target Date  02/24/17            Plan - 01/16/17 1101    Clinical Impression Statement  Pt safer with negotiating the 1 step backwards (rather than forwards) and pulling RW up gradually with back wheels, then front wheels.  Pt had slight LOB posteriorly with attempting to negotiate step forward and lifting RW up on top of step. Pt appears to have orthostatic hypotension with transitional movements but symptoms were reduced with slower movements.                                                                                 Rehab Potential  Good    PT Frequency  2x / week    PT Duration  8 weeks    PT Treatment/Interventions  ADLs/Self Care Home Management;DME Instruction;Gait training;Stair training;Functional mobility training;Therapeutic activities;Therapeutic exercise;Balance training;Neuromuscular re-education;Cognitive remediation;Patient/family education;Orthotic Fit/Training;Vestibular    PT Next Visit Plan  do Berg (not completed on 01-16-17 due  to time constraint) and revise baselines; check HEP and add balance exs as appropriate:  floor transfer when appropriate    Consulted and Agree with Plan of Care  Patient       Patient will benefit from skilled therapeutic intervention in order to improve the following deficits and impairments:  Abnormal gait, Decreased balance, Decreased cognition, Decreased mobility, Decreased strength, Difficulty walking, Dizziness, Impaired UE functional use, Impaired vision/preception, Postural dysfunction  Visit Diagnosis: Unsteadiness on feet  Muscle weakness (generalized)     Problem List There are no active problems to display for this patient.   [place], [place], PT 01/16/2017, 12:29 PM  [facility] [address] [place], [place], [address] Phone: [phone]   Fax:  [phone]  Name: AUSTRALIA DROLL MRN: [id number] Date of Birth: 11/07/1950

## 2017-01-16 NOTE — Patient Instructions (Signed)
http://ss.exer.us/365  Bridging    Lie on back with feet shoulder width apart. Lift hips toward the ceiling. Hold ____ seconds. Repeat _10___ times. Do __1__ sessions per day.  http://gt2.exer.us/357   Copyright  VHI. All rights reserved.   Copyright  VHI. All rights reserved.  Weight Shift: Anterior / Posterior (Limits of Stability)    Slowly shift weight backward until toes begin to rise off floor. Return to starting position. Shift weight slowly forward until heels begin to rise off floor. Hold each position _2-3___ seconds. Repeat _10___ times per session. Do _1__ sessions per day.   STEP FORWARD with one LEG and shift weight onto front - heel on back foot comes up off floor - do each side with each foot forward Copyright  VHI. All rights reserved.  Weight Shift: Lateral (Limits of Stability)    Slowly shift weight to right as far as possible, without taking a step. Return to starting position. Shift to opposite side. Hold each position __2-3__ seconds. Repeat __10__ times per session. Do __1_ sessions per day.   Copyright  VHI. All rights reserved.

## 2017-01-21 ENCOUNTER — Encounter: Payer: Self-pay | Admitting: Physical Therapy

## 2017-01-21 ENCOUNTER — Ambulatory Visit: Payer: Medicare Other | Admitting: Physical Therapy

## 2017-01-21 VITALS — BP 124/84 | HR 66

## 2017-01-21 DIAGNOSIS — M6281 Muscle weakness (generalized): Secondary | ICD-10-CM | POA: Diagnosis not present

## 2017-01-21 DIAGNOSIS — R2681 Unsteadiness on feet: Secondary | ICD-10-CM

## 2017-01-21 NOTE — Therapy (Signed)
[facility] [address] [place], [place], [address] Phone: [phone]   Fax:  [phone]  Physical Therapy Treatment  Patient Details  Name: Deborah Gallegos MRN: [id number] Date of Birth: 04-04-1950 Referring Provider: Lucianne Lei, MD   Encounter Date: 01/21/2017  PT End of Session - 01/21/17 0906    Visit Number  3    Number of Visits  17    Authorization Type  [employer] Medicare $20 copay; G code and PN every 10th visit    PT Start Time  0903 Pt late for session    PT Stop Time  0935    PT Time Calculation (min)  32 min    Equipment Utilized During Treatment  Gait belt    Activity Tolerance  Patient tolerated treatment well    Behavior During Therapy  [facility] for tasks assessed/performed       Past Medical History:  Diagnosis Date  . Hypertension   . Seizure [facility])     Past Surgical History:  Procedure Laterality Date  . ABDOMINAL HYSTERECTOMY    . BRAIN TUMOR EXCISION      Vitals:   01/21/17 0903  BP: 124/84  Pulse: 66    Subjective Assessment - 01/21/17 0903    Subjective  Pt reports wanting to have BP checked, no falls to report. Pt reports she used heat for pain as suggested and had decrease in pain and increase in ROM.    Pertinent History  previous PT with [facility] for back pain, gait abnormality 2016; HTN, seizure, brain tumor excision with resultant R visual impairments, CVA, R shoulder injury after fall, multiple falls    Limitations  Walking    Patient Stated Goals  To be able to get up from the floor if I fall and improve balance    Currently in Pain?  Yes    Pain Score  5  increases With activity    Pain Location  Other (Comment) Shoulder    Pain Orientation  Right    Pain Descriptors / Indicators  Aching    Pain Onset  1 to 4 weeks ago    Pain Frequency  Intermittent    Aggravating Factors   Flexion, abduction     Pain Relieving Factors  Heat         [facility] PT Assessment - 01/21/17 0917       Berg Balance Test   Sit to Stand  Able to stand without using hands and stabilize independently    Standing Unsupported  Able to stand 2 minutes with supervision    Sitting with Back Unsupported but Feet Supported on Floor or Stool  Able to sit safely and securely 2 minutes    Stand to Sit  Sits safely with minimal use of hands    Transfers  Able to transfer safely, minor use of hands    Standing Unsupported with Eyes Closed  Able to stand 10 seconds with supervision    Standing Ubsupported with Feet Together  Able to place feet together independently but unable to hold for 30 seconds    From Standing, Reach Forward with Outstretched Arm  Can reach forward >5 cm safely (2")    From Standing Position, Pick up Object from Floor  Able to pick up shoe, needs supervision    From Standing Position, Turn to Look Behind Over each Shoulder  Turn sideways only but maintains balance    Turn 360 Degrees  Needs close supervision or verbal  cueing    Standing Unsupported, Alternately Place Feet on Step/Stool  Needs assistance to keep from falling or unable to try    Standing Unsupported, One Foot in [employer] balance while stepping or standing    Standing on One Leg  Unable to try or needs assist to prevent fall    Total Score  32    Berg comment:  32/56        PT Short Term Goals - 01/21/17 0940      PT SHORT TERM GOAL #1   Title  Participate in further assessment of LE strength and balance with five time sit to stand and BERG    Baseline  11/20: 32/56    Time  4    Period  Weeks    Status  On-going    Target Date  01/25/17      PT SHORT TERM GOAL #2   Title  Pt will participate in more indepth vestibular assessment due to dizziness    Time  4    Period  Weeks    Status  On-going    Target Date  01/25/17      PT SHORT TERM GOAL #3   Title  Pt will perform safe bed mobility on flat bed MOD I (including sitting EOB x 3 minutes to allow BP to regulate)    Time  4    Period  Weeks     Status  On-going    Target Date  01/25/17      PT SHORT TERM GOAL #4   Title  Pt will decrease falls risk with gait as indicated by increase in gait velocity to > or = 1.8 ft/sec with rollator    Baseline  1.25 ft/sec with rollator    Time  4    Period  Weeks    Status  On-going    Target Date  01/25/17        PT Long Term Goals - 12/26/16 2159      PT LONG TERM GOAL #1   Title  Will be independent with HEP    Time  8    Period  Weeks    Status  New    Target Date  02/24/17      PT LONG TERM GOAL #2   Title  Increase BERG score by 8 points to decrease falls risk    Baseline  TBD    Time  8    Period  Weeks    Status  New    Target Date  02/24/17      PT LONG TERM GOAL #3   Title  Decrease five time sit to stand by 4 seconds to indicate increased functional LE strength    Baseline  TBD    Time  8    Period  Weeks    Status  New    Target Date  02/24/17      PT LONG TERM GOAL #4   Title  Will increase gait velocity to > or = 2.0 ft/sec with rollator and improved L foot clearance on level surfaces    Baseline  1.25 on eval;     Time  8    Period  Weeks    Status  New    Target Date  02/24/17      PT LONG TERM GOAL #5   Title  Will negotiate 1 step x 4 reps with rollator safely with supervision    Time  8    Period  Weeks    Status  New    Target Date  02/24/17      Additional Long Term Goals   Additional Long Term Goals  Yes      PT LONG TERM GOAL #6   Title  Pt will demonstrate safe floor <> stand transfer with UE support and supervision    Time  8    Period  Weeks    Status  New    Target Date  02/24/17      PT LONG TERM GOAL #7   Title  Pt will improve FOTO by 10 points    Baseline  34 (66% limited)    Time  8    Period  Weeks    Status  New    Target Date  02/24/17            Plan - 01/21/17 0943    Clinical Impression Statement  Pt tolerated treatment well with no limitations to pain or fatigue but some due to weakness, pt felt  "unsteady" at times and needed to have a seat. Todays session focused on establishing baseline for [employer], pt scored 32/56, which is in the high fall risk categeory. Will need to check 5 time sit to stand at next session for baseline to fully meet STG number 1. Pt would benefit from continued PT sessions to progress towards goals.     Rehab Potential  Good    PT Frequency  2x / week    PT Duration  8 weeks    PT Treatment/Interventions  ADLs/Self Care Home Management;DME Instruction;Gait training;Stair training;Functional mobility training;Therapeutic activities;Therapeutic exercise;Balance training;Neuromuscular re-education;Cognitive remediation;Patient/family education;Orthotic Fit/Training;Vestibular    PT Next Visit Plan  Continue with STG's, both baseline and checking progress toward them .Goals are due 01/25/17, however today was only visit number 3 of 17. check HEP and add balance ex's as appropriate:  floor transfer when appropriate    Consulted and Agree with Plan of Care  Patient       Patient will benefit from skilled therapeutic intervention in order to improve the following deficits and impairments:  Abnormal gait, Decreased balance, Decreased cognition, Decreased mobility, Decreased strength, Difficulty walking, Dizziness, Impaired UE functional use, Impaired vision/preception, Postural dysfunction  Visit Diagnosis: Unsteadiness on feet  Muscle weakness (generalized)     Problem List There are no active problems to display for this patient.  Janautica Netzley, SPTA  Dauntae Derusha 01/21/2017, 10:04 AM  [facility] [address] [place], [place], [address] Phone: [phone]   Fax:  [phone]  Name: Deborah Gallegos MRN: [id number] Date of Birth: 04-Mar-1951  This note has been reviewed and edited by supervising CI. Added to clinical impression statement and plan.  Willow Ora, PTA, [facility] [address] [place], [place] [address] [phone] 01/21/17, 4:09 PM

## 2017-01-28 ENCOUNTER — Ambulatory Visit: Payer: Medicare Other | Admitting: Physical Therapy

## 2017-01-28 DIAGNOSIS — M6281 Muscle weakness (generalized): Secondary | ICD-10-CM | POA: Diagnosis not present

## 2017-01-28 DIAGNOSIS — R2681 Unsteadiness on feet: Secondary | ICD-10-CM

## 2017-01-28 DIAGNOSIS — R296 Repeated falls: Secondary | ICD-10-CM

## 2017-01-28 DIAGNOSIS — R42 Dizziness and giddiness: Secondary | ICD-10-CM

## 2017-01-28 DIAGNOSIS — R262 Difficulty in walking, not elsewhere classified: Secondary | ICD-10-CM

## 2017-01-28 NOTE — Patient Instructions (Addendum)

## 2017-01-28 NOTE — Therapy (Signed)
[facility] [address] [place], [place], [address] Phone: [phone]   Fax:  [phone]  Physical Therapy Treatment  Patient Details  Name: Deborah Gallegos MRN: [id number] Date of Birth: 02/24/1951 Referring Provider: Lucianne Lei, MD   Encounter Date: 01/28/2017  PT End of Session - 01/28/17 0935    Visit Number  4    Number of Visits  17    Date for PT Re-Evaluation  02/24/17    Authorization Type  [employer] Medicare $20 copay; G code and PN every 10th visit    PT Start Time  0852    PT Stop Time  0934    PT Time Calculation (min)  42 min    Equipment Utilized During Treatment  Gait belt    Activity Tolerance  Patient tolerated treatment well    Behavior During Therapy  [facility] for tasks assessed/performed       Past Medical History:  Diagnosis Date  . Hypertension   . Seizure [facility])     Past Surgical History:  Procedure Laterality Date  . ABDOMINAL HYSTERECTOMY    . BRAIN TUMOR EXCISION      There were no vitals filed for this visit.  Subjective Assessment - 01/28/17 0936    Subjective  Pt reports having fallen this morning by tripping over an upturned table leg. Pt was not using her rollator at the time, but she states she pushed herself up from the floor and she is ok. Pt's right shoulder has been hurting for a couple weeks, but she states it is feeling a little better and only hurts when she puts pressure on it.    Pertinent History  previous PT with Vinnie Level for back pain, gait abnormality 2016; HTN, seizure, brain tumor excision with resultant R visual impairments, CVA, R shoulder injury after fall, multiple falls    Limitations  Walking    Patient Stated Goals  To be able to get up from the floor if I fall and improve balance    Currently in Pain?  No/denies         [facility] PT Assessment - 01/28/17 0001      Functional Gait  Assessment   Gait assessed   Yes        [facility] Adult PT Treatment/Exercise  - 01/28/17 0001      Bed Mobility   Bed Mobility  Sit to Supine Performed x2. Pt showing improvement.    Supine to Sit  5: Supervision    Supine to Sit Details (indicate cue type and reason)  VC's to bend RLE and cross RUE over body to pull up to EOB.    Sit to Supine  5: Supervision cues to go to left sidelying position from sitting    Sit to Sidelying Left  --      Transfers   Transfers  Sit to Stand x5 test    Sit to Stand  5: Supervision    Sit to Stand Details  Verbal cues for technique    Sit to Stand Details (indicate cue type and reason)  19.22 sec with no use of UE    Comments  Sit to stand score 19.22 sec with no UE support from chair with handrails (on 10/27). 16.56 secs from mat with no UE support on 11/15.  Ambulation/Gait   Ambulation/Gait  Yes    Ambulation/Gait Assistance  5: Supervision    Ambulation/Gait Assistance Details  Adjusted Rollator to lowered height to improve UE form. Encourage pt to walk closer to Rollator.    Ambulation Distance (Feet)  100 Feet    Assistive device  Rollator    Gait Pattern  Step-to pattern;Step-through pattern;Decreased step length - left;Decreased stride length;Decreased hip/knee flexion - left;Decreased dorsiflexion - left;Trunk flexed;Poor foot clearance - left    Stairs  --    Curb  --    Gait Comments  --      Self-Care   Self-Care  Other Self-Care Comments    Other Self-Care Comments   Discussed orthostatic hypotension and the need to sit at EOB for 3 min. before standing. Discussed sitting if feeling dizzy or light-headed to avoid falling. Educated on fall prevention and discussed the importance of preventing falls.       Neuro Re-ed    Neuro Re-ed Details   Pt performed balance activities in // bars for safety. Performed wide and narrow BOS with EO and head turns right/left and up/down. VC's and Min guard/Min Assist provided for safety and balance correction.           Exercises   Exercises  --      Knee/Hip Exercises: Supine   Constance Haw  --        PT Education - 01/28/17 [phone]    Education provided  Yes    Education Details  Educated on falls prevention, with emphasis on night lights, proper shoes, keeping AD close, avoiding clutter.    Person(s) Educated  Patient    Methods  Explanation;Handout    Comprehension  Verbalized understanding       PT Short Term Goals - 01/28/17 1306      PT SHORT TERM GOAL #1   Title  Participate in further assessment of LE strength and balance with five time sit to stand and BERG    Baseline  BERG 11/20: 32/56   Sit/stand baseline 19.22 sec in chair without using handrails    Time  4    Period  Weeks    Status  Achieved      PT SHORT TERM GOAL #2   Title  Pt will participate in more indepth vestibular assessment due to dizziness    Time  4    Period  Weeks    Status  On-going      PT SHORT TERM GOAL #3   Title  Pt will perform safe bed mobility on flat bed MOD I (including sitting EOB x 3 minutes to allow BP to regulate)    Time  4    Period  Weeks    Status  Achieved      PT SHORT TERM GOAL #4   Title  Pt will decrease falls risk with gait as indicated by increase in gait velocity to > or = 1.8 ft/sec with rollator    Baseline  1.25 ft/sec with rollator;   1.95 ft/sec with rollator on 11/27    Time  4    Period  Weeks    Status  Achieved        PT Long Term Goals - 12/26/16 2159      PT LONG TERM GOAL #1   Title  Will be independent with HEP    Time  8    Period  Weeks    Status  New    Target Date  02/24/17      PT LONG TERM GOAL #2   Title  Increase BERG score by 8 points to decrease falls risk    Baseline  TBD    Time  8    Period  Weeks    Status  New    Target Date  02/24/17      PT LONG TERM GOAL #3   Title  Decrease five time sit to stand by 4 seconds to indicate increased functional LE strength    Baseline  TBD    Time  8    Period  Weeks    Status  New    Target  Date  02/24/17      PT LONG TERM GOAL #4   Title  Will increase gait velocity to > or = 2.0 ft/sec with rollator and improved L foot clearance on level surfaces    Baseline  1.25 on eval;     Time  8    Period  Weeks    Status  New    Target Date  02/24/17      PT LONG TERM GOAL #5   Title  Will negotiate 1 step x 4 reps with rollator safely with supervision    Time  8    Period  Weeks    Status  New    Target Date  02/24/17      Additional Long Term Goals   Additional Long Term Goals  Yes      PT LONG TERM GOAL #6   Title  Pt will demonstrate safe floor <> stand transfer with UE support and supervision    Time  8    Period  Weeks    Status  New    Target Date  02/24/17      PT LONG TERM GOAL #7   Title  Pt will improve FOTO by 10 points    Baseline  34 (66% limited)    Time  8    Period  Weeks    Status  New    Target Date  02/24/17        Plan - 01/28/17 8101    Clinical Impression Statement  Session focused on STG's (5x sit/stand and gait velocity), falls prevention education, and bed mobility. Pt returns in two days, and we set a goal to try not to have a fall before then. Pt tolerated treatment well, and will benefit from further PT sessions to progress toward goals.     Rehab Potential  Good    PT Frequency  2x / week    PT Duration  8 weeks    PT Treatment/Interventions  ADLs/Self Care Home Management;DME Instruction;Gait training;Stair training;Functional mobility training;Therapeutic activities;Therapeutic exercise;Balance training;Neuromuscular re-education;Cognitive remediation;Patient/family education;Orthotic Fit/Training;Vestibular    PT Next Visit Plan  Review HEP; add balance exercise such as EO, wide/narrow BOS with head turns; weight shifting exercises; floor transfer when shoulder feels better.    Consulted and Agree with Plan of Care  Patient       Patient will benefit from skilled therapeutic intervention in order to improve the following deficits  and impairments:  Abnormal gait, Decreased balance, Decreased cognition, Decreased mobility, Decreased strength, Difficulty walking, Dizziness, Impaired UE functional use, Impaired vision/preception, Postural dysfunction  Visit Diagnosis: Unsteadiness on feet  Muscle weakness (generalized)  Repeated falls  Dizziness and giddiness  Difficulty in walking, not elsewhere classified     Problem List There are no active problems to display for this patient.  Andria Meuse, [place] 01/28/2017, 4:26 PM  [facility] [address] [place], [place], [address] Phone: [phone]   Fax:  [phone]  Name: ERCIE ELIASEN MRN: [id number] Date of Birth: [birth year]

## 2017-01-30 ENCOUNTER — Ambulatory Visit: Payer: Medicare Other | Admitting: Physical Therapy

## 2017-01-30 DIAGNOSIS — R2681 Unsteadiness on feet: Secondary | ICD-10-CM

## 2017-01-30 DIAGNOSIS — M6281 Muscle weakness (generalized): Secondary | ICD-10-CM | POA: Diagnosis not present

## 2017-01-31 ENCOUNTER — Encounter: Payer: Self-pay | Admitting: Physical Therapy

## 2017-01-31 NOTE — Therapy (Signed)
[facility] [address] [place], [place], [address] Phone: [phone]   Fax:  [phone]  Physical Therapy Treatment  Patient Details  Name: Deborah Gallegos MRN: [id number] Date of Birth: [birth year] Referring Provider: Lucianne Lei, MD   Encounter Date: 01/30/2017  PT End of Session - 01/31/17 1509    Visit Number  5    Number of Visits  17    Date for PT Re-Evaluation  02/24/17    Authorization Type  [employer] Medicare $20 copay; G code and PN every 10th visit    PT Start Time  0857 pt arrived 40" late for 8:45 appt    PT Stop Time  0932    PT Time Calculation (min)  35 min       Past Medical History:  Diagnosis Date  . Hypertension   . Seizure [facility])     Past Surgical History:  Procedure Laterality Date  . ABDOMINAL HYSTERECTOMY    . BRAIN TUMOR EXCISION      There were no vitals filed for this visit.  Subjective Assessment - 01/31/17 1504    Subjective  Pt states she has had 2 falls since Tues.- did not get hurt    Pertinent History  previous PT with Vinnie Level for back pain, gait abnormality 2016; HTN, seizure, brain tumor excision with resultant R visual impairments, CVA, R shoulder injury after fall, multiple falls    Patient Stated Goals  To be able to get up from the floor if I fall and improve balance    Currently in Pain?  No/denies                      [facility] Adult PT Treatment/Exercise - 01/31/17 0001      Transfers   Transfers  Sit to Stand x5 test    Sit to Stand  5: Supervision      Ambulation/Gait   Ambulation/Gait  Yes    Ambulation/Gait Assistance  5: Supervision    Ambulation/Gait Assistance Details  cues to stand erect and stay close to RW    Ambulation Distance (Feet)  115 Feet    Assistive device  Rollator    Gait Pattern  Decreased arm swing - right      Knee/Hip Exercises: Standing   Heel Raises  Both;1 set;10 reps          Balance Exercises - 01/31/17 1507       Balance Exercises: Standing   Other Standing Exercises  Pt performed forward, back and side kicks at counter x 10 reps each with UE support;  marching in place x 10 reps; touching balance bubbles with each foot with UE support with min assist with LOB:  pt performed stepping over/back of lower orange hurdle 5 reps each foot;  alternate tap ups to 6" steps with cues to lift foot high for increased clearance;          PT Short Term Goals - 01/28/17 1306      PT SHORT TERM GOAL #1   Title  Participate in further assessment of LE strength and balance with five time sit to stand and BERG    Baseline  BERG 11/20: 32/56   Sit/stand baseline 19.22 sec in chair without using handrails    Time  4    Period  Weeks    Status  Achieved      PT SHORT TERM GOAL #2   Title  Pt will participate  in more indepth vestibular assessment due to dizziness    Time  4    Period  Weeks    Status  On-going      PT SHORT TERM GOAL #3   Title  Pt will perform safe bed mobility on flat bed MOD I (including sitting EOB x 3 minutes to allow BP to regulate)    Time  4    Period  Weeks    Status  Achieved      PT SHORT TERM GOAL #4   Title  Pt will decrease falls risk with gait as indicated by increase in gait velocity to > or = 1.8 ft/sec with rollator    Baseline  1.25 ft/sec with rollator;   1.95 ft/sec with rollator on 11/27    Time  4    Period  Weeks    Status  Achieved        PT Long Term Goals - 12/26/16 2159      PT LONG TERM GOAL #1   Title  Will be independent with HEP    Time  8    Period  Weeks    Status  New    Target Date  02/24/17      PT LONG TERM GOAL #2   Title  Increase BERG score by 8 points to decrease falls risk    Baseline  TBD    Time  8    Period  Weeks    Status  New    Target Date  02/24/17      PT LONG TERM GOAL #3   Title  Decrease five time sit to stand by 4 seconds to indicate increased functional LE strength    Baseline  TBD    Time  8    Period  Weeks     Status  New    Target Date  02/24/17      PT LONG TERM GOAL #4   Title  Will increase gait velocity to > or = 2.0 ft/sec with rollator and improved L foot clearance on level surfaces    Baseline  1.25 on eval;     Time  8    Period  Weeks    Status  New    Target Date  02/24/17      PT LONG TERM GOAL #5   Title  Will negotiate 1 step x 4 reps with rollator safely with supervision    Time  8    Period  Weeks    Status  New    Target Date  02/24/17      Additional Long Term Goals   Additional Long Term Goals  Yes      PT LONG TERM GOAL #6   Title  Pt will demonstrate safe floor <> stand transfer with UE support and supervision    Time  8    Period  Weeks    Status  New    Target Date  02/24/17      PT LONG TERM GOAL #7   Title  Pt will improve FOTO by 10 points    Baseline  34 (66% limited)    Time  8    Period  Weeks    Status  New    Target Date  02/24/17            Plan - 01/31/17 1511    Clinical Impression Statement  Pt has difficulty lifting each leg high enough to clear step/hurdle  due to decr. hip flexor strength; pt reports having had 2 falls within past 2 days (since Tuesday after previous PT session) - unclear as to why pt continues to have falls    Rehab Potential  Good    PT Frequency  2x / week    PT Duration  8 weeks    PT Treatment/Interventions  ADLs/Self Care Home Management;DME Instruction;Gait training;Stair training;Functional mobility training;Therapeutic activities;Therapeutic exercise;Balance training;Neuromuscular re-education;Cognitive remediation;Patient/family education;Orthotic Fit/Training;Vestibular    PT Next Visit Plan  cont balance and gait training    Consulted and Agree with Plan of Care  Patient       Patient will benefit from skilled therapeutic intervention in order to improve the following deficits and impairments:  Abnormal gait, Decreased balance, Decreased cognition, Decreased mobility, Decreased strength, Difficulty  walking, Dizziness, Impaired UE functional use, Impaired vision/preception, Postural dysfunction  Visit Diagnosis: Muscle weakness (generalized)  Unsteadiness on feet     Problem List There are no active problems to display for this patient.   EHMCNO, BSJGG EZMOQHU, PT 01/31/2017, 3:20 PM  [facility] [address] [place], [place], [address] Phone: [phone]   Fax:  [phone]  Name: CLYDIE DILLEN MRN: [id number] Date of Birth: 1950/09/19

## 2017-02-04 ENCOUNTER — Ambulatory Visit: Payer: Medicare Other | Admitting: Physical Therapy

## 2017-02-06 ENCOUNTER — Ambulatory Visit: Payer: Medicare Other | Admitting: Physical Therapy

## 2017-02-11 ENCOUNTER — Ambulatory Visit: Payer: Medicare Other | Admitting: Physical Therapy

## 2017-02-13 ENCOUNTER — Ambulatory Visit: Payer: Medicare Other | Attending: Family Medicine | Admitting: Physical Therapy

## 2017-02-13 DIAGNOSIS — R2681 Unsteadiness on feet: Secondary | ICD-10-CM | POA: Insufficient documentation

## 2017-02-13 DIAGNOSIS — M6281 Muscle weakness (generalized): Secondary | ICD-10-CM | POA: Insufficient documentation

## 2017-02-14 ENCOUNTER — Other Ambulatory Visit: Payer: Self-pay | Admitting: Orthopedic Surgery

## 2017-02-14 DIAGNOSIS — M25511 Pain in right shoulder: Secondary | ICD-10-CM

## 2017-02-18 ENCOUNTER — Ambulatory Visit: Payer: Medicare Other | Admitting: Physical Therapy

## 2017-02-18 DIAGNOSIS — R2681 Unsteadiness on feet: Secondary | ICD-10-CM

## 2017-02-18 DIAGNOSIS — M6281 Muscle weakness (generalized): Secondary | ICD-10-CM

## 2017-02-18 NOTE — Patient Instructions (Signed)
ROM: Pendulum (Circular)    Let right arm move in circle clockwise, then counterclockwise, by rocking body weight in circular pattern. Circle _10___ times each direction per set. Do _1___ sets per session. Do __2__ sessions per day.  ALSO DO COUNTER CLOCKWISE - 10 reps  Copyright  VHI. All rights reserved.  Pendulum Side to Side    Bend forward 90 at waist, leaning on table for support. Rock body from side to side and let arm swing freely. Repeat __10__ times. Do __1__ sessions per day  .Lateral Extension With Wand    With wand held in front, reach as far as possible to one side. Repeat to other side. Hold _2___ seconds each position. Repeat __10__ times. Do ___2_ sessions per day.  Copyright  VHI. All rights reserved.  Flexion Lift With Wand    Hold wand with palms up. Lift over head to a pain free range. Hold _2___ seconds. Repeat _10___ times. Do _1-2___ sessions per day.  ALSO DO - CANE BEHIND BACK  - PULL UP TOWARD SHOULDER BLADES - 10 reps   Copyright  VHI. All rights reserved.

## 2017-02-19 ENCOUNTER — Encounter: Payer: Self-pay | Admitting: Physical Therapy

## 2017-02-19 NOTE — Therapy (Addendum)
[facility] [address] [place], [place], [address] Phone: [phone]   Fax:  [phone]  Physical Therapy Treatment  Patient Details  Name: Deborah Gallegos MRN: [id number] Date of Birth: 03-19-1950 Referring Provider: Lucianne Lei, MD   Encounter Date: 02/18/2017  PT End of Session - 02/19/17 1709    Visit Number  6    Number of Visits  17    Date for PT Re-Evaluation  02/24/17    Authorization Type  [employer] Medicare $20 copay; G code and PN every 10th visit    PT Start Time  0850    PT Stop Time  0933    PT Time Calculation (min)  43 min       Past Medical History:  Diagnosis Date  . Hypertension   . Seizure [facility])     Past Surgical History:  Procedure Laterality Date  . ABDOMINAL HYSTERECTOMY    . BRAIN TUMOR EXCISION      There were no vitals filed for this visit.  Subjective Assessment - 02/19/17 1707    Subjective  Pt has new script for Rt shoulder treatment by Dr. French [place] - possible Rt RTC - gentle ROM and strengthening; pt states MRI is scheduled later this week    Pertinent History  previous PT with Vinnie Level for back pain, gait abnormality 2016; HTN, seizure, brain tumor excision with resultant R visual impairments, CVA, R shoulder injury after fall, multiple falls    Patient Stated Goals  To be able to get up from the floor if I fall and improve balance                      [facility] Adult PT Treatment/Exercise - 02/19/17 0001      Transfers   Transfers  Sit to Stand x5 test    Sit to Stand  5: Supervision      Knee/Hip Exercises: Standing   Heel Raises  Both;1 set;10 reps      Pt performed pendulum exercises for Rt shoulder - forward/back and laterally; performed wand exercises for Rt shoulder Including flexion, abduction and internal rotation per new script from Dr. French [place] for possible Rt RCT   Neuro Re-ed:  Pt performed standing forward, back and side kicks at counter 10 reps  each leg; marching in place x 10 reps with  UE support on counter prn with CGA    PT Education - 02/19/17 1709    Education provided  Yes    Education Details  pendulum exs for Rt shoulder added on 02-18-17    Person(s) Educated  Patient    Methods  Explanation;Demonstration;Handout    Comprehension  Verbalized understanding;Returned demonstration       PT Short Term Goals - 01/28/17 1306      PT SHORT TERM GOAL #1   Title  Participate in further assessment of LE strength and balance with five time sit to stand and BERG    Baseline  BERG 11/20: 32/56   Sit/stand baseline 19.22 sec in chair without using handrails    Time  4    Period  Weeks    Status  Achieved      PT SHORT TERM GOAL #2   Title  Pt will participate in more indepth vestibular assessment due to dizziness    Time  4    Period  Weeks    Status  On-going      PT SHORT TERM GOAL #3  Title  Pt will perform safe bed mobility on flat bed MOD I (including sitting EOB x 3 minutes to allow BP to regulate)    Time  4    Period  Weeks    Status  Achieved      PT SHORT TERM GOAL #4   Title  Pt will decrease falls risk with gait as indicated by increase in gait velocity to > or = 1.8 ft/sec with rollator    Baseline  1.25 ft/sec with rollator;   1.95 ft/sec with rollator on 11/27    Time  4    Period  Weeks    Status  Achieved        PT Long Term Goals - 12/26/16 2159      PT LONG TERM GOAL #1   Title  Will be independent with HEP    Time  8    Period  Weeks    Status  New    Target Date  02/24/17      PT LONG TERM GOAL #2   Title  Increase BERG score by 8 points to decrease falls risk    Baseline  TBD    Time  8    Period  Weeks    Status  New    Target Date  02/24/17      PT LONG TERM GOAL #3   Title  Decrease five time sit to stand by 4 seconds to indicate increased functional LE strength    Baseline  TBD    Time  8    Period  Weeks    Status  New    Target Date  02/24/17      PT LONG TERM  GOAL #4   Title  Will increase gait velocity to > or = 2.0 ft/sec with rollator and improved L foot clearance on level surfaces    Baseline  1.25 on eval;     Time  8    Period  Weeks    Status  New    Target Date  02/24/17      PT LONG TERM GOAL #5   Title  Will negotiate 1 step x 4 reps with rollator safely with supervision    Time  8    Period  Weeks    Status  New    Target Date  02/24/17      Additional Long Term Goals   Additional Long Term Goals  Yes      PT LONG TERM GOAL #6   Title  Pt will demonstrate safe floor <> stand transfer with UE support and supervision    Time  8    Period  Weeks    Status  New    Target Date  02/24/17      PT LONG TERM GOAL #7   Title  Pt will improve FOTO by 10 points    Baseline  34 (66% limited)    Time  8    Period  Weeks    Status  New    Target Date  02/24/17            Plan - 02/19/17 1710    Clinical Impression Statement  Pt able to perform pendulum exs and gentle AAROM (wand exs) with min. c/o pain in Rt shoulder; strengthening exs on hold pending results of MRI    Rehab Potential  Good    PT Frequency  2x / week    PT Duration  8  weeks    PT Treatment/Interventions  ADLs/Self Care Home Management;DME Instruction;Gait training;Stair training;Functional mobility training;Therapeutic activities;Therapeutic exercise;Balance training;Neuromuscular re-education;Cognitive remediation;Patient/family education;Orthotic Fit/Training;Vestibular    PT Next Visit Plan  cont balance and gait training    Consulted and Agree with Plan of Care  Patient       Patient will benefit from skilled therapeutic intervention in order to improve the following deficits and impairments:  Abnormal gait, Decreased balance, Decreased cognition, Decreased mobility, Decreased strength, Difficulty walking, Dizziness, Impaired UE functional use, Impaired vision/preception, Postural dysfunction  Visit Diagnosis: Muscle weakness  (generalized)  Unsteadiness on feet     Problem List There are no active problems to display for this patient.   Keshayla, Schrum, PT 02/19/2017, 5:12 PM  [facility] [address] [place], [place], [address] Phone: [phone]   Fax:  [phone]  Name: MAKALEIGH REINARD MRN: [id number] Date of Birth: June 22, 1950

## 2017-02-20 ENCOUNTER — Ambulatory Visit: Payer: Medicare Other | Admitting: Physical Therapy

## 2017-02-23 ENCOUNTER — Other Ambulatory Visit: Payer: Medicare Other

## 2017-02-27 ENCOUNTER — Ambulatory Visit: Payer: Medicare Other | Admitting: Physical Therapy

## 2017-03-06 ENCOUNTER — Ambulatory Visit: Payer: Medicare Other | Admitting: Physical Therapy

## 2017-03-07 ENCOUNTER — Inpatient Hospital Stay
Admission: RE | Admit: 2017-03-07 | Discharge: 2017-03-07 | Disposition: A | Discharge status: Home / Self Care | Payer: Medicare Other | Source: Ambulatory Visit | Attending: Orthopedic Surgery | Admitting: Orthopedic Surgery

## 2017-03-17 ENCOUNTER — Encounter: Payer: Self-pay | Admitting: Psychology

## 2017-03-18 ENCOUNTER — Ambulatory Visit
Admission: RE | Admit: 2017-03-18 | Discharge: 2017-03-18 | Disposition: A | Discharge status: Home / Self Care | Payer: Medicare Other | Source: Ambulatory Visit | Attending: Orthopedic Surgery | Admitting: Orthopedic Surgery

## 2017-03-18 DIAGNOSIS — M25511 Pain in right shoulder: Secondary | ICD-10-CM

## 2017-03-24 ENCOUNTER — Encounter: Payer: Medicare Other | Attending: Psychology | Admitting: Psychology

## 2017-03-24 DIAGNOSIS — I10 Essential (primary) hypertension: Secondary | ICD-10-CM | POA: Insufficient documentation

## 2017-03-24 DIAGNOSIS — R41844 Frontal lobe and executive function deficit: Secondary | ICD-10-CM | POA: Diagnosis not present

## 2017-03-24 DIAGNOSIS — R413 Other amnesia: Secondary | ICD-10-CM

## 2017-03-24 DIAGNOSIS — R569 Unspecified convulsions: Secondary | ICD-10-CM | POA: Diagnosis not present

## 2017-03-31 ENCOUNTER — Ambulatory Visit: Payer: Medicare Other | Attending: Family Medicine | Admitting: Physical Therapy

## 2017-03-31 DIAGNOSIS — R29898 Other symptoms and signs involving the musculoskeletal system: Secondary | ICD-10-CM | POA: Diagnosis present

## 2017-03-31 DIAGNOSIS — R2689 Other abnormalities of gait and mobility: Secondary | ICD-10-CM | POA: Diagnosis present

## 2017-03-31 DIAGNOSIS — R2681 Unsteadiness on feet: Secondary | ICD-10-CM

## 2017-03-31 DIAGNOSIS — M6281 Muscle weakness (generalized): Secondary | ICD-10-CM | POA: Insufficient documentation

## 2017-04-01 ENCOUNTER — Encounter: Payer: Self-pay | Admitting: Physical Therapy

## 2017-04-01 NOTE — Therapy (Signed)
[facility] [address] [place], [place], [address] Phone: [phone]   Fax:  [phone]  Physical Therapy Treatment  Patient Details  Name: Deborah Gallegos MRN: [id number] Date of Birth: 05-17-1950 Referring Provider: Lucianne Lei, MD   Encounter Date: 03/31/2017  PT End of Session - 04/01/17 0918    Visit Number  7    Number of Visits  15    Date for PT Re-Evaluation  05/09/17    Authorization Type  [employer] Medicare $20 copay; G code and PN every 10th visit    PT Start Time  0849    PT Stop Time  0933    PT Time Calculation (min)  44 min       Past Medical History:  Diagnosis Date  . Hypertension   . Seizure [facility])     Past Surgical History:  Procedure Laterality Date  . ABDOMINAL HYSTERECTOMY    . BRAIN TUMOR EXCISION      There were no vitals filed for this visit.  Subjective Assessment - 04/01/17 0858    Subjective  Pt reports MRI was done and did show that she has Rt rotator cuff tear; states she is planning on having surgery sometime in future - says her orthopedic doctor is consulting with her other doctors Pt has script from Dr. French Ana which states PT:  RC tear R (injected 02-06-17); gentle ROM & strengthening Rt shoulder     Pertinent History  previous PT with [facility] for back pain, gait abnormality 2016; HTN, seizure, brain tumor excision with resultant R visual impairments, CVA, R shoulder injury after fall, multiple falls    Limitations  Walking    Patient Stated Goals  To be able to get up from the floor if I fall and improve balance    Currently in Pain?  Yes    Pain Score  3  pain increases with use and with pulling motion    Pain Location  Shoulder    Pain Orientation  Right    Pain Descriptors / Indicators  Aching    Pain Type  Chronic pain    Pain Onset  More than a month ago    Pain Frequency  Intermittent                      [facility] Adult PT Treatment/Exercise - 04/01/17  0001      Transfers   Transfers  Sit to Stand    Sit to Stand  Without upper extremity assist    Number of Reps  10 reps      Ambulation/Gait   Ambulation/Gait  Yes    Ambulation/Gait Assistance  5: Supervision    Ambulation/Gait Assistance Details  rollator height raised 1 notch to facilitate more erect standing posture    Ambulation Distance (Feet)  115 Feet    Assistive device  Rollator    Ambulation Surface  Level;Indoor      Exercises   Exercises  Shoulder      Knee/Hip Exercises: Aerobic   Recumbent Bike  SciFit level 1.0 - 1.5 with LE's only due to c/o Rt shoulder pain      Shoulder Exercises: Seated   Other Seated Exercises  Pt performed AAROM exs with use of wand (White PVC pole) for shoulder flexion, extension and horiozntal adduction/abduction 10 reps each       Shoulder Exercises: ROM/Strengthening   Wall Pushups  10 reps    Pendulum  10  reps clockwise, counterclockwise and diagonal patterns           Balance Exercises - 04/01/17 0916      Balance Exercises: Standing   Other Standing Exercises  standing alternating hip flexion/extension and abduction x 10 reps each direction; marching in place x 10 reps ; alternate tap ups to 6" step 10 reps each           PT Short Term Goals - 04/01/17 6160      PT SHORT TERM GOAL #1   Title  Participate in further assessment of LE strength and balance with five time sit to stand and BERG    Baseline  BERG 11/20: 32/56   Sit/stand baseline 19.22 sec in chair without using handrails    Status  Achieved      PT SHORT TERM GOAL #2   Title  Pt will participate in more indepth vestibular assessment due to dizziness    Status  Deferred      PT SHORT TERM GOAL #3   Title  Pt will perform safe bed mobility on flat bed MOD I (including sitting EOB x 3 minutes to allow BP to regulate)    Status  Achieved      PT SHORT TERM GOAL #4   Title  Pt will decrease falls risk with gait as indicated by increase in gait velocity to  > or = 1.8 ft/sec with rollator    Status  Achieved        PT Long Term Goals - 04/01/17 7371      PT LONG TERM GOAL #1   Title  Will be independent with HEP - including exercises for Rt shoulder AAROM    Time  4    Period  Weeks    Status  New    Target Date  05/09/17      PT LONG TERM GOAL #2   Title  Increase BERG score by 8 points to decrease falls risk    Time  4    Period  Weeks    Status  New    Target Date  05/09/17      PT LONG TERM GOAL #3   Title  Decrease five time sit to stand by 4 seconds to indicate increased functional LE strength    Time  4    Period  Weeks    Status  New    Target Date  05/09/17      PT LONG TERM GOAL #4   Title  Will increase gait velocity to > or = 2.0 ft/sec with rollator and improved L foot clearance on level surfaces    Baseline  1.25 on eval;     Time  4    Period  Weeks    Status  New    Target Date  05/09/17      PT LONG TERM GOAL #5   Title  Will negotiate 1 step x 4 reps with rollator safely with supervision    Time  4    Period  Weeks    Status  New    Target Date  05/09/17      PT LONG TERM GOAL #6   Title  Pt will demonstrate safe floor <> stand transfer with UE support and supervision    Baseline  Deferred due to recent diagnosis of Rt rotator cuff tear    Status  Deferred      PT LONG TERM GOAL #7   Title  Pt will improve FOTO by 10 points    Status  Deferred            Plan - 04/01/17 0919    Clinical Impression Statement  Pt reports min pain in Rt shoulder at end ROM with AAROM wand exercises with pt performing approx. 3/4 range prior to onset of pain;  pt tolerated exercises well.  Posture improved with rollator handles raised to facilitate more upright posture.      Rehab Potential  Good    PT Frequency  2x / week    PT Duration  4 weeks renewal completed 03-31-17 for 4 weeks    PT Treatment/Interventions  ADLs/Self Care Home Management;DME Instruction;Gait training;Stair training;Functional mobility  training;Therapeutic activities;Therapeutic exercise;Balance training;Neuromuscular re-education;Cognitive remediation;Patient/family education;Orthotic Fit/Training;Vestibular    PT Next Visit Plan  cont balance and gait training - give pics for Rt shoulder AAROM    Consulted and Agree with Plan of Care  Patient       Patient will benefit from skilled therapeutic intervention in order to improve the following deficits and impairments:  Abnormal gait, Decreased balance, Decreased cognition, Decreased mobility, Decreased strength, Difficulty walking, Dizziness, Impaired UE functional use, Impaired vision/preception, Postural dysfunction  Visit Diagnosis: Muscle weakness (generalized) - Plan: PT plan of care cert/re-cert  Unsteadiness on feet - Plan: PT plan of care cert/re-cert  Other symptoms and signs involving the musculoskeletal system - Plan: PT plan of care cert/re-cert  Other abnormalities of gait and mobility - Plan: PT plan of care cert/re-cert     Problem List There are no active problems to display for this patient.   Juan, Olthoff, PT 04/01/2017, 4:49 PM  [facility] [address] [place], [place], [address] Phone: [phone]   Fax:  [phone]  Name: TRULY STANKIEWICZ MRN: [id number] Date of Birth: [birth year]

## 2017-04-10 ENCOUNTER — Encounter: Payer: Medicare Other | Attending: Psychology | Admitting: Psychology

## 2017-04-10 ENCOUNTER — Encounter: Payer: Self-pay | Admitting: Psychology

## 2017-04-10 DIAGNOSIS — I10 Essential (primary) hypertension: Secondary | ICD-10-CM | POA: Insufficient documentation

## 2017-04-10 DIAGNOSIS — R569 Unspecified convulsions: Secondary | ICD-10-CM | POA: Diagnosis not present

## 2017-04-10 DIAGNOSIS — R413 Other amnesia: Secondary | ICD-10-CM

## 2017-04-10 NOTE — Progress Notes (Signed)
Today we administered a battery of neuropsychological tests that included the RBANDS and portions of the Wechsler Adult Intelligence Scale and Wechsler Memory Scale's.  The administration time was 2 hours and was performed by myself at a PhD level.  Today was administration only.  On 04/17/2017 at the results will be interpreted and feedback will be provided to the patient on 04/24/2017.

## 2017-04-14 ENCOUNTER — Ambulatory Visit: Payer: Medicare Other | Admitting: Physical Therapy

## 2017-04-15 ENCOUNTER — Ambulatory Visit: Payer: Medicare Other | Attending: Family Medicine | Admitting: Physical Therapy

## 2017-04-15 DIAGNOSIS — R2681 Unsteadiness on feet: Secondary | ICD-10-CM

## 2017-04-15 DIAGNOSIS — M6281 Muscle weakness (generalized): Secondary | ICD-10-CM

## 2017-04-16 ENCOUNTER — Encounter: Payer: Self-pay | Admitting: Physical Therapy

## 2017-04-16 NOTE — Therapy (Signed)
[facility] [address] [place], [place], [address] Phone: [phone]   Fax:  [phone]  Physical Therapy Treatment  Patient Details  Name: Deborah Gallegos MRN: [id number] Date of Birth: 27-Feb-1951 Referring Provider: Lucianne Lei, MD   Encounter Date: 04/15/2017  PT End of Session - 04/16/17 1904    Visit Number  8    Number of Visits  15    Date for PT Re-Evaluation  05/09/17    Authorization Type  --    PT Start Time  0847    PT Stop Time  0931    PT Time Calculation (min)  44 min       Past Medical History:  Diagnosis Date  . Hypertension   . Seizure [facility])     Past Surgical History:  Procedure Laterality Date  . ABDOMINAL HYSTERECTOMY    . BRAIN TUMOR EXCISION      There were no vitals filed for this visit.  Subjective Assessment - 04/16/17 1854    Subjective  Pt states she was doing good until she had 2 falls on Monday - did not get hurt - had a scarf and it fell without her realizing it and she stepped on it and slipped    Limitations  Walking    Patient Stated Goals  To be able to get up from the floor if I fall and improve balance    Currently in Pain?  Yes    Pain Score  2     Pain Location  Shoulder    Pain Orientation  Right    Pain Descriptors / Indicators  Aching    Pain Type  Chronic pain    Pain Onset  More than a month ago                      [facility] Adult PT Treatment/Exercise - 04/16/17 0001      Transfers   Transfers  Sit to Stand    Sit to Stand  Without upper extremity assist    Number of Reps  Other reps (comment) 5 reps      Ambulation/Gait   Ambulation/Gait  Yes    Ambulation/Gait Assistance  5: Supervision    Ambulation Distance (Feet)  115 Feet    Assistive device  Rollator    Ambulation Surface  Level;Indoor      Knee/Hip Exercises: Aerobic   Recumbent Bike  SciFit level 1.5 x 5" with LE's only       Knee/Hip Exercises: Standing   Heel Raises   Both;1 set;10 reps      Shoulder Exercises: Seated   Extension  Strengthening;Right;10 reps;Weights 3#    Flexion  Strengthening;Right;10 reps 3#    Other Seated Exercises  Pt performed AAROM exs with use of wand (White PVC pole) for shoulder flexion, extension and horiozntal adduction/abduction 10 reps each           Balance Exercises - 04/16/17 1903      Balance Exercises: Standing   Standing Eyes Opened  Wide (BOA);1 rep;10 secs    Rockerboard  Anterior/posterior;EO;10 reps    Sidestepping  2 reps inside // bars     Other Standing Exercises  standing alternating hip flexion/extension and abduction x 10 reps each direction; marching in place x 10 reps ; alternate tap ups to 6" step 10 reps each           PT Short Term Goals - 04/01/17 1324  PT SHORT TERM GOAL #1   Title  Participate in further assessment of LE strength and balance with five time sit to stand and BERG    Baseline  BERG 11/20: 32/56   Sit/stand baseline 19.22 sec in chair without using handrails    Status  Achieved      PT SHORT TERM GOAL #2   Title  Pt will participate in more indepth vestibular assessment due to dizziness    Status  Deferred      PT SHORT TERM GOAL #3   Title  Pt will perform safe bed mobility on flat bed MOD I (including sitting EOB x 3 minutes to allow BP to regulate)    Status  Achieved      PT SHORT TERM GOAL #4   Title  Pt will decrease falls risk with gait as indicated by increase in gait velocity to > or = 1.8 ft/sec with rollator    Status  Achieved        PT Long Term Goals - 04/15/17 0902      PT LONG TERM GOAL #3   Baseline  17.00 secs on 04-15-17  (Pended)             Plan - 04/16/17 1955    Clinical Impression Statement  Pt's balance not as good today as in previous session with pt requiring use of // bar for assistance to recover LOB    Rehab Potential  Good    PT Frequency  2x / week    PT Duration  4 weeks    PT Treatment/Interventions  ADLs/Self  Care Home Management;DME Instruction;Gait training;Stair training;Functional mobility training;Therapeutic activities;Therapeutic exercise;Balance training;Neuromuscular re-education;Cognitive remediation;Patient/family education;Orthotic Fit/Training;Vestibular    PT Next Visit Plan  cont balance and gait training - give pics for Rt shoulder AAROM    Consulted and Agree with Plan of Care  Patient       Patient will benefit from skilled therapeutic intervention in order to improve the following deficits and impairments:  Abnormal gait, Decreased balance, Decreased cognition, Decreased mobility, Decreased strength, Difficulty walking, Dizziness, Impaired UE functional use, Impaired vision/preception, Postural dysfunction  Visit Diagnosis: Unsteadiness on feet  Muscle weakness (generalized)     Problem List There are no active problems to display for this patient.   Lile, Mccurley, PT 04/16/2017, 8:01 PM  [facility] [address] [place], [place], [address] Phone: [phone]   Fax:  [phone]  Name: MACALA BALDONADO MRN: [id number] Date of Birth: 12/03/1950

## 2017-04-17 ENCOUNTER — Encounter (HOSPITAL_BASED_OUTPATIENT_CLINIC_OR_DEPARTMENT_OTHER): Payer: Medicare Other | Admitting: Psychology

## 2017-04-17 ENCOUNTER — Encounter: Payer: Self-pay | Admitting: Psychology

## 2017-04-17 DIAGNOSIS — R413 Other amnesia: Secondary | ICD-10-CM | POA: Diagnosis not present

## 2017-04-17 NOTE — Progress Notes (Signed)
Neuropsychological Evaluation  Patient:  Deborah Gallegos   DOB: [birth year]  MR Number: [id number]  Location: [facility] [address], [place] [id number] [place] [address] Dept: [phone]  Start: 2 PM End: 3 PM  Provider/Observer:     Edgardo Roys PSYD  Chief Complaint:      Chief Complaint  Patient presents with  . Memory Loss    Reason For Service:     Deborah Gallegos was referred for neuropsychological consultation due to increasing concerns about progressive changes in her memory, executive functioning, and agitation.  Both the patient and her son reports that these difficulties have been appeared to be worsening over the past year and in particular the past 6 months.  The patient is gotten confused while driving on a couple of occasions and has difficulty with mental flexibility.  There have been increasing concerns about the development of "dementia".  The patient is described to have the greatest issues verbally and having difficulty understanding her misunderstanding what is being said.  The patient reports that she is feeling increasingly inadequate and stressed.  The patient has been followed for some time at the neurology clinic/epilepsy clinic through [facility].  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  First seizure was status post resection in 2002.  The patient has had documented seizure activity through EEGs as well as episodes of nonepileptic events of confusion.  MRI in 2015 showed stable meningioma.  The patient has described feeling depressed about having to be followed by neurologist  and her ongoing treatment for seizures.  The patient's son reports that at times she has had between 1 and 2 seizures per day and as little as one per week.  There have been issues of balance etc.  The patient is also been diagnosed with chronic microvascular disease.  Testing Administered:  RBANS  Participation Level:   Active  Participation Quality:  Appropriate      Behavioral Observation:  Well Groomed, Alert, and Appropriate.   Test Results:        RBANS Update Form A Total Scale 87  The patient produced a global/total score index of 87 which falls in the low average range.  This level of functioning does suggest some neuropsychological weaknesses relative to premorbid patients and predictions.  This level of functioning does suggest some general cognitive impairment although some of her individual subtest scores are within normal limits.      RBANS Update Form A Immediate Memory 78 List Learning 4 Story Memory 8  The patient produced an immediate memory index score of 78 which falls in the borderline range of functioning.  This measure assesses the initial encoding and learning complex as well as very simple verbal information.  The patient's current functioning suggest difficulties with verbal learning.  Her greatest difficulty had to do with list learning and the patient had difficulty improving her recall even with repeated presentation of information.        RBANS Update Form A Visuospatial/ Constructional 105 Figure Copy 14 Line Orientation 26-50  The patient produced a visual spatial/constructional index score of 105 which falls in the average range.  The patient did quite well  figure copy produced a scaled score in the high average range.  The patient performed in the average range with regard to the line orientation task where she produced a score between the 26th and 50th percentile.  This measure assesses basic visual spatial perception and ability to copy a design from a  model.       RBANS Update Form A Attention 88 Digit Span10 Coding6  The patient produced significant variability within the attention index measure.  Overall she produced a measure in the low average range with attention index score of 88.  The patient's auditory encoding falls in the average range with a scaled score of 10 for digit span measure.  However, measures of her focus execute abilities (coding) falls in the impaired range with a scaled score of 6.  This measure suggests reduced overall speed of mental operations and difficulties with visual scanning and overall processing speed.  Auditory encoding appears to be within normal limits.          RBANS Update Form A Language 87 Picture Naming 51-75 Semantic Fluency 5  The patient also produced an index score in the low average range with regard to expressive language abilities.  The patient's targeted naming function falls in the 51st to the 75th percentile.  All this performance was within normal limits the patient had difficulty with somatic fluency/verbal fluency task.  She produced a scaled score in the moderately impaired range with regard to verbal fluency.  This level of functioning just some difficulties with fluent use of language.     RBANS Update Form A Delayed Memory 100 List Recall  51-75 List Recognition  51-75 Story Recall  10 Figure Recall  9  In marked contrast to her difficulties with immediate memory patient produced a score in the average range with regard to delayed memory.  The patient produced a delayed memory index score of 100 which falls in the average range.  The patient's list recall falls between the 51st to the 75th percentile in her list recognition between the 51st and 75th percentile.  The patient produced a scaled score of 10 which is in the average range for story recall and a scaled score of 9 which is also in the average range for figure recall.  This level of functioning suggest that while the  patient has some difficulty with initially learning information the information that she does initially learn is retained over a period of delay.  Summary of Results:   The results of the current neuropsychological evaluation do suggest that there are some areas of increasing neuropsychological deficits.  These are primary related to immediate memory and learning as well as some changes in overall expressive language functioning and verbal fluency.  The patient is also showing some difficulties with overall speed of mental operations visual scanning and visual searching.  Most of these deficits have to do frontal lobe/executive functioning abilities.  However, the patient's delayed memory and visual-spatial functioning are all within normal limits and do not suggest aeration.  This overall pattern is not consistent with more common cortical dementia such as Alzheimer's or other frontal lobe/subcortical dementias.  However, given the patient's history of documented seizure activity and surgical interventions for her meningioma as well as subsequent carotid territory stroke preoperatively these changes may be directly related to this recent neurological status.  He does not appear to be indicative of ongoing degenerative dementia is separate from her already existing neurological issues.  Impression/Diagnosis:   The results  of the current neuropsychological evaluation are consistent with significant changes in overall cognitive functioning.  Reduction in overall speed of mental operations, impairments with regard to executive functioning, issues with initial short-term memory function and reduction in verbal fluency are all noted.  The patient does show good abilities with regard to delayed memory as well as overall visual spatial and visual constructional functioning.  As noted previously, these changes/deficits do not appear to be consistent with degenerative dementia is like Alzheimer's or other condition such  as Parkinson's or Lewy body disease.  It does appear that they are directly related to her ongoing and pre-existing neurological condition.   Recommendations:   Due to the patient and her family reporting a significant worsening in her overall function over the past 6 months I do think that we will need to do repeat testing in 6-9 months to look for specific within subject comparisons to fully rule out other neurodegenerative conditions.  I will provide feedback to the patient and her family regarding the results of this current neuropsychological evaluation.  Diagnosis:    Axis I: Memory loss   Ilean Skill, Psy.D. Neuropsychologist

## 2017-04-18 ENCOUNTER — Encounter: Payer: Self-pay | Admitting: Psychology

## 2017-04-18 NOTE — Progress Notes (Signed)
Neuropsychological Consultation   Patient:   Deborah Gallegos   DOB:   12/16/1950  MR Number:  [id number]  Location:  [facility] [address] [place] [id number] [place] [address] Dept: [phone]           Date of Service:   03/24/2017  Start Time:   4 PM End Time:   5 PM  Provider/Observer:  Ilean Skill, Psy.D.       Clinical Neuropsychologist       Billing Code/Service: [phone] 4 Units  Chief Complaint:    Deborah Gallegos is a 67 year old referred by Dr. Criss Rosales due to concerns about progressive memory difficulties and executive functioning issues.  The patient's children have been very worried about what they perceived to be progressive changes with the patient over the past 6 months.  The patient and her son report a gradual change over the past 6 months to a year with increasing concerns about these changes over the past 6 months.  The patient reports that her memory has been more problematic and she is having difficulty understanding what is being told to her and putting concepts together.  She is also describing difficulties with shifting attention and shifting focus and transitioning thoughts/mental flexibility.  The patient has been having more difficulty with following directions and has been getting turned around and not found her way.  However, she reports that this been only a few times over the past year.  The patient's son reports that the patient has been having trouble putting ideas together and issues with her mental flexibility.  He reports that she gets mixed up easy and has difficulty remembering what actually happened in the recent past.  He describes some issues with confabulations.  The patient does have significant medical issues of note.  She has had MRIs that if showed chronic neurovascular disease as well as a history of tumor with surgery removal in 2000 and  03/2000 and the development of seizures with continued issues related to seizure.  Reason for Service:  Deborah Gallegos was referred for neuropsychological consultation due to increasing concerns about progressive changes in her memory, executive functioning, and agitation.  Both the patient and her son reports that these difficulties have been appeared to be worsening over the past year and in particular the past 6 months.  The patient is gotten confused while driving on a couple of occasions and has difficulty with mental flexibility.  There have been increasing concerns about the development of "dementia".  The patient is described to have the greatest issues verbally and having difficulty understanding her misunderstanding what is being said.  The patient reports that she is feeling increasingly inadequate and stressed.  The patient has been followed for some time at the neurology clinic/epilepsy clinic through [facility].  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  First seizure was status post resection in 2002.  The patient has had documented seizure activity through EEGs as well as episodes of nonepileptic events of confusion.  MRI in 2015 showed stable meningioma.  The patient has described feeling depressed about having to be followed by neurologist and her ongoing treatment for seizures.  The patient's son  reports that at times she has had between 1 and 2 seizures per day and as little as one per week.  There have been issues of balance etc.  The patient is also been diagnosed with chronic microvascular disease.  Current Status:  The patient and her family have been describing increasing concerns and difficulties with memory and executive  functioning.  Behavioral Observation: Deborah Gallegos  presents as a 67 y.o.-year-old Right African American Female Right African American Female who appeared her stated age. her dress was Appropriate and she was Well Groomed and her manners were Appropriate to the situation.  her participation was indicative of Appropriate behaviors.  There were not any physical disabilities noted.  she displayed an appropriate level of cooperation and motivation.     Interactions:    Active Appropriate  Attention:   abnormal and attention span appeared shorter than expected for age  Memory:   abnormal; remote memory intact, recent memory impaired  Visuo-spatial:  not examined  Speech (Volume):  low  Speech:   normal; normal  Thought Process:  Coherent and Relevant  Though Content:  WNL; not suicidal and not homicidal  Orientation:   person, place, time/date and situation  Judgment:   Fair  Planning:   Fair  Affect:    Anxious  Mood:    Anxious  Insight:   Fair  Intelligence:   normal  Marital Status/Living: The patient was born and raised in [place].  The patient is widowed and she has 2 grown sons and a grown daughter.  Current Employment: The patient is retired.  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  Substance Use:  No concerns of substance abuse are reported.    Education:   Engineering geologist History:   Past Medical History:  Diagnosis Date  . Hypertension   . Seizure [facility])    Psychiatric History:  No prior psychiatric history  Family Med/Psych History: History reviewed. No pertinent family history.  Risk of Suicide/Violence: virtually non-existent   Impression/DX:  Deborah Gallegos is a 67 year old referred by Dr. Criss Rosales due to concerns about progressive memory difficulties and executive functioning issues.  The patient's children have been  very worried about what they perceived to be progressive changes with the patient over the past 6 months.  The patient and her son report a gradual change over the past 6 months to a year with increasing concerns about these changes over the past 6 months.  The patient reports that her memory has been more problematic and she is having difficulty understanding what is being told to her and putting concepts together.  She is also describing difficulties with shifting attention and shifting focus and transitioning thoughts/mental flexibility.  The patient has been having more difficulty with following directions and has been getting turned around and not found her way.  However, she reports that this been only a few times over the past year.  The patient's son reports that the patient has been having trouble putting ideas together and issues with her mental flexibility.  He reports that she gets mixed up easy and has difficulty remembering what actually happened in the recent past.  He describes some issues with confabulations.  The patient does have significant medical issues of note.  She has had MRIs that if showed chronic neurovascular disease as well as a history of tumor with surgery  removal in 2000 and 03/2000 and the development of seizures with continued issues related to seizure.  Disposition/Plan:  We will set the patient up for formal neuropsychological testing to be conducted on 04/10/2017.  Diagnosis:    Memory loss  Executive function deficit         Electronically Signed   _______________________ Ilean Skill, Psy.D.

## 2017-04-22 ENCOUNTER — Ambulatory Visit: Payer: Medicare Other | Admitting: Physical Therapy

## 2017-04-22 DIAGNOSIS — R2681 Unsteadiness on feet: Secondary | ICD-10-CM | POA: Diagnosis not present

## 2017-04-22 DIAGNOSIS — M6281 Muscle weakness (generalized): Secondary | ICD-10-CM

## 2017-04-23 ENCOUNTER — Encounter: Payer: Self-pay | Admitting: Physical Therapy

## 2017-04-23 NOTE — Therapy (Signed)
[facility] [address] [place], [place], [address] Phone: [phone]   Fax:  [phone]  Physical Therapy Treatment  Patient Details  Name: Deborah Gallegos MRN: [id number] Date of Birth: 03/27/1950 Referring Provider: Lucianne Lei, MD   Encounter Date: 04/22/2017  PT End of Session - 04/23/17 1001    Visit Number  9    Number of Visits  15    Date for PT Re-Evaluation  05/09/17    Authorization Type  [employer] Medicare $20 copay;     PT Start Time  0900 pt arrived 15" late    PT Stop Time  0932    PT Time Calculation (min)  32 min       Past Medical History:  Diagnosis Date  . Hypertension   . Seizure [facility])     Past Surgical History:  Procedure Laterality Date  . ABDOMINAL HYSTERECTOMY    . BRAIN TUMOR EXCISION      There were no vitals filed for this visit.  Subjective Assessment - 04/23/17 0956    Subjective  Pt states she had some mild pain in Rt shoulder this morning - rates 1/10 intensity - attributes to her position last night with reaching over for RW    Patient Stated Goals  To be able to get up from the floor if I fall and improve balance    Currently in Pain?  Yes    Pain Score  1     Pain Location  Shoulder    Pain Orientation  Right    Pain Descriptors / Indicators  Aching    Pain Type  Chronic pain    Pain Onset  More than a month ago    Pain Frequency  Intermittent                      [facility] Adult PT Treatment/Exercise - 04/23/17 0001      Transfers   Transfers  Sit to Stand    Sit to Stand  Without upper extremity assist    Number of Reps  10 reps 5 reps on floor and 5 on Airex      Exercises   Exercises  Shoulder      Knee/Hip Exercises: Aerobic   Recumbent Bike  SciFit level 1.5 x 5" with LE's only  no charge as unsupervised      Knee/Hip Exercises: Standing   Heel Raises  Both;1 set;10 reps      Shoulder Exercises: Seated   Extension  Strengthening;Right;10  reps;Weights 3#    Flexion  Strengthening;Right;10 reps;Weights 2#    Abduction  Strengthening;AROM;10 reps;Weights          Balance Exercises - 04/23/17 1000      Balance Exercises: Standing   Sidestepping  1 rep    Other Standing Exercises  Pt performed standing alterante hip flexion, extension and abductin with minimal UE support on counter;  marching in place x 10 reps each leg          PT Short Term Goals - 04/01/17 9242      PT SHORT TERM GOAL #1   Title  Participate in further assessment of LE strength and balance with five time sit to stand and BERG    Baseline  BERG 11/20: 32/56   Sit/stand baseline 19.22 sec in chair without using handrails    Status  Achieved      PT SHORT TERM GOAL #2   Title  Pt will participate in more indepth vestibular assessment due to dizziness    Status  Deferred      PT SHORT TERM GOAL #3   Title  Pt will perform safe bed mobility on flat bed MOD I (including sitting EOB x 3 minutes to allow BP to regulate)    Status  Achieved      PT SHORT TERM GOAL #4   Title  Pt will decrease falls risk with gait as indicated by increase in gait velocity to > or = 1.8 ft/sec with rollator    Status  Achieved        PT Long Term Goals - 04/23/17 1005      PT LONG TERM GOAL #1   Title  Will be independent with HEP - including exercises for Rt shoulder AAROM    Target Date  05/09/17      PT LONG TERM GOAL #2   Title  Increase BERG score by 8 points to decrease falls risk      PT LONG TERM GOAL #3   Title  Decrease five time sit to stand by 4 seconds to indicate increased functional LE strength    Target Date  05/09/17      PT LONG TERM GOAL #4   Title  Will increase gait velocity to > or = 2.0 ft/sec with rollator and improved L foot clearance on level surfaces    Target Date  05/09/17      PT LONG TERM GOAL #5   Title  Will negotiate 1 step x 4 reps with rollator safely with supervision      PT LONG TERM GOAL #6   Title  Pt will  demonstrate safe floor <> stand transfer with UE support and supervision    Status  Deferred      PT LONG TERM GOAL #7   Title  Pt will improve FOTO by 10 points    Status  Deferred            Plan - 04/23/17 1002    Clinical Impression Statement  Pt continues to demontrate standing balance deficits with unsteadiness with standing balance activities without UE support; able to perform Rt shoulder exercises with minimal c/o pain with pt moving RUE approx. 50% of normal AROM and 30% for active abduction of RUE     Rehab Potential  Good    PT Frequency  2x / week    PT Duration  4 weeks    PT Treatment/Interventions  ADLs/Self Care Home Management;DME Instruction;Gait training;Stair training;Functional mobility training;Therapeutic activities;Therapeutic exercise;Balance training;Neuromuscular re-education;Cognitive remediation;Patient/family education;Orthotic Fit/Training;Vestibular    PT Next Visit Plan  cont balance and gait training - give pics for Rt shoulder AAROM    Consulted and Agree with Plan of Care  Patient       Patient will benefit from skilled therapeutic intervention in order to improve the following deficits and impairments:  Abnormal gait, Decreased balance, Decreased cognition, Decreased mobility, Decreased strength, Difficulty walking, Dizziness, Impaired UE functional use, Impaired vision/preception, Postural dysfunction  Visit Diagnosis: Muscle weakness (generalized)  Unsteadiness on feet     Problem List There are no active problems to display for this patient.   Ronnie, Mallette, PT 04/23/2017, 10:07 AM  [facility] [address] [place], [place], [address] Phone: [phone]   Fax:  [phone]  Name: Deborah Gallegos MRN: [id number] Date of Birth: 25-Sep-1950

## 2017-04-24 ENCOUNTER — Ambulatory Visit (HOSPITAL_COMMUNITY)
Admission: EM | Admit: 2017-04-24 | Discharge: 2017-04-24 | Disposition: A | Discharge status: Home / Self Care | Payer: Medicare Other | Attending: Family Medicine | Admitting: Family Medicine

## 2017-04-24 ENCOUNTER — Ambulatory Visit: Payer: Medicare Other | Admitting: Physical Therapy

## 2017-04-24 ENCOUNTER — Encounter (HOSPITAL_COMMUNITY): Payer: Self-pay

## 2017-04-24 ENCOUNTER — Ambulatory Visit (INDEPENDENT_AMBULATORY_CARE_PROVIDER_SITE_OTHER): Payer: Medicare Other

## 2017-04-24 ENCOUNTER — Other Ambulatory Visit: Payer: Self-pay

## 2017-04-24 ENCOUNTER — Encounter (HOSPITAL_BASED_OUTPATIENT_CLINIC_OR_DEPARTMENT_OTHER): Payer: Medicare Other | Admitting: Psychology

## 2017-04-24 DIAGNOSIS — R413 Other amnesia: Secondary | ICD-10-CM

## 2017-04-24 DIAGNOSIS — S8011XA Contusion of right lower leg, initial encounter: Secondary | ICD-10-CM

## 2017-04-24 DIAGNOSIS — M25571 Pain in right ankle and joints of right foot: Secondary | ICD-10-CM | POA: Diagnosis not present

## 2017-04-24 DIAGNOSIS — R41844 Frontal lobe and executive function deficit: Secondary | ICD-10-CM | POA: Diagnosis not present

## 2017-04-24 DIAGNOSIS — S80811A Abrasion, right lower leg, initial encounter: Secondary | ICD-10-CM

## 2017-04-24 MED ORDER — IBUPROFEN 400 MG PO TABS: 400.0000 mg | ORAL_TABLET | Freq: Four times a day (QID) | ORAL | 0 refills | Status: AC | PRN

## 2017-04-24 MED ORDER — IBUPROFEN 800 MG PO TABS
800.0000 mg | ORAL_TABLET | Freq: Once | ORAL | Status: AC
  Administered 2017-04-24: 800 mg via ORAL

## 2017-04-24 MED ORDER — IBUPROFEN 800 MG PO TABS
ORAL_TABLET | ORAL | Status: AC
  Filled 2017-04-24: qty 1

## 2017-04-24 NOTE — ED Triage Notes (Signed)
Patient presents to [facility] for leg injury since today, pt was moving weights and dropped them on her leg and now has a laceration on rt lower leg

## 2017-04-24 NOTE — ED Notes (Signed)
Wound cleaned and a nonadherent dressing applied to pts RLE.

## 2017-04-24 NOTE — ED Provider Notes (Signed)
Deborah Gallegos    CSN: [id number] Arrival date & time: 04/24/17  5329     History   Chief Complaint Chief Complaint  Patient presents with  . Leg Injury    HPI Deborah Gallegos is a 67 y.o. female.   Deborah Gallegos presents with complaints of right lower leg pain and abrasion. She states she was trying to get to an object which was behind some weights, in trying to move the weight the barbell fell and struck and scraped her right lower leg. She is not on any blood thinners, it minimally bled. 10/10 pain. Worse with weight bearing. Has been ambulatory since but with pain. Without numbness or tingling. Tetanus updated 07/2015. Hx htn and seizures.    ROS per HPI.       Past Medical History:  Diagnosis Date  . Hypertension   . Seizure ([facility])     There are no active problems to display for this patient.   Past Surgical History:  Procedure Laterality Date  . ABDOMINAL HYSTERECTOMY    . BRAIN TUMOR EXCISION      OB History    No data available       Home Medications    Prior to Admission medications   Medication Sig Start Date End Date Taking? Authorizing Provider  amLODipine-benazepril (LOTREL) 5-20 MG capsule Take 1 capsule by mouth daily. 10/05/10  Yes [provider]  BRIVIACT 100 MG TABS Take 1 tablet by mouth 2 (two) times daily. 10/02/16  Yes [provider]  lamoTRIgine (LAMICTAL) 100 MG tablet Take 200-250 tablets by mouth 2 (two) times daily. 250 MG in the morning and 200 MG in the evening 10/01/16  Yes [provider]  rosuvastatin (CRESTOR) 10 MG tablet Take 10 mg by mouth daily.   Yes [provider]  ibuprofen (ADVIL,MOTRIN) 400 MG tablet Take 1 tablet (400 mg total) by mouth every 6 (six) hours as needed. 04/24/17   Zigmund Gottron, NP  meclizine (ANTIVERT) 50 MG tablet Take 1 tablet (50 mg total) by mouth 3 (three) times daily as needed. Patient not taking: Reported on 10/23/2016 12/27/12   Tanna Furry, MD    Family  History History reviewed. No pertinent family history.  Social History Social History   Tobacco Use  . Smoking status: Never Smoker  . Smokeless tobacco: Never Used  Substance Use Topics  . Alcohol use: No  . Drug use: Not on file     Allergies   Patient has no known allergies.   Review of Systems Review of Systems   Physical Exam Triage Vital Signs ED Triage Vitals  Enc Vitals Group     BP 04/24/17 1922 126/71     Pulse Rate 04/24/17 1922 63     Resp 04/24/17 1922 16     Temp 04/24/17 1922 97.7 F (36.5 C)     Temp Source 04/24/17 1922 Oral     SpO2 04/24/17 1922 98 %     Weight --      Height --      Head Circumference --      Peak Flow --      Pain Score 04/24/17 1924 10     Pain Loc --      Pain Edu? --      Excl. in [place]? --    No data found.  Updated Vital Signs BP 126/71 (BP Location: Right Arm)   Pulse 63   Temp 97.7 F (36.5 C) (Oral)  Resp 16   SpO2 98%   Visual Acuity Right Eye Distance:   Left Eye Distance:   Bilateral Distance:    Right Eye Near:   Left Eye Near:    Bilateral Near:     Physical Exam  Constitutional: She is oriented to person, place, and time. She appears well-developed and well-nourished. No distress.  Cardiovascular: Normal rate, regular rhythm and normal heart sounds.  Pulmonary/Chest: Effort normal and breath sounds normal.  Musculoskeletal:       Left lower leg: She exhibits tenderness, bony tenderness and swelling.       Legs: Abrasion, bruising and tenderness to right distal tibia; mild edema into ankle; without foot or toe tenderness, sensation intact; active dorsiflexion and flexion present; strong pedal pulse; capillary refill <2seconds; see photo of abrasion; bleeding currently controlled  Neurological: She is alert and oriented to person, place, and time.  Skin: Skin is warm and dry.       UC Treatments / Results  Labs (all labs ordered are listed, but only abnormal results are displayed) Labs  Reviewed - No data to display  EKG  EKG Interpretation None       Radiology Dg Ankle Complete Right  Result Date: 04/24/2017 CLINICAL DATA:  Injury to the right leg and ankle with laceration and pain EXAM: RIGHT ANKLE - COMPLETE 3+ VIEW COMPARISON:  None. FINDINGS: No acute displaced fracture or malalignment. Moderate plantar calcaneal spur. Large amount of medial and anterior soft tissue swelling. Probable old fracture deformity of the distal fibular shaft. Degenerative spurring medially and laterally. : Soft tissue swelling.  No acute osseous abnormality. Electronically Signed   By: Donavan Foil M.D.   On: 04/24/2017 20:10    Procedures Procedures (including critical care time)  Medications Ordered in UC Medications  ibuprofen (ADVIL,MOTRIN) tablet 800 mg (800 mg Oral Given 04/24/17 1941)     Initial Impression / Assessment and Plan / UC Course  I have reviewed the triage vital signs and the nursing notes.  Pertinent labs & imaging results that were available during my care of the patient were reviewed by me and considered in my medical decision making (see chart for details).     Xray negative for acute fracture. Obvious contusion present. Ice, elevation, ibuprofen for pain control. Wound care provided and discussed. Return precautions provided. Patient verbalized understanding and agreeable to plan.    Final Clinical Impressions(s) / UC Diagnoses   Final diagnoses:  Contusion of right lower extremity, initial encounter  Abrasion of right lower extremity, initial encounter    ED Discharge Orders        Ordered    ibuprofen (ADVIL,MOTRIN) 400 MG tablet  Every 6 hours PRN     04/24/17 2021       Controlled Substance Prescriptions [place] Controlled Substance Registry consulted? Not Applicable   Zigmund Gottron, NP 04/24/17 2026

## 2017-04-24 NOTE — Discharge Instructions (Signed)
Ice and elevation to help with pain and swelling. Cleanse wound daily with soap and water. Keep covered to keep clean. Ibuprofen as needed for pain, take with food. If develop increased redness, discharge or drainage, increased pain or otherwise not improving please return to be seen or follow up with your primary care doctor.

## 2017-04-29 ENCOUNTER — Ambulatory Visit: Payer: Medicare Other | Admitting: Physical Therapy

## 2017-05-01 ENCOUNTER — Ambulatory Visit: Payer: Medicare Other | Admitting: Physical Therapy

## 2017-05-04 NOTE — Progress Notes (Signed)
Today was a face to face feedback session with patient providing interpretation and feedback of results and recommendations to patient.  Below is the results of prior evaluation.  Summary of Results:                        The results of the current neuropsychological evaluation do suggest that there are some areas of increasing neuropsychological deficits.  These are primary related to immediate memory and learning as well as some changes in overall expressive language functioning and verbal fluency.  The patient is also showing some difficulties with overall speed of mental operations visual scanning and visual searching.  Most of these deficits have to do frontal lobe/executive functioning abilities.  However, the patient's delayed memory and visual-spatial functioning are all within normal limits and do not suggest aeration.  This overall pattern is not consistent with more common cortical dementia such as Alzheimer's or other frontal lobe/subcortical dementias.  However, given the patient's history of documented seizure activity and surgical interventions for her meningioma as well as subsequent carotid territory stroke preoperatively these changes may be directly related to this recent neurological status.  He does not appear to be indicative of ongoing degenerative dementia is separate from her already existing neurological issues.  Impression/Diagnosis:                     The results of the current neuropsychological evaluation are consistent with significant changes in overall cognitive functioning.  Reduction in overall speed of mental operations, impairments with regard to executive functioning, issues with initial short-term memory function and reduction in verbal fluency are all noted.  The patient does show good abilities with regard to delayed memory as well as overall visual spatial and visual constructional functioning.  As noted previously, these changes/deficits do not appear to be  consistent with degenerative dementia is like Alzheimer's or other condition such as Parkinson's or Lewy body disease.  It does appear that they are directly related to her ongoing and pre-existing neurological condition.   Recommendations:                          Due to the patient and her family reporting a significant worsening in her overall function over the past 6 months I do think that we will need to do repeat testing in 6-9 months to look for specific within subject comparisons to fully rule out other neurodegenerative conditions.  I will provide feedback to the patient and her family regarding the results of this current neuropsychological evaluation.  Diagnosis:                               Axis I: Memory loss   Ilean Skill, Psy.D. Neuropsychologist

## 2017-05-05 ENCOUNTER — Ambulatory Visit: Payer: Medicare Other | Admitting: Physical Therapy

## 2017-05-06 ENCOUNTER — Ambulatory Visit: Payer: Medicare Other | Admitting: Physical Therapy

## 2017-05-13 ENCOUNTER — Ambulatory Visit: Payer: Medicare Other | Admitting: Physical Therapy

## 2017-05-15 ENCOUNTER — Ambulatory Visit: Payer: Medicare Other | Admitting: Physical Therapy

## 2017-07-01 ENCOUNTER — Encounter: Payer: Self-pay | Admitting: Physical Therapy

## 2017-07-01 ENCOUNTER — Ambulatory Visit: Payer: Medicare Other | Attending: Family Medicine | Admitting: Physical Therapy

## 2017-07-01 DIAGNOSIS — R2689 Other abnormalities of gait and mobility: Secondary | ICD-10-CM | POA: Diagnosis present

## 2017-07-01 DIAGNOSIS — M6281 Muscle weakness (generalized): Secondary | ICD-10-CM

## 2017-07-01 DIAGNOSIS — R2681 Unsteadiness on feet: Secondary | ICD-10-CM

## 2017-07-01 NOTE — Therapy (Signed)
[facility] [address] [place], [place], [address] Phone: [phone]   Fax:  [phone]  Physical Therapy Treatment  Patient Details  Name: Deborah Gallegos MRN: [id number] Date of Birth: [birth year] Referring Provider: Lucianne Lei, MD   Encounter Date: 07/01/2017  PT End of Session - 07/01/17 2118    Visit Number  10    Number of Visits  17    Date for PT Re-Evaluation  07/31/17    Authorization Type  [employer] Medicare $20 copay;     PT Start Time  0847    PT Stop Time  0933    PT Time Calculation (min)  46 min       Past Medical History:  Diagnosis Date  . Hypertension   . Seizure [facility])     Past Surgical History:  Procedure Laterality Date  . ABDOMINAL HYSTERECTOMY    . BRAIN TUMOR EXCISION      There were no vitals filed for this visit.  Subjective Assessment - 07/01/17 2102    Subjective  Pt states Rt leg has healed from incident in which she dropped the weight on it (accident on 04-24-17) - went to ED on 04-24-17; is doing better now    Pertinent History  previous PT with [facility] for back pain, gait abnormality 2016; HTN, seizure, brain tumor excision with resultant R visual impairments, CVA, R shoulder injury after fall, multiple falls    Limitations  Walking    Patient Stated Goals  To be able to get up from the floor if I fall and improve balance    Currently in Pain?  No/denies                 Gait - 3 laps with rollator       [facility] Adult PT Treatment/Exercise - 07/01/17 0858      Transfers   Transfers  Sit to Stand    Sit to Stand  Without upper extremity assist    Sit to Stand Details (indicate cue type and reason)  14.56 secs    Number of Reps  Other reps (comment) 5 reps      Ambulation/Gait   Ambulation/Gait  Yes    Ambulation/Gait Assistance  5: Supervision    Ambulation/Gait Assistance Details  raised rollator height 1 notch    Ambulation Distance (Feet)  350 Feet    Assistive device  Rollator    Ambulation Surface  Level;Indoor    Gait velocity  17.41 secs = 1.88 ft/sec     Curb  4: Min assist      Standardized Balance Assessment   Standardized Balance Assessment  Timed Up and Go Test      Timed Up and Go Test   TUG  Normal TUG    Normal TUG (seconds)  24.78      Knee/Hip Exercises: Aerobic   Recumbent Bike    no charge as unsupervised               PT Short Term Goals - 04/01/17 7614      PT SHORT TERM GOAL #1   Title  Participate in further assessment of LE strength and balance with five time sit to stand and BERG    Baseline  BERG 11/20: 32/56   Sit/stand baseline 19.22 sec in chair without using handrails    Status  Achieved      PT SHORT TERM GOAL #2   Title  Pt will participate in  more indepth vestibular assessment due to dizziness    Status  Deferred      PT SHORT TERM GOAL #3   Title  Pt will perform safe bed mobility on flat bed MOD I (including sitting EOB x 3 minutes to allow BP to regulate)    Status  Achieved      PT SHORT TERM GOAL #4   Title  Pt will decrease falls risk with gait as indicated by increase in gait velocity to > or = 1.8 ft/sec with rollator    Status  Achieved        PT Long Term Goals - 07/01/17 1610      PT LONG TERM GOAL #1   Title  Will be independent with HEP - including exercises for Rt shoulder AAROM    Status  Partially Met    Target Date  07/31/17      PT LONG TERM GOAL #2   Title  Improve TUG score from 24.78 secs with rollator to </= 20 secs with RW to demonstrate improved mobility.    Status  New    Target Date  07/31/17      PT LONG TERM GOAL #3   Title  Perform 5x sit to stand transfers in </= 11 secs without UE support to demo improved LE strength.    Baseline  14.56 secs without UE support    Time  4    Period  Weeks    Status  New    Target Date  07/31/17      PT LONG TERM GOAL #4   Title  Will increase gait velocity to > or = 2.0 ft/sec with rollator and improved  L foot clearance on level surfaces    Baseline  1.25 on eval; 17.41 secs with rollator = 1.88 ft/sec with RW  - 07-01-17    Time  4    Period  Weeks    Status  New    Target Date  07/31/17      PT LONG TERM GOAL #6   Title  Pt will demonstrate safe floor <> stand transfer with UE support and supervision    Baseline  Deferred due to recent diagnosis of Rt rotator cuff tear    Time  4    Period  Weeks    Status  On-going    Target Date  07/31/17      PT LONG TERM GOAL #7   Title  --            Plan - 07/01/17 2119    Clinical Impression Statement  Pt has partially met LTG's with pt continuing to use rollator for assistance with ambulation. Pt now reports no Rt shoulder pain; pt continues to be at risk for fall per TUG score of 24.78 secs and gait velocity 1.88 ft/sec with use of rollator.      Rehab Potential  Good    PT Frequency  2x / week    PT Duration  4 weeks    PT Treatment/Interventions  ADLs/Self Care Home Management;DME Instruction;Gait training;Stair training;Functional mobility training;Therapeutic activities;Therapeutic exercise;Balance training;Neuromuscular re-education;Cognitive remediation;Patient/family education;Orthotic Fit/Training;Vestibular    PT Next Visit Plan  cont balance and gait training     Consulted and Agree with Plan of Care  Patient       Patient will benefit from skilled therapeutic intervention in order to improve the following deficits and impairments:  Abnormal gait, Decreased balance, Decreased cognition, Decreased mobility, Decreased strength, Difficulty  walking, Dizziness, Impaired UE functional use, Impaired vision/preception, Postural dysfunction  Visit Diagnosis: Unsteadiness on feet - Plan: PT plan of care cert/re-cert  Other abnormalities of gait and mobility - Plan: PT plan of care cert/re-cert  Muscle weakness (generalized) - Plan: PT plan of care cert/re-cert     Problem List There are no active problems to display for  this patient.   Elanor, Cale, PT 07/01/2017, 9:31 PM  [facility] [address] [place], [place], [address] Phone: [phone]   Fax:  [phone]  Name: MERIAN WROE MRN: [id number] Date of Birth: Aug 09, 1950

## 2017-07-03 ENCOUNTER — Encounter: Payer: Self-pay | Admitting: Physical Therapy

## 2017-07-03 ENCOUNTER — Ambulatory Visit: Payer: Medicare Other | Attending: Family Medicine | Admitting: Physical Therapy

## 2017-07-03 DIAGNOSIS — R2681 Unsteadiness on feet: Secondary | ICD-10-CM

## 2017-07-03 DIAGNOSIS — R2689 Other abnormalities of gait and mobility: Secondary | ICD-10-CM

## 2017-07-03 DIAGNOSIS — M6281 Muscle weakness (generalized): Secondary | ICD-10-CM | POA: Diagnosis present

## 2017-07-03 NOTE — Therapy (Signed)
[facility] [address] [place], [place], [address] Phone: [phone]   Fax:  [phone]  Physical Therapy Treatment  Patient Details  Name: Deborah Gallegos MRN: [id number] Date of Birth: 08/13/1950 Referring Provider: Lucianne Lei, MD   Encounter Date: 07/03/2017  PT End of Session - 07/03/17 2046    Visit Number  11 2/9    Number of Visits  17    Date for PT Re-Evaluation  07/31/17    Authorization Type  [employer] Medicare $20 copay;     PT Start Time  0856 pt arrived 66" late    PT Stop Time  0930    PT Time Calculation (min)  34 min       Past Medical History:  Diagnosis Date  . Hypertension   . Seizure [facility])     Past Surgical History:  Procedure Laterality Date  . ABDOMINAL HYSTERECTOMY    . BRAIN TUMOR EXCISION      There were no vitals filed for this visit.  Subjective Assessment - 07/03/17 2040    Subjective  Pt reports no changes or problems since last visit     Patient Stated Goals  To be able to get up from the floor if I fall and improve balance    Currently in Pain?  No/denies                       [facility] Adult PT Treatment/Exercise - 07/03/17 0905      Transfers   Transfers  Sit to Stand    Sit to Stand  Without upper extremity assist    Five time sit to stand comments   3 times with feet on floor:  5 times with feet on Airex without UE support    Number of Reps  10 reps      Ambulation/Gait   Ambulation/Gait  Yes    Ambulation/Gait Assistance  5: Supervision    Ambulation Distance (Feet)  250 Feet    Assistive device  Rollator    Ambulation Surface  Level;Indoor    Stairs  Yes    Stairs Assistance  4: Min guard    Stair Management Technique  Two rails;Alternating pattern;Step to pattern;Forwards alternating with ascension; step to with descension    Number of Stairs  4    Height of Stairs  6      Pt performed standing hip extension, abduction, and flexion 10 reps  each leg - alternating LE's - with UE support on // bars prn Marching in place 10 reps each leg with cues to stand erect - with UE support prn on // bars         PT Short Term Goals - 04/01/17 9371      PT SHORT TERM GOAL #1   Title  Participate in further assessment of LE strength and balance with five time sit to stand and BERG    Baseline  BERG 11/20: 32/56   Sit/stand baseline 19.22 sec in chair without using handrails    Status  Achieved      PT SHORT TERM GOAL #2   Title  Pt will participate in more indepth vestibular assessment due to dizziness    Status  Deferred      PT SHORT TERM GOAL #3   Title  Pt will perform safe bed mobility on flat bed MOD I (including sitting EOB x 3 minutes to allow BP to regulate)  Status  Achieved      PT SHORT TERM GOAL #4   Title  Pt will decrease falls risk with gait as indicated by increase in gait velocity to > or = 1.8 ft/sec with rollator    Status  Achieved        PT Long Term Goals - 07/01/17 6004      PT LONG TERM GOAL #1   Title  Will be independent with HEP - including exercises for Rt shoulder AAROM    Status  Partially Met    Target Date  07/31/17      PT LONG TERM GOAL #2   Title  Improve TUG score from 24.78 secs with rollator to </= 20 secs with RW to demonstrate improved mobility.    Status  New    Target Date  07/31/17      PT LONG TERM GOAL #3   Title  Perform 5x sit to stand transfers in </= 11 secs without UE support to demo improved LE strength.    Baseline  14.56 secs without UE support    Time  4    Period  Weeks    Status  New    Target Date  07/31/17      PT LONG TERM GOAL #4   Title  Will increase gait velocity to > or = 2.0 ft/sec with rollator and improved L foot clearance on level surfaces    Baseline  1.25 on eval; 17.41 secs with rollator = 1.88 ft/sec with RW  - 07-01-17    Time  4    Period  Weeks    Status  New    Target Date  07/31/17      PT LONG TERM GOAL #6   Title  Pt will  demonstrate safe floor <> stand transfer with UE support and supervision    Baseline  Deferred due to recent diagnosis of Rt rotator cuff tear    Time  4    Period  Weeks    Status  On-going    Target Date  07/31/17      PT LONG TERM GOAL #7   Title  --            Plan - 07/03/17 2047    Clinical Impression Statement  Pt requires UE support for safety with standing balance exercises; pt's posture is improved with higher height of rollator handles    Rehab Potential  Good    PT Frequency  2x / week    PT Duration  4 weeks    PT Treatment/Interventions  ADLs/Self Care Home Management;DME Instruction;Gait training;Stair training;Functional mobility training;Therapeutic activities;Therapeutic exercise;Balance training;Neuromuscular re-education;Cognitive remediation;Patient/family education;Orthotic Fit/Training;Vestibular    PT Next Visit Plan  cont balance and gait training     Consulted and Agree with Plan of Care  Patient       Patient will benefit from skilled therapeutic intervention in order to improve the following deficits and impairments:  Abnormal gait, Decreased balance, Decreased cognition, Decreased mobility, Decreased strength, Difficulty walking, Dizziness, Impaired UE functional use, Impaired vision/preception, Postural dysfunction  Visit Diagnosis: Unsteadiness on feet  Other abnormalities of gait and mobility     Problem List There are no active problems to display for this patient.   Jirah, Rider, PT 07/03/2017, 8:50 PM  [facility] [address] [place], [place], [address] Phone: [phone]   Fax:  [phone]  Name: Deborah Gallegos MRN: [id number] Date of  Birth: 1950/11/03

## 2017-07-10 ENCOUNTER — Encounter: Payer: Self-pay | Admitting: Physical Therapy

## 2017-07-10 ENCOUNTER — Ambulatory Visit: Payer: Medicare Other | Admitting: Physical Therapy

## 2017-07-10 DIAGNOSIS — R2681 Unsteadiness on feet: Secondary | ICD-10-CM | POA: Diagnosis not present

## 2017-07-10 DIAGNOSIS — M6281 Muscle weakness (generalized): Secondary | ICD-10-CM

## 2017-07-10 DIAGNOSIS — R2689 Other abnormalities of gait and mobility: Secondary | ICD-10-CM

## 2017-07-11 ENCOUNTER — Encounter: Payer: Self-pay | Admitting: Physical Therapy

## 2017-07-11 NOTE — Therapy (Signed)
[facility] [address] [place], [place], [address] Phone: [phone]   Fax:  [phone]  Physical Therapy Treatment  Patient Details  Name: Deborah Gallegos MRN: [id number] Date of Birth: 1950-08-22 Referring Provider: Lucianne Lei, MD   Encounter Date: 07/10/2017  PT End of Session - 07/10/17 1007    Visit Number  12 3/9    Number of Visits  17    Date for PT Re-Evaluation  07/31/17    Authorization Type  [employer] Medicare $20 copay;     PT Start Time  0855    PT Stop Time  0944    PT Time Calculation (min)  49 min       Past Medical History:  Diagnosis Date  . Hypertension   . Seizure [facility])     Past Surgical History:  Procedure Laterality Date  . ABDOMINAL HYSTERECTOMY    . BRAIN TUMOR EXCISION      There were no vitals filed for this visit.  Subjective Assessment - 07/10/17 0903    Subjective  Pt arrives late for 8:00 appt time - thought she had 8:00 dentist appt and actually did not have dentist appt today - arrives here for PT at 8:35; no problems reported since PT session earlier in week    Pertinent History  previous PT with [facility] for back pain, gait abnormality 2016; HTN, seizure, brain tumor excision with resultant R visual impairments, CVA, R shoulder injury after fall, multiple falls    Patient Stated Goals  To be able to get up from the floor if I fall and improve balance    Currently in Pain?  No/denies                       [facility] Adult PT Treatment/Exercise - 07/11/17 0001      Transfers   Transfers  Sit to Stand    Sit to Stand  Without upper extremity assist    Number of Reps  10 reps    Comments  performed on blue Airex without UE support      Ambulation/Gait   Ambulation/Gait  Yes    Ambulation/Gait Assistance  5: Supervision    Ambulation/Gait Assistance Details  raised rollator 1 notch    Ambulation Distance (Feet)  200 Feet    Assistive device  Rollator raised 1  notch    Ambulation Surface  Level;Indoor      Knee/Hip Exercises: Standing   Heel Raises  Both;1 set;10 reps    Hip Flexion  Stengthening;Both;1 set;10 reps;Knee straight 2# weight used on each leg    Hip Abduction  Stengthening;Both;1 set;10 reps 2# weight used on each leg    Hip Extension  Stengthening;Both;1 set;10 reps 2# weight used on each leg    Other Standing Knee Exercises  Hip flexion with knee flexed - 2# weight on each leg - 10 reps each          Balance Exercises - 07/11/17 0927      Balance Exercises: Standing   Rockerboard  Anterior/posterior;EO;10 reps;Intermittent UE support    Retro Gait  2 reps    Sidestepping  3 reps    Other Standing Exercises  Marching on blue Airex pad - 10 reps each leg with CGA without UE support          PT Short Term Goals - 04/01/17 6270      PT SHORT TERM GOAL #1   Title  Participate in further assessment of LE strength and balance with five time sit to stand and BERG    Baseline  BERG 11/20: 32/56   Sit/stand baseline 19.22 sec in chair without using handrails    Status  Achieved      PT SHORT TERM GOAL #2   Title  Pt will participate in more indepth vestibular assessment due to dizziness    Status  Deferred      PT SHORT TERM GOAL #3   Title  Pt will perform safe bed mobility on flat bed MOD I (including sitting EOB x 3 minutes to allow BP to regulate)    Status  Achieved      PT SHORT TERM GOAL #4   Title  Pt will decrease falls risk with gait as indicated by increase in gait velocity to > or = 1.8 ft/sec with rollator    Status  Achieved        PT Long Term Goals - 07/01/17 1219      PT LONG TERM GOAL #1   Title  Will be independent with HEP - including exercises for Rt shoulder AAROM    Status  Partially Met    Target Date  07/31/17      PT LONG TERM GOAL #2   Title  Improve TUG score from 24.78 secs with rollator to </= 20 secs with RW to demonstrate improved mobility.    Status  New    Target Date   07/31/17      PT LONG TERM GOAL #3   Title  Perform 5x sit to stand transfers in </= 11 secs without UE support to demo improved LE strength.    Baseline  14.56 secs without UE support    Time  4    Period  Weeks    Status  New    Target Date  07/31/17      PT LONG TERM GOAL #4   Title  Will increase gait velocity to > or = 2.0 ft/sec with rollator and improved L foot clearance on level surfaces    Baseline  1.25 on eval; 17.41 secs with rollator = 1.88 ft/sec with RW  - 07-01-17    Time  4    Period  Weeks    Status  New    Target Date  07/31/17      PT LONG TERM GOAL #6   Title  Pt will demonstrate safe floor <> stand transfer with UE support and supervision    Baseline  Deferred due to recent diagnosis of Rt rotator cuff tear    Time  4    Period  Weeks    Status  On-going    Target Date  07/31/17      PT LONG TERM GOAL #7   Title  --            Plan - 07/11/17 7588    Clinical Impression Statement  Pt's posture much improved with pt standing more erect after raising rollator 1 notch. Pt stated that she felt she was able to clear left foot easier with walker at higher height.  Pt is progressing towards goals.     Rehab Potential  Good    PT Frequency  2x / week    PT Duration  4 weeks    PT Treatment/Interventions  ADLs/Self Care Home Management;DME Instruction;Gait training;Stair training;Functional mobility training;Therapeutic activities;Therapeutic exercise;Balance training;Neuromuscular re-education;Cognitive remediation;Patient/family education;Orthotic Fit/Training;Vestibular    PT Next Visit Plan  cont balance and gait training     Consulted and Agree with Plan of Care  Patient       Patient will benefit from skilled therapeutic intervention in order to improve the following deficits and impairments:  Abnormal gait, Decreased balance, Decreased cognition, Decreased mobility, Decreased strength, Difficulty walking, Dizziness, Impaired UE functional use,  Impaired vision/preception, Postural dysfunction  Visit Diagnosis: Unsteadiness on feet  Other abnormalities of gait and mobility  Muscle weakness (generalized)     Problem List There are no active problems to display for this patient.   Shagun, Wordell, PT 07/11/2017, 9:31 AM  [facility] [address] [place], [place], [address] Phone: [phone]   Fax:  [phone]  Name: ADILYNN BESSEY MRN: [id number] Date of Birth: 20-Oct-1950

## 2017-07-16 ENCOUNTER — Encounter

## 2017-07-17 ENCOUNTER — Encounter: Payer: Self-pay | Admitting: Physical Therapy

## 2017-07-17 ENCOUNTER — Ambulatory Visit: Payer: Medicare Other | Admitting: Physical Therapy

## 2017-07-17 DIAGNOSIS — R2681 Unsteadiness on feet: Secondary | ICD-10-CM | POA: Diagnosis not present

## 2017-07-17 DIAGNOSIS — M6281 Muscle weakness (generalized): Secondary | ICD-10-CM

## 2017-07-17 DIAGNOSIS — R2689 Other abnormalities of gait and mobility: Secondary | ICD-10-CM

## 2017-07-17 NOTE — Therapy (Signed)
[facility] [address] [place], [place], [address] Phone: [phone]   Fax:  [phone]  Physical Therapy Treatment  Patient Details  Name: Deborah Gallegos MRN: [id number] Date of Birth: January 03, 1951 Referring Provider: Lucianne Lei, MD   Encounter Date: 07/17/2017  PT End of Session - 07/17/17 1343    Visit Number  12 4/9    Number of Visits  17    Date for PT Re-Evaluation  07/31/17    Authorization Type  [employer] Medicare $20 copay;     PT Start Time  0845    PT Stop Time  0932    PT Time Calculation (min)  47 min       Past Medical History:  Diagnosis Date  . Hypertension   . Seizure [facility])     Past Surgical History:  Procedure Laterality Date  . ABDOMINAL HYSTERECTOMY    . BRAIN TUMOR EXCISION      There were no vitals filed for this visit.  Subjective Assessment - 07/17/17 1331    Subjective  Pt states she fell 2 days ago (Tuesday) while trying to pick up rollator by bar on top; did not get hurt - was able to pull herself up to standing; pt states she has felt more off balance the past 2 days - says she is taking 2.5 tablets of her seizure medication and may ask her doctor if she can just take 2 tablets and not the half               Patient Stated Goals  To be able to get up from the floor if I fall and improve balance    Currently in Pain?  No/denies                       [facility] Adult PT Treatment/Exercise - 07/17/17 0001      Transfers   Transfers  Sit to Stand    Sit to Stand  4: Min guard    Number of Reps  10 reps    Comments  5 reps with feet on floor:  5 reps with feet on blue Airex - min assist with LOB       Ambulation/Gait   Ambulation/Gait  Yes    Ambulation/Gait Assistance  5: Supervision    Ambulation/Gait Assistance Details  cues for upright posture and to stay inside RW during turning    Ambulation Distance (Feet)  225 Feet    Assistive device  Rollator raised 1  notch    Ambulation Surface  Level;Indoor      Knee/Hip Exercises: Aerobic   Recumbent Bike  Scifit level 2.0 x 4" with UE's and LE's      Knee/Hip Exercises: Standing   Heel Raises  Both;1 set;10 reps;2 seconds          Balance Exercises - 07/17/17 1341      Balance Exercises: Standing   Standing Eyes Opened  Wide (BOA);Head turns;5 reps    Rockerboard  Anterior/posterior;EO;10 reps;Intermittent UE support;Lateral 10 reps with UE support, 10 reps without UE support     Sidestepping  2 reps inside // bars    Turning  Both;Other (comment) 2 reps inside // bars    Other Standing Exercises  Marching on blue Airex pad - 10 reps each leg with CGA without UE support      Pt performed sidestepping inside // bars - 2 reps with squats for LE strengthening (  10'x 2)  Pt performed stepping over and back of balance beam 5 reps each with bilateral UE support, 5 reps with 1 UE support, then 3 reps with no UE support Marching on Airex 10 reps each leg; alternate tap down to floor 5 reps each foot with min assist   Pt performed forward, back and side kicks 10 reps each with 1 UE support on // bar    PT Short Term Goals - 04/01/17 1517      PT SHORT TERM GOAL #1   Title  Participate in further assessment of LE strength and balance with five time sit to stand and BERG    Baseline  BERG 11/20: 32/56   Sit/stand baseline 19.22 sec in chair without using handrails    Status  Achieved      PT SHORT TERM GOAL #2   Title  Pt will participate in more indepth vestibular assessment due to dizziness    Status  Deferred      PT SHORT TERM GOAL #3   Title  Pt will perform safe bed mobility on flat bed MOD I (including sitting EOB x 3 minutes to allow BP to regulate)    Status  Achieved      PT SHORT TERM GOAL #4   Title  Pt will decrease falls risk with gait as indicated by increase in gait velocity to > or = 1.8 ft/sec with rollator    Status  Achieved        PT Long Term Goals - 07/01/17  6160      PT LONG TERM GOAL #1   Title  Will be independent with HEP - including exercises for Rt shoulder AAROM    Status  Partially Met    Target Date  07/31/17      PT LONG TERM GOAL #2   Title  Improve TUG score from 24.78 secs with rollator to </= 20 secs with RW to demonstrate improved mobility.    Status  New    Target Date  07/31/17      PT LONG TERM GOAL #3   Title  Perform 5x sit to stand transfers in </= 11 secs without UE support to demo improved LE strength.    Baseline  14.56 secs without UE support    Time  4    Period  Weeks    Status  New    Target Date  07/31/17      PT LONG TERM GOAL #4   Title  Will increase gait velocity to > or = 2.0 ft/sec with rollator and improved L foot clearance on level surfaces    Baseline  1.25 on eval; 17.41 secs with rollator = 1.88 ft/sec with RW  - 07-01-17    Time  4    Period  Weeks    Status  New    Target Date  07/31/17      PT LONG TERM GOAL #6   Title  Pt will demonstrate safe floor <> stand transfer with UE support and supervision    Baseline  Deferred due to recent diagnosis of Rt rotator cuff tear    Time  4    Period  Weeks    Status  On-going    Target Date  07/31/17      PT LONG TERM GOAL #7   Title  --            Plan - 07/17/17 1344    Clinical Impression Statement  Pt more unsteady today with dynamic gait activities than she was at time of previous PT session on 07-10-17.  Pt stated that she had gone to bed very late (2:00 am last night) for the last 2 nights and attributes some of the unsteadiness to lack of sleep.  Pt also demonstrated decreased safety awareness during today's session as when she parked RW approx. 3' away from mat table and then amb. to mat to sit down; also with turning during gait - pt had 1 foot outside wheel base of RW and other foot inside - she was instructed to keep both feet inside wheel base for safety.     Rehab Potential  Good    PT Frequency  2x / week    PT Duration  4  weeks    PT Treatment/Interventions  ADLs/Self Care Home Management;DME Instruction;Gait training;Stair training;Functional mobility training;Therapeutic activities;Therapeutic exercise;Balance training;Neuromuscular re-education;Cognitive remediation;Patient/family education;Orthotic Fit/Training;Vestibular    PT Next Visit Plan  cont balance and gait training     Consulted and Agree with Plan of Care  Patient       Patient will benefit from skilled therapeutic intervention in order to improve the following deficits and impairments:  Abnormal gait, Decreased balance, Decreased cognition, Decreased mobility, Decreased strength, Difficulty walking, Dizziness, Impaired UE functional use, Impaired vision/preception, Postural dysfunction  Visit Diagnosis: Other abnormalities of gait and mobility  Unsteadiness on feet  Muscle weakness (generalized)     Problem List There are no active problems to display for this patient.   Tametria, Aho, PT 07/17/2017, 1:50 PM  [facility] [address] [place], [place], [address] Phone: [phone]   Fax:  [phone]  Name: Deborah Gallegos MRN: [id number] Date of Birth: Jul 19, 1950

## 2017-07-22 ENCOUNTER — Ambulatory Visit: Payer: Medicare Other | Admitting: Physical Therapy

## 2017-07-22 DIAGNOSIS — R2681 Unsteadiness on feet: Secondary | ICD-10-CM | POA: Diagnosis not present

## 2017-07-22 DIAGNOSIS — R2689 Other abnormalities of gait and mobility: Secondary | ICD-10-CM

## 2017-07-22 DIAGNOSIS — M6281 Muscle weakness (generalized): Secondary | ICD-10-CM

## 2017-07-23 ENCOUNTER — Encounter: Payer: Self-pay | Admitting: Physical Therapy

## 2017-07-23 NOTE — Therapy (Addendum)
[facility] [address] [place], [place], [address] Phone: [phone]   Fax:  [phone]  Physical Therapy Treatment  Patient Details  Name: Deborah Gallegos MRN: [id number] Date of Birth: 1950-04-01 Referring Provider: Lucianne Lei, MD   Encounter Date: 07/22/2017  PT End of Session - 07/23/17 2254    Visit Number  13 5/9    Number of Visits  17    Date for PT Re-Evaluation  07/31/17    Authorization Type  [employer] Medicare $20 copay;     PT Start Time  0951 pt arrived 20" late    PT Stop Time  1015    PT Time Calculation (min)  24 min       Past Medical History:  Diagnosis Date  . Hypertension   . Seizure [facility])     Past Surgical History:  Procedure Laterality Date  . ABDOMINAL HYSTERECTOMY    . BRAIN TUMOR EXCISION      There were no vitals filed for this visit.  Subjective Assessment - 07/23/17 2250    Subjective  Pt reports no changes or problems since previous visit - states she has been getting more sleep lately so she is feeling better    Patient Stated Goals  To be able to get up from the floor if I fall and improve balance    Currently in Pain?  No/denies             Balance Exercises -       Balance Exercises: Standing   Rockerboard  Anterior/posterior;EO;10 reps;Intermittent UE support    Retro Gait  2 reps    Sidestepping  3 reps    Other Standing Exercises  Marching on blue Airex pad - 10 reps each leg with CGA without UE support        Sit to stand with feet on blue Airex 5 reps without UE support  Pt performed stepping over and back of balance beam inside // bars 5 reps each leg with UE support prn  Sidestepping with squats 10' x 2 reps without UE support on // bar                    PT Short Term Goals - 04/01/17 7106      PT SHORT TERM GOAL #1   Title  Participate in further assessment of LE strength and balance with five time sit to stand and BERG    Baseline  BERG 11/20: 32/56   Sit/stand baseline 19.22 sec in chair without using handrails    Status  Achieved      PT SHORT TERM GOAL #2   Title  Pt will participate in more indepth vestibular assessment due to dizziness    Status  Deferred      PT SHORT TERM GOAL #3   Title  Pt will perform safe bed mobility on flat bed MOD I (including sitting EOB x 3 minutes to allow BP to regulate)    Status  Achieved      PT SHORT TERM GOAL #4   Title  Pt will decrease falls risk with gait as indicated by increase in gait velocity to > or = 1.8 ft/sec with rollator    Status  Achieved        PT Long Term Goals - 07/01/17 0858      PT LONG TERM GOAL #1   Title  Will be independent with HEP - including exercises for  Rt shoulder AAROM    Status  Partially Met    Target Date  07/31/17      PT LONG TERM GOAL #2   Title  Improve TUG score from 24.78 secs with rollator to </= 20 secs with RW to demonstrate improved mobility.    Status  New    Target Date  07/31/17      PT LONG TERM GOAL #3   Title  Perform 5x sit to stand transfers in </= 11 secs without UE support to demo improved LE strength.    Baseline  14.56 secs without UE support    Time  4    Period  Weeks    Status  New    Target Date  07/31/17      PT LONG TERM GOAL #4   Title  Will increase gait velocity to > or = 2.0 ft/sec with rollator and improved L foot clearance on level surfaces    Baseline  1.25 on eval; 17.41 secs with rollator = 1.88 ft/sec with RW  - 07-01-17    Time  4    Period  Weeks    Status  New    Target Date  07/31/17      PT LONG TERM GOAL #6   Title  Pt will demonstrate safe floor <> stand transfer with UE support and supervision    Baseline  Deferred due to recent diagnosis of Rt rotator cuff tear    Time  4    Period  Weeks    Status  On-going    Target Date  07/31/17      PT LONG TERM GOAL #7   Title  --              Patient will benefit from skilled therapeutic intervention in order  to improve the following deficits and impairments:     Visit Diagnosis: Other abnormalities of gait and mobility  Unsteadiness on feet  Muscle weakness (generalized)     Problem List There are no active problems to display for this patient.   Carlicia, Leavens, PT 07/23/2017, 10:58 PM  [facility] [address] [place], [place], [address] Phone: [phone]   Fax:  [phone]  Name: Deborah Gallegos MRN: [id number] Date of Birth: [birth year]

## 2017-07-24 ENCOUNTER — Ambulatory Visit: Payer: Medicare Other | Admitting: Physical Therapy

## 2017-08-05 ENCOUNTER — Ambulatory Visit: Payer: Medicare Other | Attending: Family Medicine | Admitting: Physical Therapy

## 2017-08-05 DIAGNOSIS — M6281 Muscle weakness (generalized): Secondary | ICD-10-CM

## 2017-08-05 DIAGNOSIS — R2689 Other abnormalities of gait and mobility: Secondary | ICD-10-CM

## 2017-08-05 DIAGNOSIS — R2681 Unsteadiness on feet: Secondary | ICD-10-CM | POA: Diagnosis present

## 2017-08-06 ENCOUNTER — Encounter: Payer: Self-pay | Admitting: Physical Therapy

## 2017-08-06 NOTE — Therapy (Signed)
[facility] [address] [place], [place], [address] Phone: [phone]   Fax:  [phone]  Physical Therapy Treatment  Patient Details  Name: Deborah Gallegos MRN: [id number] Date of Birth: 07-09-1950 Referring Provider: Lucianne Lei, MD   Encounter Date: 08/05/2017  PT End of Session - 08/06/17 1518    Visit Number  14 6/9    Number of Visits  17    Date for PT Re-Evaluation  07/31/17    Authorization Type  [employer] Medicare $20 copay;     PT Start Time  0848    PT Stop Time  0933    PT Time Calculation (min)  45 min       Past Medical History:  Diagnosis Date  . Hypertension   . Seizure [facility])     Past Surgical History:  Procedure Laterality Date  . ABDOMINAL HYSTERECTOMY    . BRAIN TUMOR EXCISION      There were no vitals filed for this visit.  Subjective Assessment - 08/06/17 1514    Subjective  Pt reports she had 1 fall - states she slipped when she was cleaning her bathtub -did not get hurt; says her son helped her up    Limitations  Walking    Patient Stated Goals  To be able to get up from the floor if I fall and improve balance    Currently in Pain?  No/denies                       [facility] Adult PT Treatment/Exercise - 08/06/17 0001      Transfers   Transfers  Sit to Stand    Sit to Stand  5: Supervision    Number of Reps  Other reps (comment) 5    Comments  5 reps with feet on floor      Ambulation/Gait   Ambulation/Gait  Yes    Ambulation/Gait Assistance  5: Supervision    Ambulation/Gait Assistance Details  cues for upright posture and for incr. step length    Ambulation Distance (Feet)  230 Feet    Assistive device  Rollator raised 1 notch    Ambulation Surface  Level;Indoor    Stairs  Yes    Stairs Assistance  4: Min guard    Stair Management Technique  Two rails;Alternating pattern;Step to pattern;Forwards alternating with ascension; step to with descension    Number of  Stairs  4    Height of Stairs  6      Knee/Hip Exercises: Aerobic   Recumbent Bike  Scifit level 2.0 x 5" with UE's and LE's      Pt performed standing hip flexion, extension and abduction with 2# weight on each leg x 10 reps each Marching with 2# weight on each leg 10 reps each    Balance Exercises - 08/06/17 1517      Balance Exercises: Standing   Rockerboard  Anterior/posterior;EO;10 reps;Intermittent UE support;Lateral 10 reps with UE support, 10 reps without UE support     Sidestepping  2 reps    Other Standing Exercises  marching on floor inside // bars; pt performed crossovers front and then back 2 reps each inside bars       Tap ups to 1st step with minimal UE support - 10 reps each foot Tap ups to 2nd step with UE support - 10 reps each foot; more difficulty lifting LLE to 2nd step   PT Short Term Goals -  04/01/17 0922      PT SHORT TERM GOAL #1   Title  Participate in further assessment of LE strength and balance with five time sit to stand and BERG    Baseline  BERG 11/20: 32/56   Sit/stand baseline 19.22 sec in chair without using handrails    Status  Achieved      PT SHORT TERM GOAL #2   Title  Pt will participate in more indepth vestibular assessment due to dizziness    Status  Deferred      PT SHORT TERM GOAL #3   Title  Pt will perform safe bed mobility on flat bed MOD I (including sitting EOB x 3 minutes to allow BP to regulate)    Status  Achieved      PT SHORT TERM GOAL #4   Title  Pt will decrease falls risk with gait as indicated by increase in gait velocity to > or = 1.8 ft/sec with rollator    Status  Achieved        PT Long Term Goals - 08/06/17 1522      PT LONG TERM GOAL #1   Title  Will be independent with HEP - including exercises for Rt shoulder AAROM    Baseline  met 03-08-14    Period  Weeks    Status  Partially Met      PT LONG TERM GOAL #2   Title  Improve TUG score from 24.78 secs with rollator to </= 20 secs with RW to  demonstrate improved mobility.    Time  4    Period  Weeks    Status  New    Target Date  08/12/17      PT LONG TERM GOAL #3   Title  Perform 5x sit to stand transfers in </= 11 secs without UE support to demo improved LE strength.    Baseline  14.56 secs without UE support    Time  4    Period  Weeks    Status  New    Target Date  08/12/17      PT LONG TERM GOAL #4   Title  Will increase gait velocity to > or = 2.0 ft/sec with rollator and improved L foot clearance on level surfaces    Baseline  1.25 on eval; 17.41 secs with rollator = 1.88 ft/sec with RW  - 07-01-17    Time  4    Period  Weeks    Status  New    Target Date  08/12/17      PT LONG TERM GOAL #5   Title  Will negotiate 1 step x 4 reps with rollator safely with supervision    Baseline  13.19 secs with no device - 05-05-14    Time  4    Period  Weeks    Status  New    Target Date  08/12/17      PT LONG TERM GOAL #6   Title  Pt will demonstrate safe floor <> stand transfer with UE support and supervision    Time  4    Period  Weeks    Status  On-going    Target Date  08/12/17            Plan - 08/06/17 1519    Clinical Impression Statement  Pt's balance improved today with dynamic standing activities; pt is progressing towards goals; 1 brake on the new rollator which she purchased from [place] is not  working - pt states she is going to return it; requests that order from MD be obtained for new RW - will place message in Epic requesting order for RW    Rehab Potential  Good    PT Frequency  2x / week    PT Duration  4 weeks    PT Treatment/Interventions  ADLs/Self Care Home Management;DME Instruction;Gait training;Stair training;Functional mobility training;Therapeutic activities;Therapeutic exercise;Balance training;Neuromuscular re-education;Cognitive remediation;Patient/family education;Orthotic Fit/Training;Vestibular    PT Next Visit Plan  cont balance and gait training     Consulted and Agree with Plan  of Care  Patient       Patient will benefit from skilled therapeutic intervention in order to improve the following deficits and impairments:  Abnormal gait, Decreased balance, Decreased cognition, Decreased mobility, Decreased strength, Difficulty walking, Dizziness, Impaired UE functional use, Impaired vision/preception, Postural dysfunction  Visit Diagnosis: Other abnormalities of gait and mobility  Unsteadiness on feet  Muscle weakness (generalized)     Problem List There are no active problems to display for this patient.   [email or web address], HSJWT GRMBOBO, PT 08/06/2017, 3:29 PM  [facility] [address] [place], [place], [address] Phone: [phone]   Fax:  [phone]  Name: Deborah Gallegos MRN: [id number] Date of Birth: [birth year]

## 2017-08-07 ENCOUNTER — Telehealth: Payer: Self-pay | Admitting: Physical Therapy

## 2017-08-07 NOTE — Telephone Encounter (Deleted)
Dr. Criss Rosales, Ms. Deborah Gallegos is in need of a new rollator - would you please put an order in Epic for a rollator with a seat?  Thank you,  Guido Sander, PT

## 2017-08-12 ENCOUNTER — Ambulatory Visit: Payer: Medicare Other | Admitting: Physical Therapy

## 2017-08-12 DIAGNOSIS — R2689 Other abnormalities of gait and mobility: Secondary | ICD-10-CM | POA: Diagnosis not present

## 2017-08-12 DIAGNOSIS — R2681 Unsteadiness on feet: Secondary | ICD-10-CM

## 2017-08-12 DIAGNOSIS — M6281 Muscle weakness (generalized): Secondary | ICD-10-CM

## 2017-08-13 ENCOUNTER — Encounter: Payer: Self-pay | Admitting: Physical Therapy

## 2017-08-13 NOTE — Therapy (Signed)
[facility] [address] [place], [place], [address] Phone: [phone]   Fax:  [phone]  Physical Therapy Treatment  Patient Details  Name: Deborah Gallegos MRN: [id number] Date of Birth: Apr 12, 1950 Referring Provider: Lucianne Lei, MD   Encounter Date: 08/12/2017  PT End of Session - 08/13/17 1558    Visit Number  15 7/9    Number of Visits  17    Date for PT Re-Evaluation  08/31/17    Authorization Type  [employer] Medicare $20 copay;     PT Start Time  0847    PT Stop Time  0931    PT Time Calculation (min)  44 min       Past Medical History:  Diagnosis Date  . Hypertension   . Seizure [facility])     Past Surgical History:  Procedure Laterality Date  . ABDOMINAL HYSTERECTOMY    . BRAIN TUMOR EXCISION      There were no vitals filed for this visit.  Subjective Assessment - 08/13/17 1555    Subjective  Pt reports no changes since last week - no falls    Patient Stated Goals  To be able to get up from the floor if I fall and improve balance    Currently in Pain?  No/denies                       [facility] Adult PT Treatment/Exercise - 08/13/17 0001      Transfers   Transfers  Sit to Stand    Sit to Stand  5: Supervision    Number of Reps  Other reps (comment) 5    Comments  5 reps with feet on blue Airex with min guard       Knee/Hip Exercises: Aerobic   Recumbent Bike  Scifit level 2.0 x 5" with UE's and LE's      Knee/Hip Exercises: Standing   Heel Raises  Both;1 set;10 reps;2 seconds    Hip Flexion  Stengthening;Both;1 set;10 reps;Knee straight 2# weight used on each leg    Hip Abduction  Stengthening;Both;1 set;10 reps 2# weight used on each leg    Hip Extension  Stengthening;Both;1 set;10 reps 2# weight used on each leg    Other Standing Knee Exercises  Hip flexion with knee flexed - 2# weight on each leg - 10 reps each      Floor to stand transfer x 2 reps with UE support on mat;  verbal cues for positioning and technique with transfer    Balance Exercises - 08/13/17 1557      Balance Exercises: Standing   Rockerboard  Anterior/posterior;EO;10 reps;Intermittent UE support;Lateral 10 reps with UE support, 10 reps without UE support     Sidestepping  1 rep    Other Standing Exercises  Marching forward and backward inside // bars with minimal UE support 2 reps inside bars      Alternate tap ups to 1st, 2nd steps with CGA to min assist with minimal UE support on rails Stepping over and back of black balance beam with UE support on RW prn with CGA   PT Short Term Goals - 04/01/17 7412      PT SHORT TERM GOAL #1   Title  Participate in further assessment of LE strength and balance with five time sit to stand and BERG    Baseline  BERG 11/20: 32/56   Sit/stand baseline 19.22 sec in chair without using handrails  Status  Achieved      PT SHORT TERM GOAL #2   Title  Pt will participate in more indepth vestibular assessment due to dizziness    Status  Deferred      PT SHORT TERM GOAL #3   Title  Pt will perform safe bed mobility on flat bed MOD I (including sitting EOB x 3 minutes to allow BP to regulate)    Status  Achieved      PT SHORT TERM GOAL #4   Title  Pt will decrease falls risk with gait as indicated by increase in gait velocity to > or = 1.8 ft/sec with rollator    Status  Achieved        PT Long Term Goals - 08/06/17 1522      PT LONG TERM GOAL #1   Title  Will be independent with HEP - including exercises for Rt shoulder AAROM    Baseline  met 03-08-14    Period  Weeks    Status  Partially Met      PT LONG TERM GOAL #2   Title  Improve TUG score from 24.78 secs with rollator to </= 20 secs with RW to demonstrate improved mobility.    Time  4    Period  Weeks    Status  New    Target Date  08/12/17      PT LONG TERM GOAL #3   Title  Perform 5x sit to stand transfers in </= 11 secs without UE support to demo improved LE strength.     Baseline  14.56 secs without UE support    Time  4    Period  Weeks    Status  New    Target Date  08/12/17      PT LONG TERM GOAL #4   Title  Will increase gait velocity to > or = 2.0 ft/sec with rollator and improved L foot clearance on level surfaces    Baseline  1.25 on eval; 17.41 secs with rollator = 1.88 ft/sec with RW  - 07-01-17    Time  4    Period  Weeks    Status  New    Target Date  08/12/17      PT LONG TERM GOAL #5   Title  Will negotiate 1 step x 4 reps with rollator safely with supervision    Baseline  13.19 secs with no device - 05-05-14    Time  4    Period  Weeks    Status  New    Target Date  08/12/17      PT LONG TERM GOAL #6   Title  Pt will demonstrate safe floor <> stand transfer with UE support and supervision    Time  4    Period  Weeks    Status  On-going    Target Date  08/12/17            Plan - 08/13/17 1559    Clinical Impression Statement  Pt did well with floor to stand transfer with verbal cues for technique - pt required SBA for safety with pushing up onto mat.  Pt is progressing towards goals.    Rehab Potential  Good    PT Frequency  2x / week    PT Duration  4 weeks    PT Treatment/Interventions  ADLs/Self Care Home Management;DME Instruction;Gait training;Stair training;Functional mobility training;Therapeutic activities;Therapeutic exercise;Balance training;Neuromuscular re-education;Cognitive remediation;Patient/family education;Orthotic Fit/Training;Vestibular    PT Next  Visit Plan  cont balance and gait training     Consulted and Agree with Plan of Care  Patient       Patient will benefit from skilled therapeutic intervention in order to improve the following deficits and impairments:  Abnormal gait, Decreased balance, Decreased cognition, Decreased mobility, Decreased strength, Difficulty walking, Dizziness, Impaired UE functional use, Impaired vision/preception, Postural dysfunction  Visit Diagnosis: Other abnormalities of  gait and mobility  Unsteadiness on feet  Muscle weakness (generalized)     Problem List There are no active problems to display for this patient.   Ambrielle, Kington, PT 08/13/2017, 4:02 PM  [facility] [address] [place], [place], [address] Phone: [phone]   Fax:  [phone]  Name: Deborah Gallegos MRN: [id number] Date of Birth: 11-Oct-1950

## 2017-08-18 ENCOUNTER — Ambulatory Visit: Payer: Medicare Other | Admitting: Physical Therapy

## 2017-08-21 ENCOUNTER — Ambulatory Visit: Payer: Medicare Other | Admitting: Physical Therapy

## 2017-08-21 DIAGNOSIS — R2689 Other abnormalities of gait and mobility: Secondary | ICD-10-CM | POA: Diagnosis not present

## 2017-08-21 DIAGNOSIS — R2681 Unsteadiness on feet: Secondary | ICD-10-CM

## 2017-08-21 NOTE — Patient Instructions (Signed)
Standing Marching   Using a chair if necessary, march in place. Repeat 10  times. Do 1  sessions per day.  http://gt2.exer.us/344   Copyright  VHI. All rights reserved.   Hip Backward Kick   Using a chair for balance, keep legs shoulder width apart and toes pointed for- ward. Slowly extend one leg back, keeping knee straight. Do not lean forward. Repeat with other leg. Repeat 10 times. Do 1  sessions per day.  http://gt2.exer.us/340   Copyright  VHI. All rights reserved.     Hip Side Kick   Holding a chair for balance, keep legs shoulder width apart and toes pointed forward. Swing a leg out to side, keeping knee straight. Do not lean. Repeat using other leg. Repeat 10  times. Do 1  sessions per day.  http://gt2.exer.us/342   Copyright  VHI. All rights reserved.   Feet Heel-Toe "Tandem", Varied Arm Positions - Eyes Open   With eyes open, right foot directly in front of the other, arms out, look straight ahead at a stationary object. Hold 30 seconds. Repeat 10 times per session. Do 1 sessions per day.  Copyright  VHI. All rights reserved.       Standing On One Leg Without Support .  Stand on one leg in neutral spine without support. Hold 10 seconds. Repeat on other leg. Do 2  repetitions, 1 sets.  http://bt.exer.us/36   Copyright  VHI. All rights reserved.    Side-Stepping   Walk to left side with eyes open. Take even steps, leading with same foot. Make sure each foot lifts off the floor. Repeat in opposite direction.  Do 1 sessions per day.   Copyright  VHI. All rights reserved.         Braiding   Move to side: 1) cross right leg in front of left, 2) bring back leg out to side, then 3) cross right leg behind left, 4) bring left leg out to side. Continue sequence in same direction. Reverse sequence, moving in opposite direction. Repeat sequence 1 times per session. Do 1 sessions per day.

## 2017-08-22 ENCOUNTER — Encounter: Payer: Self-pay | Admitting: Physical Therapy

## 2017-08-22 NOTE — Therapy (Signed)
[facility] [address] [place], [place], [address] Phone: [phone]   Fax:  [phone]  Physical Therapy Treatment  Patient Details  Name: Deborah Gallegos MRN: [id number] Date of Birth: 1950/07/07 Referring Provider: Lucianne Lei, MD   Encounter Date: 08/21/2017  PT End of Session - 08/22/17 1802    Visit Number  16 8/9    Number of Visits  17    Date for PT Re-Evaluation  08/31/17    Authorization Type  [employer] Medicare $20 copay;     PT Start Time  1016    PT Stop Time  1101    PT Time Calculation (min)  45 min       Past Medical History:  Diagnosis Date  . Hypertension   . Seizure [facility])     Past Surgical History:  Procedure Laterality Date  . ABDOMINAL HYSTERECTOMY    . BRAIN TUMOR EXCISION      There were no vitals filed for this visit.  Subjective Assessment - 08/22/17 1756    Subjective  Pt reports no changes/ no falls since visit last week     Limitations  Walking    Patient Stated Goals  To be able to get up from the floor if I fall and improve balance    Currently in Pain?  No/denies                       [facility] Adult PT Treatment/Exercise - 08/22/17 0001      Transfers   Transfers  Sit to Stand    Sit to Stand  5: Supervision    Number of Reps  Other reps (comment) 5    Comments  feet on blue Airex      Knee/Hip Exercises: Aerobic   Recumbent Bike  Scifit level 2.0 x 6" with UE's and LE's      Knee/Hip Exercises: Standing   Heel Raises  Both;1 set;10 reps;2 seconds    Forward Step Up  Both;1 set;10 reps;Hand Hold: 1;Step Height: 6"          Balance Exercises - 08/22/17 1759      Balance Exercises: Standing   Rockerboard  Anterior/posterior;EO;10 reps;Intermittent UE support;Lateral 10 reps with UE support, 10 reps without UE support     Sidestepping  1 rep inside // bars    Other Standing Exercises  Pt performed standing balance exercises for HEP including  forward, back and side kicks  x 10 reps each;  marching in place x 10 reps each; marching forward and backward 2 reps inside // bars;  tandem stance  30 secs hold iwth UE support prn:  SLS 10 sec hold with UE support x 2 reps each:          PT Education - 08/22/17 1802    Education provided  Yes    Education Details  balance exercises for HEP    Person(s) Educated  Patient    Methods  Explanation;Demonstration;Handout    Comprehension  Verbalized understanding;Returned demonstration       PT Short Term Goals - 04/01/17 0922      PT SHORT TERM GOAL #1   Title  Participate in further assessment of LE strength and balance with five time sit to stand and BERG    Baseline  BERG 11/20: 32/56   Sit/stand baseline 19.22 sec in chair without using handrails    Status  Achieved      PT  SHORT TERM GOAL #2   Title  Pt will participate in more indepth vestibular assessment due to dizziness    Status  Deferred      PT SHORT TERM GOAL #3   Title  Pt will perform safe bed mobility on flat bed MOD I (including sitting EOB x 3 minutes to allow BP to regulate)    Status  Achieved      PT SHORT TERM GOAL #4   Title  Pt will decrease falls risk with gait as indicated by increase in gait velocity to > or = 1.8 ft/sec with rollator    Status  Achieved        PT Long Term Goals - 08/06/17 1522      PT LONG TERM GOAL #1   Title  Will be independent with HEP - including exercises for Rt shoulder AAROM    Baseline  met 03-08-14    Period  Weeks    Status  Partially Met      PT LONG TERM GOAL #2   Title  Improve TUG score from 24.78 secs with rollator to </= 20 secs with RW to demonstrate improved mobility.    Time  4    Period  Weeks    Status  New    Target Date  08/12/17      PT LONG TERM GOAL #3   Title  Perform 5x sit to stand transfers in </= 11 secs without UE support to demo improved LE strength.    Baseline  14.56 secs without UE support    Time  4    Period  Weeks    Status  New     Target Date  08/12/17      PT LONG TERM GOAL #4   Title  Will increase gait velocity to > or = 2.0 ft/sec with rollator and improved L foot clearance on level surfaces    Baseline  1.25 on eval; 17.41 secs with rollator = 1.88 ft/sec with RW  - 07-01-17    Time  4    Period  Weeks    Status  New    Target Date  08/12/17      PT LONG TERM GOAL #5   Title  Will negotiate 1 step x 4 reps with rollator safely with supervision    Baseline  13.19 secs with no device - 05-05-14    Time  4    Period  Weeks    Status  New    Target Date  08/12/17      PT LONG TERM GOAL #6   Title  Pt will demonstrate safe floor <> stand transfer with UE support and supervision    Time  4    Period  Weeks    Status  On-going    Target Date  08/12/17            Plan - 08/22/17 1804    Clinical Impression Statement  Pt is progressing well towards goals; continues to need UE support for balance exercises for safety     Rehab Potential  Good    PT Frequency  2x / week    PT Duration  4 weeks    PT Treatment/Interventions  ADLs/Self Care Home Management;DME Instruction;Gait training;Stair training;Functional mobility training;Therapeutic activities;Therapeutic exercise;Balance training;Neuromuscular re-education;Cognitive remediation;Patient/family education;Orthotic Fit/Training;Vestibular    Consulted and Agree with Plan of Care  Patient       Patient will benefit from skilled therapeutic intervention in order  to improve the following deficits and impairments:  Abnormal gait, Decreased balance, Decreased cognition, Decreased mobility, Decreased strength, Difficulty walking, Dizziness, Impaired UE functional use, Impaired vision/preception, Postural dysfunction  Visit Diagnosis: Other abnormalities of gait and mobility  Unsteadiness on feet     Problem List There are no active problems to display for this patient.   EHOZYY, QMGNO IBBCWUG, PT 08/22/2017, [facility] [address] [place], [place], [address] Phone: [phone]   Fax:  [phone]  Name: LIBI CORSO MRN: [id number] Date of Birth: Mar 16, 1950

## 2017-08-25 ENCOUNTER — Ambulatory Visit: Payer: Medicare Other | Admitting: Physical Therapy

## 2017-08-25 ENCOUNTER — Encounter: Payer: Self-pay | Admitting: Physical Therapy

## 2017-08-25 DIAGNOSIS — R2689 Other abnormalities of gait and mobility: Secondary | ICD-10-CM | POA: Diagnosis not present

## 2017-08-25 DIAGNOSIS — R2681 Unsteadiness on feet: Secondary | ICD-10-CM

## 2017-08-25 NOTE — Therapy (Signed)
[facility] [address] [place], [place], [address] Phone: [phone]   Fax:  [phone]  Physical Therapy Treatment  Patient Details  Name: Deborah Gallegos MRN: [id number] Date of Birth: [birth year] Referring Provider: Lucianne Lei, MD   Encounter Date: 08/25/2017  PT End of Session - 08/25/17 1941    Visit Number  17 9/9    Number of Visits  17    Date for PT Re-Evaluation  08/31/17    Authorization Type  [employer] Medicare $20 copay;     PT Start Time  0858 pt arrived 34" late for appt.    PT Stop Time  0932    PT Time Calculation (min)  34 min       Past Medical History:  Diagnosis Date  . Hypertension   . Seizure [facility])     Past Surgical History:  Procedure Laterality Date  . ABDOMINAL HYSTERECTOMY    . BRAIN TUMOR EXCISION      There were no vitals filed for this visit.  Subjective Assessment - 08/25/17 1937    Subjective  Pt states she didn't get chance to practice the balance exercises over the weekend    Patient Stated Goals  To be able to get up from the floor if I fall and improve balance    Currently in Pain?  No/denies                       [facility] Adult PT Treatment/Exercise - 08/25/17 0001      Transfers   Transfers  Sit to Stand    Sit to Stand  5: Supervision    Number of Reps  Other reps (comment) 5    Comments  feet on blue Airex      Ambulation/Gait   Ambulation/Gait  Yes    Ambulation/Gait Assistance  5: Supervision;4: Min guard    Ambulation/Gait Assistance Details  cues to lift LLE as pt had 3 occurrences with Lt toes catching floor     Ambulation Distance (Feet)  300 Feet    Assistive device  Rollator raised 1 notch    Ambulation Surface  Level;Indoor      Knee/Hip Exercises: Aerobic   Recumbent Bike  Scifit level 2.0 x 5" with UE's and LE's      Knee/Hip Exercises: Standing   Heel Raises  Both;1 set;10 reps;2 seconds    Forward Step Up  Both;1 set;10  reps;Hand Hold: 2;Step Height: 6"          Balance Exercises - 08/25/17 1940      Balance Exercises: Standing   Rockerboard  Anterior/posterior;EO;10 reps;Intermittent UE support;Lateral 10 reps with UE support, 10 reps without UE support     Other Standing Exercises  Marching forward and backward inside // bars with minimal UE support 2 reps inside bars          PT Short Term Goals - 04/01/17 8309      PT SHORT TERM GOAL #1   Title  Participate in further assessment of LE strength and balance with five time sit to stand and BERG    Baseline  BERG 11/20: 32/56   Sit/stand baseline 19.22 sec in chair without using handrails    Status  Achieved      PT SHORT TERM GOAL #2   Title  Pt will participate in more indepth vestibular assessment due to dizziness    Status  Deferred  PT SHORT TERM GOAL #3   Title  Pt will perform safe bed mobility on flat bed MOD I (including sitting EOB x 3 minutes to allow BP to regulate)    Status  Achieved      PT SHORT TERM GOAL #4   Title  Pt will decrease falls risk with gait as indicated by increase in gait velocity to > or = 1.8 ft/sec with rollator    Status  Achieved        PT Long Term Goals - 08/06/17 1522      PT LONG TERM GOAL #1   Title  Will be independent with HEP - including exercises for Rt shoulder AAROM    Baseline  met 03-08-14    Period  Weeks    Status  Partially Met      PT LONG TERM GOAL #2   Title  Improve TUG score from 24.78 secs with rollator to </= 20 secs with RW to demonstrate improved mobility.    Time  4    Period  Weeks    Status  New    Target Date  08/12/17      PT LONG TERM GOAL #3   Title  Perform 5x sit to stand transfers in </= 11 secs without UE support to demo improved LE strength.    Baseline  14.56 secs without UE support    Time  4    Period  Weeks    Status  New    Target Date  08/12/17      PT LONG TERM GOAL #4   Title  Will increase gait velocity to > or = 2.0 ft/sec with  rollator and improved L foot clearance on level surfaces    Baseline  1.25 on eval; 17.41 secs with rollator = 1.88 ft/sec with RW  - 07-01-17    Time  4    Period  Weeks    Status  New    Target Date  08/12/17      PT LONG TERM GOAL #5   Title  Will negotiate 1 step x 4 reps with rollator safely with supervision    Baseline  13.19 secs with no device - 05-05-14    Time  4    Period  Weeks    Status  New    Target Date  08/12/17      PT LONG TERM GOAL #6   Title  Pt will demonstrate safe floor <> stand transfer with UE support and supervision    Time  4    Period  Weeks    Status  On-going    Target Date  08/12/17            Plan - 08/25/17 1943    Clinical Impression Statement  Pt had more occurrences today of Lt toes catching floor, but pt able to recover LOB independently: pt's gait imrpoved with cues to lift LLE during swing phase of gait    Rehab Potential  Good    PT Frequency  2x / week    PT Duration  4 weeks    PT Treatment/Interventions  ADLs/Self Care Home Management;DME Instruction;Gait training;Stair training;Functional mobility training;Therapeutic activities;Therapeutic exercise;Balance training;Neuromuscular re-education;Cognitive remediation;Patient/family education;Orthotic Fit/Training;Vestibular    PT Next Visit Plan  check LTG's - D/C    Consulted and Agree with Plan of Care  Patient       Patient will benefit from skilled therapeutic intervention in order to improve the following deficits  and impairments:  Abnormal gait, Decreased balance, Decreased cognition, Decreased mobility, Decreased strength, Difficulty walking, Dizziness, Impaired UE functional use, Impaired vision/preception, Postural dysfunction  Visit Diagnosis: Other abnormalities of gait and mobility  Unsteadiness on feet     Problem List There are no active problems to display for this patient.   Celines, Femia, PT 08/25/2017, 7:53 PM  [facility] [address] [place], [place], [address] Phone: [phone]   Fax:  [phone]  Name: Deborah Gallegos MRN: [id number] Date of Birth: April 22, 1950

## 2017-08-28 ENCOUNTER — Ambulatory Visit: Payer: Medicare Other | Admitting: Physical Therapy

## 2017-08-28 DIAGNOSIS — R2689 Other abnormalities of gait and mobility: Secondary | ICD-10-CM

## 2017-08-28 DIAGNOSIS — M6281 Muscle weakness (generalized): Secondary | ICD-10-CM

## 2017-08-28 DIAGNOSIS — R2681 Unsteadiness on feet: Secondary | ICD-10-CM

## 2017-08-28 NOTE — Patient Instructions (Addendum)
Feet Apart (Compliant Surface) Head Motion - Eyes Closed      DO WITH EYES OPEN FIRST!!   HOLD POSITION FOR 15-20 secs before adding head turns      Stand on compliant surface:  pillow________ with feet shoulder width apart. Close eyes and move head slowly, up and down. Repeat _10___ times per session. Do __1__ sessions per day.

## 2017-08-29 ENCOUNTER — Encounter: Payer: Self-pay | Admitting: Physical Therapy

## 2017-08-29 NOTE — Therapy (Signed)
[facility] [address] [place], [place], [address] Phone: [phone]   Fax:  [phone]  Physical Therapy Treatment  Patient Details  Name: Deborah Gallegos MRN: [id number] Date of Birth: 1950-08-10 Referring Provider: Lucianne Lei, MD   Encounter Date: 08/28/2017  PT End of Session - 08/29/17 1552    Visit Number  18    Number of Visits  17    Date for PT Re-Evaluation  08/31/17    Authorization Type  [employer] Medicare $20 copay;     PT Start Time  0935    PT Stop Time  1016    PT Time Calculation (min)  41 min       Past Medical History:  Diagnosis Date  . Hypertension   . Seizure [facility])     Past Surgical History:  Procedure Laterality Date  . ABDOMINAL HYSTERECTOMY    . BRAIN TUMOR EXCISION      There were no vitals filed for this visit.  Subjective Assessment - 08/29/17 1530    Subjective  Pt reports no changes since previous session    Patient Stated Goals  To be able to get up from the floor if I fall and improve balance    Currently in Pain?  No/denies                       [facility] Adult PT Treatment/Exercise - 08/29/17 0001      Transfers   Transfers  Sit to Stand    Sit to Stand  5: Supervision    Five time sit to stand comments   10.69 secs without UE support    Number of Reps  Other reps (comment) 5    Comments  feet on blue Airex      Ambulation/Gait   Ambulation/Gait  Yes    Ambulation/Gait Assistance  5: Supervision;4: Min guard    Ambulation/Gait Assistance Details  pt had 2 occurrences of toes not clearing floor    Ambulation Distance (Feet)  230 Feet    Assistive device  Rollator raised 1 notch    Ambulation Surface  Level;Indoor    Gait velocity  15.63 secs = 2.32 ft/sec      Timed Up and Go Test   Normal TUG (seconds)  16 with rollator      Knee/Hip Exercises: Standing   Heel Raises  Both;1 set;10 reps;2 seconds    Forward Step Up  Both;1 set;10 reps;Hand  Hold: 2;Step Height: 6"       Scifit level 2.0 x 5" with UE's and LE's for strengthening         PT Short Term Goals - 08/29/17 1546      PT SHORT TERM GOAL #1   Title  Participate in further assessment of LE strength and balance with five time sit to stand and BERG    Status  Achieved      PT SHORT TERM GOAL #2   Title  Pt will participate in more indepth vestibular assessment due to dizziness    Status  Deferred      PT SHORT TERM GOAL #3   Title  Pt will perform safe bed mobility on flat bed MOD I (including sitting EOB x 3 minutes to allow BP to regulate)    Status  Achieved      PT SHORT TERM GOAL #4   Title  Pt will decrease falls risk with gait as indicated by increase  in gait velocity to > or = 1.8 ft/sec with rollator    Baseline  1.25 ft/sec with rollator;   1.95 ft/sec with rollator on 11/27    Status  Achieved        PT Long Term Goals - 08/29/17 1547      PT LONG TERM GOAL #1   Title  Will be independent with HEP - including exercises for Rt shoulder AAROM    Status  Achieved      PT LONG TERM GOAL #2   Title  Improve TUG score from 24.78 secs with rollator to </= 20 secs with RW to demonstrate improved mobility.    Baseline  16.00 secs with rollator -08-28-17    Status  Achieved      PT LONG TERM GOAL #3   Title  Perform 5x sit to stand transfers in </= 11 secs without UE support to demo improved LE strength.    Baseline  14.56 secs without UE support; 12.72 secs 1st trial:  10.69 secs on 2nd trial - 08-28-17    Status  Achieved      PT LONG TERM GOAL #4   Title  Will increase gait velocity to > or = 2.0 ft/sec with rollator and improved L foot clearance on level surfaces    Baseline  1.25 on eval; 17.41 secs with rollator = 1.88 ft/sec with RW  - 07-01-17; 14.12 secs , 15.63 secs = 2.32 ft/sec     Status  Achieved      PT LONG TERM GOAL #5   Title  Will negotiate 1 step x 4 reps with rollator safely with supervision    Status  Achieved      PT  LONG TERM GOAL #6   Title  Pt will demonstrate safe floor <> stand transfer with UE support and supervision    Status  Achieved      PT LONG TERM GOAL #7   Title  Pt will improve FOTO by 10 points    Status  Deferred            Plan - 08/29/17 1553    Clinical Impression Statement  Pt has met LTG's #1-6 with goal #7 deferred due to her being excluded from FOTO intake due to her diagnosis.  Pt continues to use rollator for assistance with ambulation and needs cues to lift LLE to increase clearance in swing phase of gait.  Pt has maximized functional progress at this time - is ready for D/C due to plateau in functional status.      Rehab Potential  Good    PT Frequency  2x / week    PT Duration  4 weeks    PT Treatment/Interventions  ADLs/Self Care Home Management;DME Instruction;Gait training;Stair training;Functional mobility training;Therapeutic activities;Therapeutic exercise;Balance training;Neuromuscular re-education;Cognitive remediation;Patient/family education;Orthotic Fit/Training;Vestibular    PT Next Visit Plan  D/C - all goals met    Consulted and Agree with Plan of Care  Patient       Patient will benefit from skilled therapeutic intervention in order to improve the following deficits and impairments:  Abnormal gait, Decreased balance, Decreased cognition, Decreased mobility, Decreased strength, Difficulty walking, Dizziness, Impaired UE functional use, Impaired vision/preception, Postural dysfunction  Visit Diagnosis: Other abnormalities of gait and mobility  Unsteadiness on feet  Muscle weakness (generalized)     Problem List There are no active problems to display for this patient.   PHYSICAL THERAPY DISCHARGE SUMMARY  Visits from Start of Care: 18  Current functional level related to goals / functional outcomes: See above for progress towards goals   Remaining deficits: Pt continues to have decreased standing balance and decreased independence with  gait with pt using rollator Continued LLE weakness    Education / Equipment: Pt has been instructed in balance HEP;  Order requesting new rollator has been faxed to Dr. Criss Rosales as pt needs a new one due to one brake being broken on her current rollator Plan: Patient agrees to discharge.  Patient goals were met. Patient is being discharged due to meeting the stated rehab goals.  ?????      Pt has been informed that she may return to PT in the future with new orders from MD if she should experience a change in her current functional status.  Pt verbalized understanding.           Sofija, Antwi, PT 08/29/2017, 3:58 PM  [facility] [address] [place], [place], [address] Phone: [phone]   Fax:  [phone]  Name: Deborah Gallegos MRN: [id number] Date of Birth: 19-Jun-1950

## 2017-10-14 NOTE — Progress Notes (Signed)
[facility] Note  10/15/2017     CHIEF COMPLAINT Patient presents for Retina Evaluation   HISTORY OF PRESENT ILLNESS: Deborah Gallegos is a 67 y.o. female who presents to the clinic today for:   HPI    Retina Evaluation    In both eyes.  This started 1.  Associated Symptoms Floaters.  Negative for Flashes, Blind Spot, Photophobia, Pain, Glare, Jaw Claudication, Weight Loss, Fever, Scalp Tenderness, Distortion, Redness, Trauma, Shoulder/Hip pain and Fatigue.  Context:  distance vision, mid-range vision and near vision.  Treatments tried include surgery.  Response to treatment was no improvement.  I, the attending physician,  performed the HPI with the patient and updated documentation appropriately.          Comments    Referral of Dr. Marin Comment for retina eval.Patient states she has had occasional floaters OU appx. 10 yrs, more in the OD than the OS. Pt reports she had exam and received new Rx for glasses ,her vision is still blurry after new RX. Pt had cataract sx appx 5 yrs ago OS, without noted improvement per patient. Pt uses Systane Ou PRN. Pt reports she fell hitting her OS a year ago causing a contusion.No TX needed per patient.        Last edited by Bernarda Caffey, MD on 10/15/2017 10:02 AM. (History)    Pt states she saw Dr. Maryjane Hurter and reports it was the first time she was seen by Dr. Marin Comment; Pt states she needed new glasses rx, states specs "just weren't really working out"; Pt states she sees [facility] Hassell Done at [facility] yearly due to history of brain tumor; Pt reports having a stroke x 5 years ago;   Referring physician: Marin Comment [address] [place], [place], [place] [address]  HISTORICAL INFORMATION:   Selected notes from the MEDICAL RECORD NUMBER Referred by Dr. Maryjane Hurter for concern of decreased vision OU LEE: 08.09.19 (M. Le) [BCVA: OD: 20/40 OS: 20/60+] Ocular Hx-cataract OS, pseudo OD, HTN ret OU PMH-stroke, brain tumor    CURRENT MEDICATIONS: No  current outpatient medications on file. (Ophthalmic Drugs)   No current facility-administered medications for this visit.  (Ophthalmic Drugs)   Current Outpatient Medications (Other)  Medication Sig  . amLODipine-benazepril (LOTREL) 5-20 MG capsule Take 1 capsule by mouth daily.  Marland Kitchen BRIVIACT 100 MG TABS Take 1 tablet by mouth 2 (two) times daily.  Marland Kitchen ibuprofen (ADVIL,MOTRIN) 400 MG tablet Take 1 tablet (400 mg total) by mouth every 6 (six) hours as needed.  . lamoTRIgine (LAMICTAL) 100 MG tablet Take 200-250 tablets by mouth 2 (two) times daily. 250 MG in the morning and 200 MG in the evening  . meclizine (ANTIVERT) 50 MG tablet Take 1 tablet (50 mg total) by mouth 3 (three) times daily as needed.  . rosuvastatin (CRESTOR) 10 MG tablet Take 10 mg by mouth daily.   No current facility-administered medications for this visit.  (Other)      REVIEW OF SYSTEMS: ROS    Positive for: Eyes   Negative for: Constitutional, Gastrointestinal, Neurological, Skin, Genitourinary, Musculoskeletal, HENT, Endocrine, Cardiovascular, Respiratory, Psychiatric, Allergic/Imm, Heme/Lymph   Last edited by Zenovia Jordan, LPN on 3/72/9021  1:15 AM. (History)       ALLERGIES No Known Allergies  PAST MEDICAL HISTORY Past Medical History:  Diagnosis Date  . Hypertension   . Seizure [facility])    Past Surgical History:  Procedure Laterality Date  . ABDOMINAL HYSTERECTOMY    .  BRAIN TUMOR EXCISION    . CATARACT EXTRACTION    . EYE SURGERY      FAMILY HISTORY Family History  Problem Relation Age of Onset  . Hypertension Mother   . Stroke Mother     SOCIAL HISTORY Social History   Tobacco Use  . Smoking status: Never Smoker  . Smokeless tobacco: Never Used  Substance Use Topics  . Alcohol use: No  . Drug use: Never         OPHTHALMIC EXAM:  Base Eye Exam    Visual Acuity (Snellen - Linear)      Right Left   Dist cc 20/50 20/40   Dist ph cc NI NI   Correction:  Glasses        Tonometry (Tonopen, 9:19 AM)      Right Left   Pressure 18 17       Pupils      Dark Light Shape React APD   Right 4 3 Round Brisk None   Left 4 3 Round Brisk None       Visual Fields (Counting fingers)      Left Right     Full       Extraocular Movement      Right Left    Full, Ortho Full, Ortho       Neuro/Psych    Oriented x3:  Yes   Mood/Affect:  Normal       Dilation    Both eyes:  1.0% Mydriacyl, 2.5% Phenylephrine @ 9:20 AM        Slit Lamp and Fundus Exam    Slit Lamp Exam      Right Left   Lids/Lashes Dermatochalasis - upper lid, Meibomian gland dysfunction Dermatochalasis - upper lid, Meibomian gland dysfunction   Conjunctiva/Sclera Melanosis Melanosis   Cornea Arcus, Temporal Well healed cataract wounds Arcus   Anterior Chamber Deep and quiet Deep and quiet   Iris Round and dilated Round and dilated   Lens PC IOL in good position, small PC opening 2+ Nuclear sclerosis, 3+ Cortical cataract   Vitreous Vitreous syneresis, Posterior vitreous detachment Vitreous syneresis       Fundus Exam      Right Left   Disc 2+ Pallor Pink and Sharp   C/D Ratio 0.45 0.4   Macula Good foveal reflex, Retinal pigment epithelial mottling, No heme or edema, Drusen temporal to fovea Good foveal reflex, Retinal pigment epithelial mottling, No heme or edema   Vessels Mild Vascular attenuation, mild AV crossing changes Vascular attenuation, mildly Tortuous   Periphery Attached Attached, scattered peripheral drusen, RPE changes, linear pigmented Chorioretinal scar at 0100 just above ST arcade        Refraction    Manifest Refraction      Sphere Cylinder Axis Dist VA   Right [place] Sphere  20/50   Left -0.50 +0.50 045 20/40-2          IMAGING AND PROCEDURES  Imaging and Procedures for _0 @  OCT, Retina - OU - Both Eyes       Right Eye Quality was good. Central Foveal Thickness: 276. Progression has no prior data. Findings include normal foveal contour, no IRF,  no SRF, retinal drusen .   Left Eye Quality was good. Central Foveal Thickness: 284. Progression has no prior data. Findings include normal foveal contour, no IRF, no SRF.   Notes *Images captured and stored on drive  Diagnosis / Impression:  OD: NFP; no IRF/SRF; mild drusen OS:  NFP, No IRF/SRF  Clinical management:  See below  Abbreviations: NFP - Normal foveal profile. CME - cystoid macular edema. PED - pigment epithelial detachment. IRF - intraretinal fluid. SRF - subretinal fluid. EZ - ellipsoid zone. ERM - epiretinal membrane. ORA - outer retinal atrophy. ORT - outer retinal tubulation. SRHM - subretinal hyper-reflective material        Humphrey Visual Field - OU - Both Eyes       Right Eye Reliability was good. Progression has no prior data. Findings include (Nasal hemifield defect).   Left Eye Reliability was good. Progression has no prior data. Findings include (Superior temporal arcuate defect, sparing center).   Notes ** Images stored on drive **   Incongruous left homonymous hemianopia                ASSESSMENT/PLAN:    ICD-10-CM   1. Visual field defects H53.40   2. 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 Cerebrovascular accident (CVA) due to other mechanism ([place]) I63.89 Humphrey Visual Field - OU - Both Eyes  4. Retinal edema H35.81 OCT, Retina - OU - Both Eyes  5. Pseudophakia, right eye Z96.1   6. Combined forms of age-related cataract of left eye H25.812     1-3. 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 Hassell Done, Neuro-ophthalmologist, at [facility], but has not been seen since 2015 - sphenoid wing meningioma also followed by Neurology at [facility] performed today  - recommend f/u with Dr. Hassell Done -- will try to send copy of HVF  4. No retinal edema on exam or OCT  5.  Combined form age related cataract OS-  - The symptoms of cataract, surgical options, and treatments and risks were discussed with patient. - discussed diagnosis and progression - not yet visually significant - monitor for now  6. Pseudophakia OD  - s/p CE/IOL (?[place])  - beautiful surgery  - monitor   Ophthalmic Meds Ordered this visit:  No orders of the defined types were placed in this encounter.     Return if symptoms worsen or fail to improve.  There are no Patient Instructions on file for this visit.   Explained the diagnoses, plan, and follow up with the patient and they expressed understanding.  Patient expressed understanding of the importance of proper follow up care.   This document serves as a record of services personally performed by Gardiner Sleeper, MD, PhD. It was created on their behalf by Ernest Mallick, OA, an ophthalmic assistant. The creation of this record is the provider's dictation and/or activities during the visit.    Electronically signed by: Ernest Mallick, OA 08.13.2019 4:26 PM    Gardiner Sleeper, M.D., Ph.D. Diseases & Surgery of the Retina and Vitreous [facility]   I have reviewed the above documentation for accuracy and completeness, and I agree with the above. Gardiner Sleeper, M.D., Ph.D. 10/15/17 4:26 PM    Abbreviations: M myopia (nearsighted); A astigmatism; H hyperopia (farsighted); P presbyopia; Mrx spectacle prescription;  CTL contact lenses; OD right eye; OS left eye; OU both eyes  XT exotropia; ET esotropia; PEK punctate epithelial keratitis; PEE punctate epithelial erosions; DES dry eye syndrome; MGD meibomian gland dysfunction; ATs artificial tears; PFAT's preservative free artificial tears; [place] nuclear sclerotic cataract; PSC posterior subcapsular cataract; ERM  epi-retinal membrane; PVD posterior vitreous detachment; RD retinal detachment; DM diabetes mellitus; DR diabetic retinopathy; NPDR non-proliferative  diabetic retinopathy; PDR proliferative diabetic retinopathy; CSME clinically significant macular edema; DME diabetic macular edema; dbh dot blot hemorrhages; CWS cotton wool spot; POAG primary open angle glaucoma; C/D cup-to-disc ratio; HVF humphrey visual field; GVF goldmann visual field; OCT optical coherence tomography; IOP intraocular pressure; BRVO Branch retinal vein occlusion; CRVO central retinal vein occlusion; CRAO central retinal artery occlusion; BRAO branch retinal artery occlusion; RT retinal tear; SB scleral buckle; PPV pars plana vitrectomy; VH Vitreous hemorrhage; PRP panretinal laser photocoagulation; IVK intravitreal kenalog; VMT vitreomacular traction; MH Macular hole;  NVD neovascularization of the disc; NVE neovascularization elsewhere; AREDS age related eye disease study; ARMD age related macular degeneration; POAG primary open angle glaucoma; EBMD epithelial/anterior basement membrane dystrophy; ACIOL anterior chamber intraocular lens; IOL intraocular lens; PCIOL posterior chamber intraocular lens; Phaco/IOL phacoemulsification with intraocular lens placement; [place] photorefractive keratectomy; LASIK laser assisted in situ keratomileusis; HTN hypertension; DM diabetes mellitus; COPD chronic obstructive pulmonary disease

## 2017-10-15 ENCOUNTER — Ambulatory Visit (INDEPENDENT_AMBULATORY_CARE_PROVIDER_SITE_OTHER): Payer: Medicare Other | Admitting: Ophthalmology

## 2017-10-15 ENCOUNTER — Encounter (INDEPENDENT_AMBULATORY_CARE_PROVIDER_SITE_OTHER): Payer: Self-pay | Admitting: Ophthalmology

## 2017-10-15 DIAGNOSIS — D329 Benign neoplasm of meninges, unspecified: Secondary | ICD-10-CM | POA: Diagnosis not present

## 2017-10-15 DIAGNOSIS — H3581 Retinal edema: Secondary | ICD-10-CM

## 2017-10-15 DIAGNOSIS — H25812 Combined forms of age-related cataract, left eye: Secondary | ICD-10-CM

## 2017-10-15 DIAGNOSIS — H5340 Unspecified visual field defects: Secondary | ICD-10-CM

## 2017-10-15 DIAGNOSIS — I6389 Other cerebral infarction: Secondary | ICD-10-CM

## 2017-10-15 DIAGNOSIS — Z961 Presence of intraocular lens: Secondary | ICD-10-CM

## 2017-11-17 ENCOUNTER — Encounter: Payer: Self-pay | Admitting: Psychology

## 2017-11-17 ENCOUNTER — Encounter: Payer: Medicare Other | Attending: Psychology | Admitting: Psychology

## 2017-11-17 DIAGNOSIS — R41844 Frontal lobe and executive function deficit: Secondary | ICD-10-CM

## 2017-11-17 DIAGNOSIS — R569 Unspecified convulsions: Secondary | ICD-10-CM | POA: Diagnosis not present

## 2017-11-17 DIAGNOSIS — I10 Essential (primary) hypertension: Secondary | ICD-10-CM | POA: Insufficient documentation

## 2017-11-17 DIAGNOSIS — R413 Other amnesia: Secondary | ICD-10-CM | POA: Diagnosis present

## 2017-11-17 DIAGNOSIS — Z823 Family history of stroke: Secondary | ICD-10-CM | POA: Diagnosis not present

## 2017-11-17 DIAGNOSIS — Z8249 Family history of ischemic heart disease and other diseases of the circulatory system: Secondary | ICD-10-CM | POA: Diagnosis not present

## 2017-11-17 NOTE — Progress Notes (Signed)
Neuropsychological Consultation   Patient:   Deborah KIMPEL   DOB:   01-13-1951  MR Number:  [id number]  Location:  [facility] [place], [place] [id number] [place] [id number] Dept: [phone]           Date of Service:   11/17/2017  Start Time:   3:30 PM End Time:   4:30 PM  Provider/Observer:  Ilean Skill, Psy.D.       Clinical Neuropsychologist       Billing Code/Service: [phone] Units  Chief Complaint:    Deborah Gallegos is a 67 year old female who was return for follow-up neuropsychological evaluation.  The initial referral was due to increasing concerns about progressive changes in her memory, executive functioning and agitation.  At the time, both the patient and her son felt that the difficulties had been worsening over the past year.  The patient returns today for follow-up neuropsychological evaluation to assess for any progression over the past 6 months and to further facilitate with diagnostic considerations.  Reason for Service:  Deborah Gallegos is a 67 year old female returning for follow-up repeat neuropsychological assessment.  The patient initially presented and was referred due to increasing concerns about progressive changes in memory, executive functioning, and agitation.  There was concerns that these symptoms were worsening particularly over the prior 6 months.  The patient had been confused while driving on various occasions and was having more difficulty with mental flexibility.  The patient has described her greatest difficulties having to do with verbal expression and difficulty understanding or misunderstanding what is being said.  The patient has been followed for some time at the neurology clinic/epilepsy clinic through [facility].  The patient has a history of medial sphenoid wing meningioma S/P craniotomy with resection in 2002  and radiation therapy in 2011 with subsequent carotid territory stroke in the preoperative.  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  First seizure was status post resection in 2012.  The seizures have been documented through EEG.  MRI in 2015 showed a stable meningioma.  The patient reports that she is continuing to have seizures but they are being better managed.  The patient reports that her family is still telling her that she is having memory deficits and confusion as well as expressive language difficulties.  The patient was scheduled to have a repeat MRI in January 2019 but on her way into the building for her MRI she had a significant fall and was taken to the emergency department.  The MRI has not been rescheduled.  The patient had been having increasing falls but has been referred for physical therapy and reports that she is graduated from the physical therapy program and is continuing to physical therapy at home.  The patient reports that she has had less falls.  She does report she is still having seizures.  The patient reports that she is not having any significant worsening of her symptoms over the past couple of months but there was not another family member with her in the appointment to confirm this.  Current Status:  The patient reports ongoing memory, executive functioning, reasoning and problem-solving, and expressive language difficulties.  The patient reports that she is having less frequent seizures.  The patient was scheduled for an MRI in January but because of a significant fall this appointment was not  completed.  The patient is continuing to use a walker for stability.  She reports that she has had many falls but that her frequency of falls has been reducing.  Reliability of Information: The information is provided from a 1 hour face-to-face interview as well as review of previous medical  records.  Behavioral Observation: Deborah Gallegos  presents as a 67 y.o.-year-old Right African American Female who appeared her stated age. her dress was Appropriate and she was Well Groomed and her manners were Appropriate to the situation.  her participation was indicative of Appropriate behaviors.  There were any physical disabilities noted.  she displayed an appropriate level of cooperation and motivation.     Interactions:    Active Appropriate  Attention:   abnormal and attention span appeared shorter than expected for age  Memory:   abnormal; global memory impairment noted  Visuo-spatial:  not examined  Speech (Volume):  low  Speech:   normal; slowed verbal response times  Thought Process:  Coherent and Relevant  Though Content:  WNL; not suicidal and not homicidal  Orientation:   person, place, time/date and situation  Judgment:   Fair  Planning:   Poor  Affect:    Blunted  Mood:    Dysphoric  Insight:   Fair  Intelligence:   normal   Medical History:   Past Medical History:  Diagnosis Date  . Hypertension   . Seizure [facility])     Family Med/Psych History:  Family History  Problem Relation Age of Onset  . Hypertension Mother   . Stroke Mother     Risk of Suicide/Violence: low the patient denies any suicidal or homicidal ideation.  Impression/DX:  Deborah Gallegos is a 67 year old female returning for follow-up repeat neuropsychological assessment.  The patient initially presented and was referred due to increasing concerns about progressive changes in memory, executive functioning, and agitation.  There was concerns that these symptoms were worsening particularly over the prior 6 months.  The patient had been confused while driving on various occasions and was having more difficulty with mental flexibility.  The patient has described her greatest difficulties having to do with verbal expression and difficulty understanding or misunderstanding what is being  said.  The patient has been followed for some time at the neurology clinic/epilepsy clinic through [facility].  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  First seizure was status post resection in 2012.  The seizures have been documented through EEG.  MRI in 2015 showed a stable meningioma.  The patient reports that she is continuing to have seizures but they are being better managed.  The patient reports that her family is still telling her that she is having memory deficits and confusion as well as expressive language difficulties.  The patient was scheduled to have a repeat MRI in January 2019 but on her way into the building for her MRI she had a significant fall and was taken to the emergency department.  The MRI has not been rescheduled.  The patient had been having increasing falls but has been referred for physical therapy and reports that she is graduated from the physical therapy program and is continuing to physical therapy at home.  The patient reports that she has had  less falls.  She does report she is still having seizures.  The patient reports that she is not having any significant worsening of her symptoms over the past couple of months but there was not another family member with her in the appointment to confirm this.  The patient reports ongoing memory, executive functioning, reasoning and problem-solving, and expressive language difficulties.  The patient reports that she is having less frequent seizures.  The patient was scheduled for an MRI in January but because of a significant fall this appointment was not completed.  The patient is continuing to use a walker for stability.  She reports that she has  had many falls but that her frequency of falls has been reducing.  Disposition/Plan:  We have set the patient up for follow-up repeat neuropsychological testing.  The patient will complete the RBANS B to allow for direct comparisons to the testing that was conducted 6 months ago.  I have also instructed the patient with both verbal and written instructions to contact the neurologist at [facility] health system to reschedule the MRI that had been attempted to get done in January but was not done after significant fall on her way End to have the MRI done.  I hope to have the MRI completed before I see her for the follow-up testing.  Diagnosis:    Memory loss  Executive function deficit         Electronically Signed   _______________________ Ilean Skill, Psy.D.

## 2017-11-21 ENCOUNTER — Telehealth: Payer: Self-pay | Admitting: Psychology

## 2017-11-21 NOTE — Telephone Encounter (Signed)
Pt called to say that Dr Sima Matas has requested her to have a MRI on her head to check the tumor on the right side.   She needs a referring MD to order that MRI for her to have it done.  Please advise & call  Thanks,  Vilinda Blanks.

## 2018-02-03 ENCOUNTER — Encounter: Payer: Medicare Other | Admitting: Psychology

## 2018-02-10 ENCOUNTER — Encounter: Payer: Medicare Other | Admitting: Psychology

## 2018-02-17 ENCOUNTER — Ambulatory Visit: Payer: Medicare Other | Admitting: Psychology

## 2018-02-23 ENCOUNTER — Encounter

## 2018-02-23 ENCOUNTER — Encounter: Payer: Medicare Other | Attending: Psychology | Admitting: Psychology

## 2018-02-23 ENCOUNTER — Encounter: Payer: Self-pay | Admitting: Psychology

## 2018-02-23 DIAGNOSIS — R413 Other amnesia: Secondary | ICD-10-CM | POA: Diagnosis present

## 2018-02-23 NOTE — Progress Notes (Signed)
Behavior Observations Patient was on time to her 8:00am appointment. She was administered the Repeatable Battery for the Assessment of Neuropsychological Status (Form B) to compare with results from previous testing. She was appropriately dressed and well groomed. She used a walker for a assistance to help with mobility. She displayed an appropriate level of cooperation and motivation. Mood was anxious but mostly euthymic with appropriate affect.  Thought processes were goal directed and linear.  Patient did not bring her reading glasses the appointment but denied any difficulty seeing during testing.   Results of RBANS-Form B  Measure  Standard Score/ Scaled Score Percentile Description  Immediate Memory 85 16 Low Average  List Learning 7 16 Low Average  Story Memory 8 25 Average  Visuospatial/Constructional 96 40 Average  Figure Copy 14 91 Superior  Line Orientation - 3-9 Borderline  Language 87 19 Low Average  Picture Naming - 51-75 Average  Semantic Fluency 5 5 Borderline  Attention 75 5 Borderline  Digit Span 7 16 Low Average  Coding 5 5 Borderline  Delayed Memory 84 14 Low Average  List Recall - 26-50 Average  List Recognition - 10-16 Low Average  Story Recall 7 16 Low Average  Figure Recall 10 50 Average  Total  81 10 Low Average

## 2018-03-16 ENCOUNTER — Encounter: Payer: Self-pay | Admitting: Psychology

## 2018-03-16 ENCOUNTER — Encounter: Payer: Medicare Other | Attending: Psychology | Admitting: Psychology

## 2018-03-16 DIAGNOSIS — R413 Other amnesia: Secondary | ICD-10-CM | POA: Diagnosis present

## 2018-03-16 DIAGNOSIS — R41844 Frontal lobe and executive function deficit: Secondary | ICD-10-CM | POA: Diagnosis not present

## 2018-03-16 NOTE — Progress Notes (Signed)
Neuropsychological Evaluation  Patient:  Deborah Gallegos   DOB: [birth year]  MR Number: [id number]  Location: [facility] PHYSICAL MEDICINE AND REHABILITATION [place], [place] [id number] [place] [address] Dept: [phone]  Start: 2 PM End: 3 PM  Provider/Observer:     Edgardo Roys PsyD  Chief Complaint:      Chief Complaint  Patient presents with  . Memory Loss    Reason For Service:      Deborah Gallegos is a 68 year old female returning for follow-up repeat neuropsychological assessment.  The patient initially presented and was referred due to increasing concerns about progressive changes in memory, executive functioning, and agitation.  There was concerns that these symptoms were worsening particularly over the prior 6 months.  The patient had been confused while driving on various occasions and was having more difficulty with mental flexibility.  The patient has described her greatest difficulties having to do with verbal expression and difficulty understanding or misunderstanding what is being said.  The patient has been followed for some time at the neurology clinic/epilepsy clinic through [place].  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  First seizure was status post resection in 2012.  The seizures have been documented through EEG.  MRI in 2015 showed a stable meningioma.  The patient reports that she is continuing to have seizures but they are being better managed.  The patient reports that her family is still telling her that she is having memory deficits and confusion as well as expressive language  difficulties.  The patient was scheduled to have a repeat MRI in January 2019 but on her way into the building for her MRI she had a significant fall and was taken to the emergency department.  The MRI has not been rescheduled.  The patient had been having increasing falls but has been referred for physical therapy and reports that she is graduated from the physical therapy program and is continuing to physical therapy at home.  The patient reports that she has had less falls.  She does report she is still having seizures.  The patient reports that she is not having any significant worsening of her symptoms over the past couple of months but there was not another family member with her in the appointment to confirm this.  The patient had initial testing done on 04/10/2017 and this is the 9+ month follow-up.  The current scores will be compared with previous testing.   Testing Administered:  RBANS A (2-19) and RBANS B (12/19)  Participation Level:   Active  Participation Quality:  Appropriate      Behavioral Observation:  Well Groomed, Alert, and Appropriate.   Below are the behavioral observations by Dr. Darol Destine from the test administration done 02/23/2018.    Patient was on time to her 8:00am appointment. She was administered the Repeatable Battery for the Assessment of Neuropsychological Status (Form B) to compare with results from previous testing. She was appropriately dressed and well groomed. She used a walker for a assistance to help with mobility. Shedisplayed an appropriatelevel of cooperation and motivation. Mood was anxious but mostly euthymic with appropriate affect.  Thought processes were goal directed and linear.  Patient did not bring her reading glasses the appointment but denied any difficulty seeing during testing.  Test Results:        RBANS Update Form A 2/19 Total Scale 87      Form B Total Scale 12/19  81  The patient produced a global/total index score of 87 on the initial  testing, which falls at the lower end of the average range.  Her most recent total scale index score was 81 and 6 points below the initial testing.  This does suggest some neuropsychological weakness relative to premorbid predictions.        RBANS Update Form A Immediate Memory 78    RBANS Update Form B Immediate Memory  85 2/19:    12/19: List Learning 4    List Learning 7 Story Memory 8    Story Memory 8  The patient initially (2/19) produced an immediate memory index score of 78, which fell in the borderline range of functioning.  The patient showed improvements with regard to list learning on the most recent assessment and the same level of performance for story memory learning from the initial testing.  Her overall immediate index score was an 85 indicating a seven-point increase in overall functioning.  The patient's current functioning suggests only mild issues with verbal learning.          RBANS Update Form A Visuospatial/ Constructional (2/19)       105      RBANS Update Form B Visuospatial/ Constructional (12/19) 96   The patient initially produced a visual spatial/constructional index score of 105 which falls in the average range.  While her most recent performance was a 96 and 9 points below her initial testing it was still also in the average range and with regard to confidence levels these 2 scores do overlap and correlate with each other.        RBANS Update Form A Attention 88      RBANS Update Form B Attention 75  Digit Span   10    Digit Span:  7 Coding          6    Coding       5   During the initial assessment in December 2019 the patient produced an attentional index score of 88 which falls in the low average range with an average performance with regard to auditory encoding and a mild to moderate impairment with regard to information processing speed.  On this most recent evaluation the patient produced an attentional index score of 75 which falls in the  borderline range of functioning and does represent a mild loss in overall functioning.  The patient had significantly lower scores with regard to auditory encoding with a lower but generally consistent measure for information processing speed and visual scanning/visual searching.        RBANS Update Form A Language 87    RBANS Update Form B Language 87 Picture Naming 51-75    Picture Naming 51-75  Semantic Fluency 5    Semantic Fluency  5  On the initial language index measure the patient produced an index score of 87 which falls in the low average range for expressive language abilities.  The patient had average performance with regard to naming abilities where there was cueing/targeted naming.  Her deficit at the time was primarily in exclusively with regard to somatic fluency/verbal fluency measures.  The patient produced identical scores on this most recent testing also showing good  targeted/cued naming abilities but significant impairments with regard to verbal fluency measures.      RBANS Update Form A Delayed Memory 100   RBANS Update Form B Delayed Memory 84 List Recall  51-75   list recall 26-50  List Recognition  51-75   list recognition 10-16 Story Recall  10   story recall 7 Figure Recall  9   figure recall 10   While the initial assessment in December 2019 showed a delayed memory index score of 100 which fell in the average range and at the 50th percentile.  The patient shows a significant deterioration in delayed memory index score as her current assessment produced an index score of 84 which is a 16 point difference.  While the patient did generally well with regard to list recall after delay her most recent evaluation did show some deterioration.  The patient showed significant deterioration in her list recognition suggesting difficulties with storage of information.  She also showed a deterioration in her story recall but she did well with regard to her visual-spatial memory  after a delay.     Summary of Results:   The results of the follow-up neuropsychological evaluation do suggest that there have been some changes in overall neuropsychological/cognitive functioning.  The patient is shown a mild worsening of her global neuropsychological/cognitive functioning.  This finding is primarily in the area of delayed recall of information.  She has a 16 point decrease in delayed memory over the previous testing related to verbal memory recall.  However, her visual-spatial recall has stayed steady.  The patient's overall expressive language is remained steady and consistent on both assessments.  The patient actually shows improvements with regard to her immediate memory index score.  The patient also shows some mild decrease in auditory encoding.  Overall, the deficits that have worsened since the initial assessment have to do with attention and concentration/auditory encoding as well as delayed verbal memory.  I do think that these 2 measures are related to each other.  The patient's immediate memory was consistent from the previous testing.  Overall, this pattern continues to not be particularly consistent with more common cortical dementias and is likely related to continued difficulties with regard to issues related to seizure activity and surgical interventions and effects of her carotid territory stroke.  Impression/Diagnosis:   The results of the current neuropsychological evaluation do show some continued difficulties with regard to auditory encoding and retrieval of previously stored information.  The patient's initial learning remained stable and her targeted/cued naming/language abilities was also quite stable.  The patient had more difficulty with regard to verbal fluency and retrieval of previously stored information as well as auditory attentional issues.  The patient reports that she feels like she is still performing consistently with the way she was 1 year before and she  does not identify any significant loss in overall functioning.  I do think that the neuropsychological/cognitive deficits noted are consistent with the residual effects of her neurological conditions already noted.   Diagnosis:    Axis I: Memory loss  Executive function deficit   Ilean Skill, Psy.D. Neuropsychologist

## 2018-03-17 ENCOUNTER — Encounter: Payer: Medicare Other | Admitting: Psychology

## 2018-03-17 DIAGNOSIS — R41844 Frontal lobe and executive function deficit: Secondary | ICD-10-CM | POA: Diagnosis not present

## 2018-03-17 DIAGNOSIS — R413 Other amnesia: Secondary | ICD-10-CM | POA: Diagnosis not present

## 2018-03-23 ENCOUNTER — Encounter: Payer: Self-pay | Admitting: Psychology

## 2018-03-29 ENCOUNTER — Encounter: Payer: Self-pay | Admitting: Psychology

## 2018-03-29 NOTE — Progress Notes (Signed)
Today I provided feedback regarding the results of the recent neuropsychological evaluation.  We worked on interpretation of these results with the patient and reviewed particular goals going forward.  Below is the summary results of that evaluation from 03/16/2018.  Summary of Results:                        The results of the follow-up neuropsychological evaluation do suggest that there have been some changes in overall neuropsychological/cognitive functioning.  The patient is shown a mild worsening of her global neuropsychological/cognitive functioning.  This finding is primarily in the area of delayed recall of information.  She has a 16 point decrease in delayed memory over the previous testing related to verbal memory recall.  However, her visual-spatial recall has stayed steady.  The patient's overall expressive language is remained steady and consistent on both assessments.  The patient actually shows improvements with regard to her immediate memory index score.  The patient also shows some mild decrease in auditory encoding.  Overall, the deficits that have worsened since the initial assessment have to do with attention and concentration/auditory encoding as well as delayed verbal memory.  I do think that these 2 measures are related to each other.  The patient's immediate memory was consistent from the previous testing.  Overall, this pattern continues to not be particularly consistent with more common cortical dementias and is likely related to continued difficulties with regard to issues related to seizure activity and surgical interventions and effects of her carotid territory stroke.  Impression/Diagnosis:                     The results of the current neuropsychological evaluation do show some continued difficulties with regard to auditory encoding and retrieval of previously stored information.  The patient's initial learning remained stable and her targeted/cued naming/language abilities was  also quite stable.  The patient had more difficulty with regard to verbal fluency and retrieval of previously stored information as well as auditory attentional issues.  The patient reports that she feels like she is still performing consistently with the way she was 1 year before and she does not identify any significant loss in overall functioning.  I do think that the neuropsychological/cognitive deficits noted are consistent with the residual effects of her neurological conditions already noted.   Diagnosis:                               Axis I: Memory loss  Executive function deficit   Ilean Skill, Psy.D. Neuropsychologist

## 2018-04-11 ENCOUNTER — Other Ambulatory Visit: Payer: Self-pay

## 2018-04-11 ENCOUNTER — Ambulatory Visit: Payer: Medicare Other

## 2018-04-11 ENCOUNTER — Ambulatory Visit
Admission: EM | Admit: 2018-04-11 | Discharge: 2018-04-11 | Disposition: A | Discharge status: Home / Self Care | Payer: Medicare Other | Attending: Family Medicine | Admitting: Family Medicine

## 2018-04-11 DIAGNOSIS — W19XXXA Unspecified fall, initial encounter: Secondary | ICD-10-CM | POA: Diagnosis not present

## 2018-04-11 DIAGNOSIS — M79645 Pain in left finger(s): Secondary | ICD-10-CM

## 2018-04-11 NOTE — Discharge Instructions (Addendum)
The x-ray was negative for any fractures of your wrist or hand We will put a splint on the hand to help stabilize the thumb. You can do ice a few times a day for 10 to 15 minutes Ibuprofen as needed for pain inflammation Follow up as needed for continued or worsening symptoms

## 2018-04-11 NOTE — ED Provider Notes (Signed)
[place]    CSN: [id number] Arrival date & time: 04/11/18  1425     History   Chief Complaint Chief Complaint  Patient presents with  . Hand Pain    HPI Deborah Gallegos is a 68 y.o. female.   Pt is a 67 year old female that presents with fall. She fell yesterday evening and reached out with the left hand injuring the left thumb and wrist. Mild swelling to the left thumb. Limited ROM due to the pain with slight swelling at the MCP joint. Mild pain in the wrist, radially. Good ROM. Denies any numbness, tingling.   ROS per HPI      Past Medical History:  Diagnosis Date  . Hypertension   . Seizure ([place])     There are no active problems to display for this patient.   Past Surgical History:  Procedure Laterality Date  . ABDOMINAL HYSTERECTOMY    . BRAIN TUMOR EXCISION    . CATARACT EXTRACTION    . EYE SURGERY      OB History   No obstetric history on file.      Home Medications    Prior to Admission medications   Medication Sig Start Date End Date Taking? Authorizing Provider  amLODipine-benazepril (LOTREL) 5-20 MG capsule Take 1 capsule by mouth daily. 10/05/10   [provider]  BRIVIACT 100 MG TABS Take 1 tablet by mouth 2 (two) times daily. 10/02/16   [provider]  ibuprofen (ADVIL,MOTRIN) 400 MG tablet Take 1 tablet (400 mg total) by mouth every 6 (six) hours as needed. 04/24/17   Zigmund Gottron, NP  lamoTRIgine (LAMICTAL) 100 MG tablet Take 200-250 tablets by mouth 2 (two) times daily. 250 MG in the morning and 200 MG in the evening 10/01/16   [provider]  meclizine (ANTIVERT) 50 MG tablet Take 1 tablet (50 mg total) by mouth 3 (three) times daily as needed. 12/27/12   Tanna Furry, MD  rosuvastatin (CRESTOR) 10 MG tablet Take 10 mg by mouth daily.    [provider]    Family History Family History  Problem Relation Age of Onset  . Hypertension Mother   . Stroke Mother     Social History Social  History   Tobacco Use  . Smoking status: Never Smoker  . Smokeless tobacco: Never Used  Substance Use Topics  . Alcohol use: No  . Drug use: Never     Allergies   Patient has no known allergies.   Review of Systems Review of Systems   Physical Exam Triage Vital Signs ED Triage Vitals  Enc Vitals Group     BP 04/11/18 1447 (!) 160/75     Pulse Rate 04/11/18 1447 69     Resp 04/11/18 1447 16     Temp 04/11/18 1447 (!) 97.4 F (36.3 C)     Temp Source 04/11/18 1447 Oral     SpO2 04/11/18 1447 97 %     Weight 04/11/18 1446 237 lb (107.5 kg)     Height 04/11/18 1446 5\' 3"  (1.6 m)     Head Circumference --      Peak Flow --      Pain Score 04/11/18 1445 5     Pain Loc --      Pain Edu? --      Excl. in [place]? --    No data found.  Updated Vital Signs BP (!) 160/75 (BP Location: Left Arm)   Pulse 69  Temp (!) 97.4 F (36.3 C) (Oral)   Resp 16   Ht 5\' 3"  (1.6 m)   Wt 237 lb (107.5 kg)   SpO2 97%   BMI 41.98 kg/m   Visual Acuity Right Eye Distance:   Left Eye Distance:   Bilateral Distance:    Right Eye Near:   Left Eye Near:    Bilateral Near:     Physical Exam Vitals signs and nursing note reviewed.  Constitutional:      General: She is not in acute distress.    Appearance: She is well-developed.  HENT:     Head: Normocephalic and atraumatic.     Nose: Nose normal.  Eyes:     Conjunctiva/sclera: Conjunctivae normal.  Neck:     Musculoskeletal: Neck supple.  Cardiovascular:     Rate and Rhythm: Normal rate and regular rhythm.     Heart sounds: No murmur.  Pulmonary:     Effort: Pulmonary effort is normal. No respiratory distress.     Breath sounds: Normal breath sounds.  Musculoskeletal: Normal range of motion.        General: Swelling, tenderness and signs of injury present. No deformity.     Comments: Mild swelling to the left thumb, mildly tender to touch. Good ROM.   No wrist pain, swelling or deformity.   Skin:    General: Skin is warm  and dry.  Neurological:     Mental Status: She is alert.      UC Treatments / Results  Labs (all labs ordered are listed, but only abnormal results are displayed) Labs Reviewed - No data to display  EKG None  Radiology Dg Wrist Complete Left  Result Date: 04/11/2018 CLINICAL DATA:  Patient complains of base of left thumb pain and left wrist pain after a fall last night. EXAM: LEFT WRIST - COMPLETE 3+ VIEW COMPARISON:  None. FINDINGS: There is no evidence of fracture or dislocation. There is no evidence of arthropathy or other focal bone abnormality. Soft tissues are unremarkable. IMPRESSION: Negative. Electronically Signed   By: Nolon Nations M.D.   On: 04/11/2018 15:11    Procedures Procedures (including critical care time)  Medications Ordered in UC Medications - No data to display  Initial Impression / Assessment and Plan / UC Course  I have reviewed the triage vital signs and the nursing notes.  Pertinent labs & imaging results that were available during my care of the patient were reviewed by me and considered in my medical decision making (see chart for details).     X ray negative for any fractures Will place her in a thumb spica and have her take ibuprofen as needed.  Rest, ice, elevate.  Follow up as needed for continued or worsening symptoms  Final Clinical Impressions(s) / UC Diagnoses   Final diagnoses:  Thumb pain, left     Discharge Instructions     The x-ray was negative for any fractures of your wrist or hand We will put a splint on the hand to help stabilize the thumb. You can do ice a few times a day for 10 to 15 minutes Ibuprofen as needed for pain inflammation Follow up as needed for continued or worsening symptoms     ED Prescriptions    None     Controlled Substance Prescriptions [place] Controlled Substance Registry consulted? no   Orvan July, NP 04/12/18 [phone]

## 2018-04-11 NOTE — ED Triage Notes (Signed)
Per pt she feel and caught herself on the left hand. Complaining of left thumb pain and wrist pain.

## 2018-04-16 ENCOUNTER — Ambulatory Visit: Payer: Medicare Other | Admitting: Psychology

## 2018-04-20 ENCOUNTER — Ambulatory Visit: Payer: Medicare Other | Admitting: Psychology

## 2018-04-23 ENCOUNTER — Ambulatory Visit: Payer: Medicare Other | Admitting: Psychology

## 2018-11-28 IMAGING — CT CT MAXILLOFACIAL W/O CM
3 series · 16 of 47 positions shown, 19 images · non-contrast
Comparison: None.

CLINICAL DATA: Patient fell out of bed yesterday morning.
Dizziness. Left periorbital swelling and tenderness of the left
cheek.

EXAM:
CT MAXILLOFACIAL WITHOUT CONTRAST
TECHNIQUE: Multidetector CT imaging of the maxillofacial structures was
performed. Multiplanar CT image reconstructions were also generated.

[Series 3: facial st · axial · 0.32mm/px · z∈[-196,-58]mm · 10 of 81 slices shown, 13 images]
[im 6/81  brain]
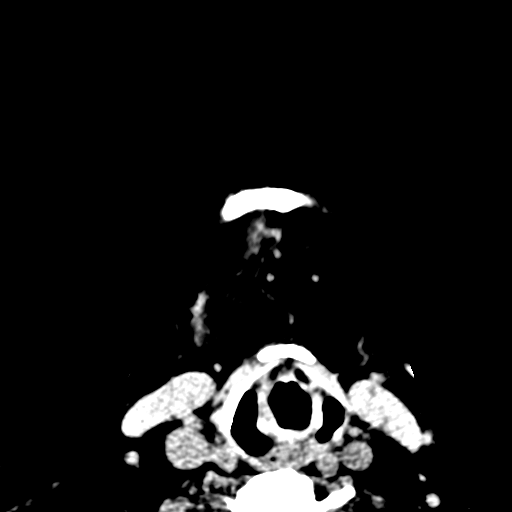
[im 6/81  bone]
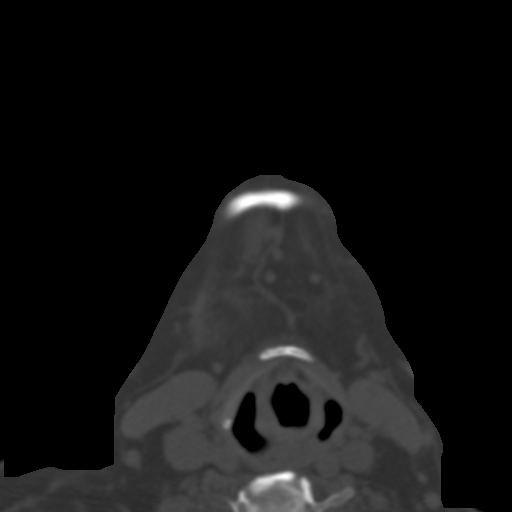
[im 14/81  bone]
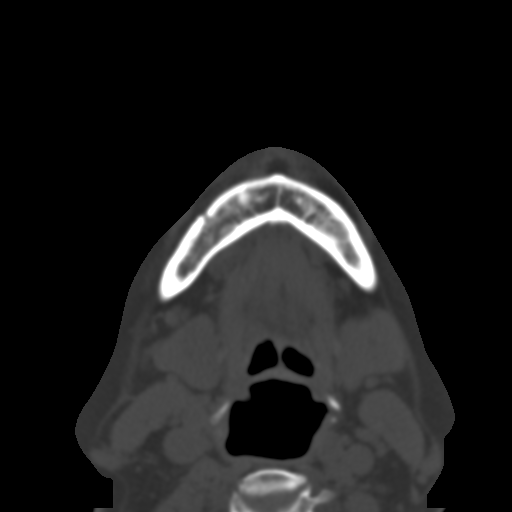
[im 23/81  bone]
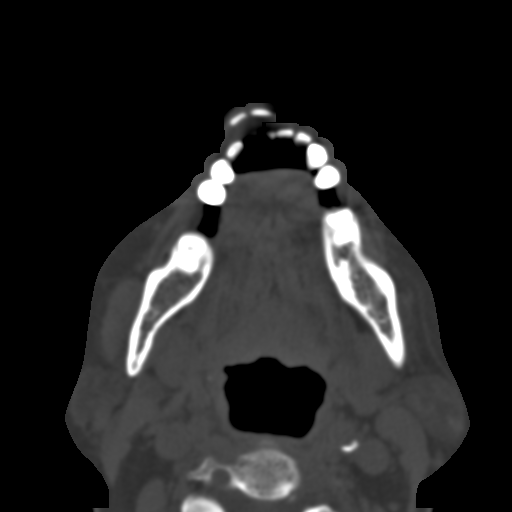
[im 28/81  bone]
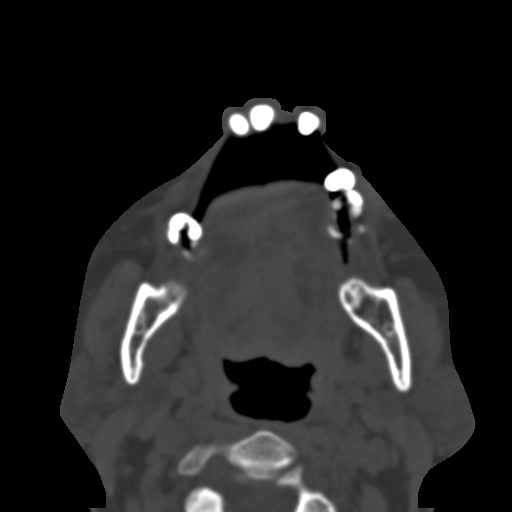
[im 36/81  brain]
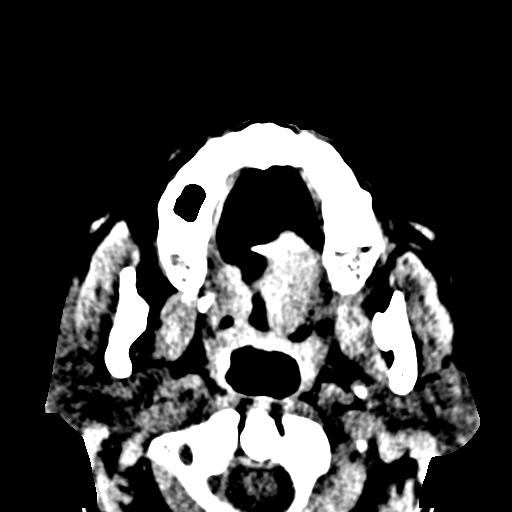
[im 36/81  bone]
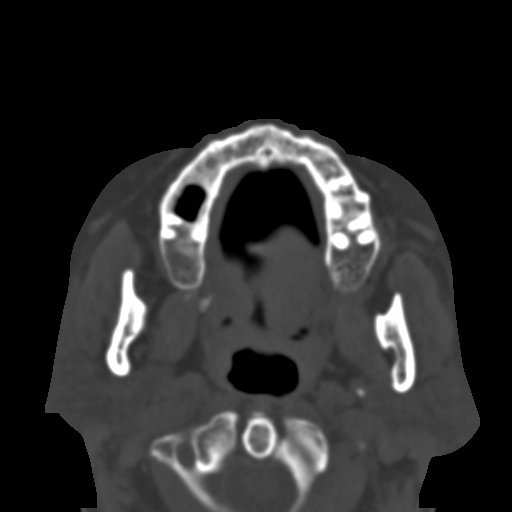
[im 45/81  bone]
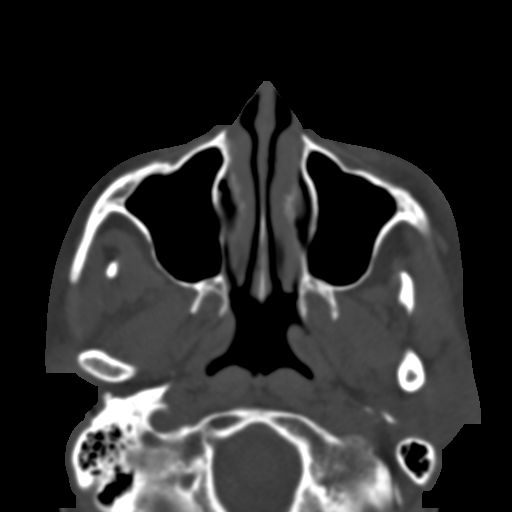
[im 53/81  bone]
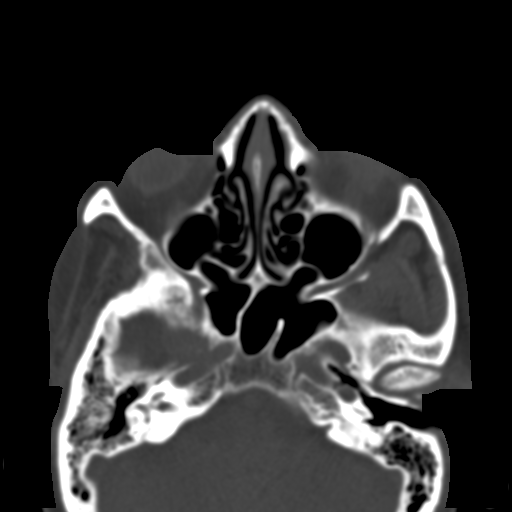
[im 61/81  bone]
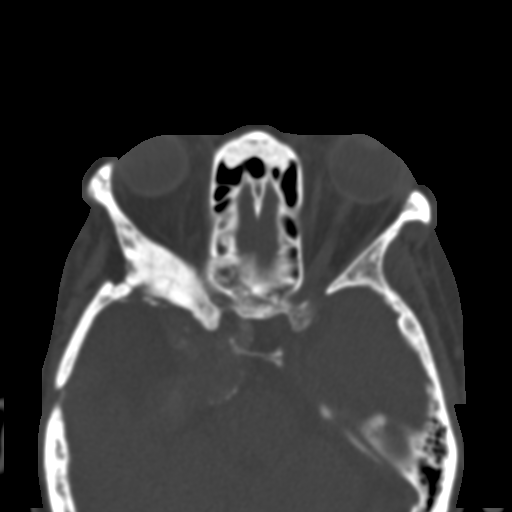
[im 67/81  brain]
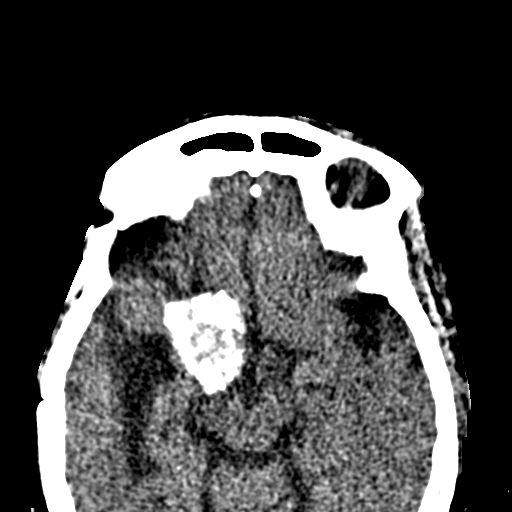
[im 67/81  bone]
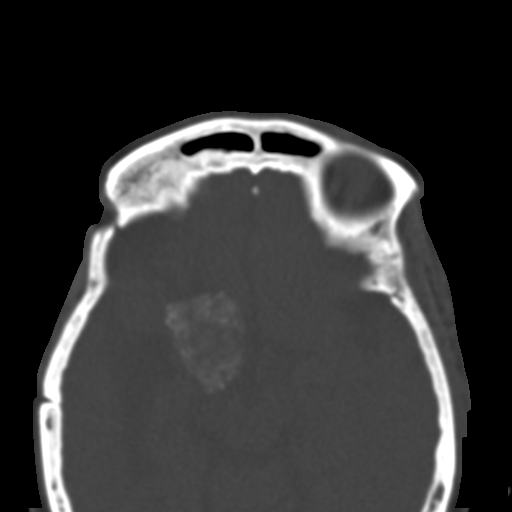
[im 75/81  bone]
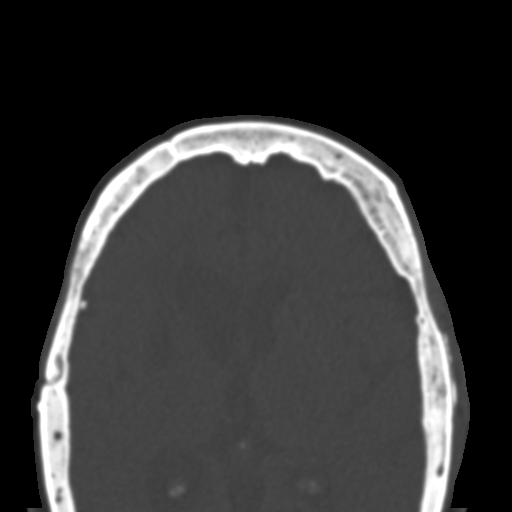

[Series 5: coronal st · coronal · 0.30mm/px · 3 of 63 slices shown]
[im 21/63  bone]
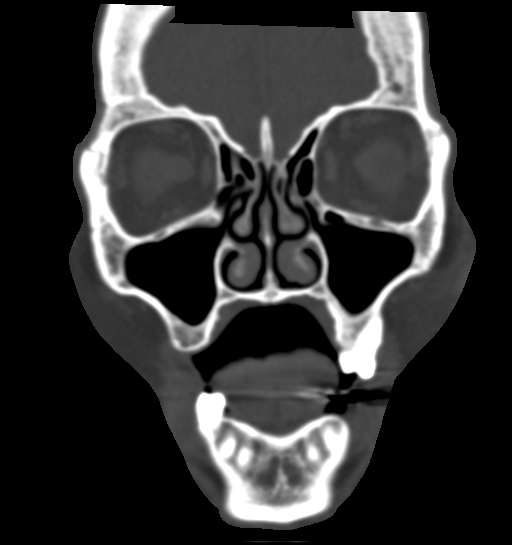
[im 28/63  bone]
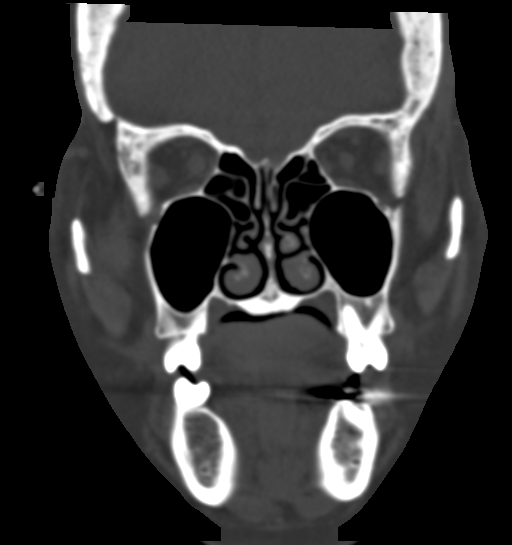
[im 35/63  bone]
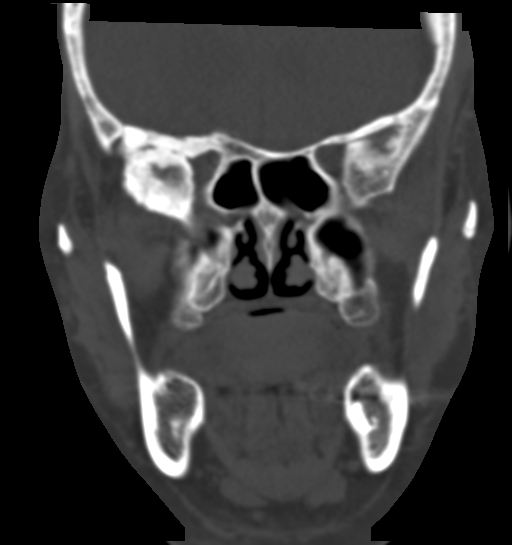

[Series 6: sagittal st · sagittal · 0.29mm/px · 3 of 71 slices shown]
[im 24/71  bone]
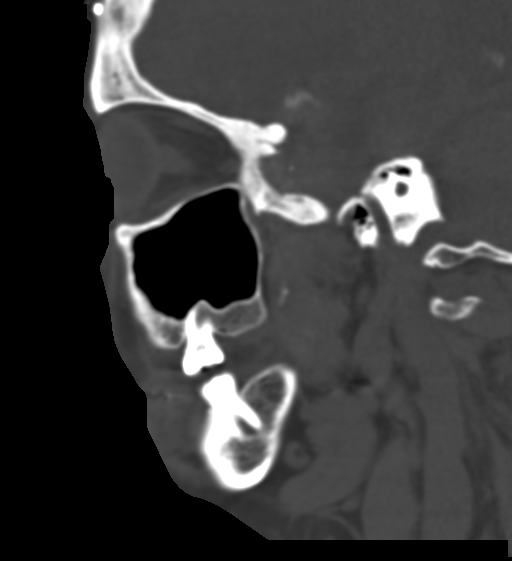
[im 36/71  bone]
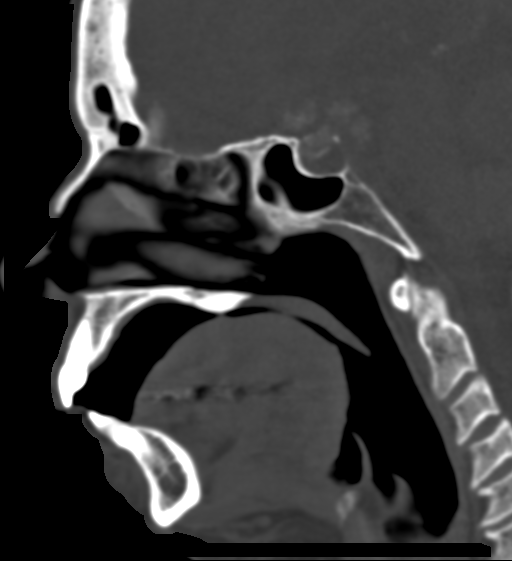
[im 47/71  bone]
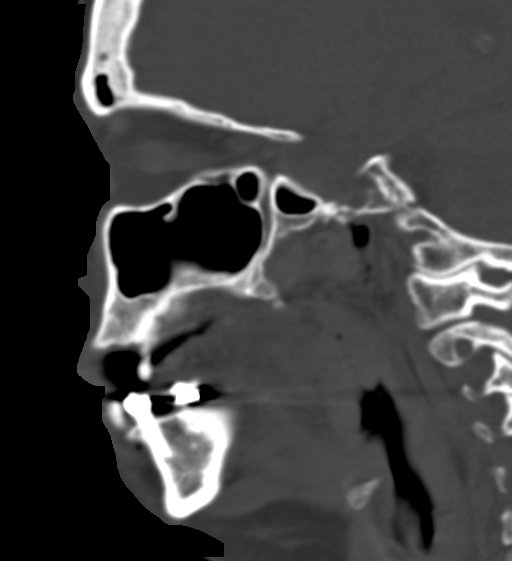

[16 of 47 positions shown; findings below may reference images not displayed]

FINDINGS: Osseous: No acute maxillofacial fracture. Evidence of prior right
temporal craniotomy.

Orbits: Evidence of prior right lens surgery. Intact orbits and
globes. No retrobulbar hematoma. Extraocular muscles and optic
nerves are unremarkable and symmetric in appearance.

Sinuses: No acute sinus disease.  Clear mastoids.

Soft tissues: Left periorbital and malar soft tissue swelling
without hematoma.

Limited intracranial: Sphenoid wing calcified meningioma is
unchanged measuring up to 3 point 6 cm AP x 2.8 cm transverse on
current exam. Adjacent encephalomalacia involving the right
frontotemporal lobe is stable.
IMPRESSION: 1. Left periorbital and malar soft tissue swelling without acute
maxillofacial fracture.
2. Chronic stable calcified sphenoid wing meningioma on the right
with adjacent encephalomalacia involving the right frontotemporal
lobes.
3. Status post right temporal craniotomy.

## 2019-01-21 ENCOUNTER — Other Ambulatory Visit (HOSPITAL_COMMUNITY): Payer: Self-pay | Admitting: Family Medicine

## 2019-01-21 DIAGNOSIS — M7989 Other specified soft tissue disorders: Secondary | ICD-10-CM

## 2019-01-21 DIAGNOSIS — M79605 Pain in left leg: Secondary | ICD-10-CM

## 2019-01-22 ENCOUNTER — Ambulatory Visit (HOSPITAL_COMMUNITY): Admission: RE | Admit: 2019-01-22 | Payer: Medicare Other | Source: Ambulatory Visit

## 2019-02-05 ENCOUNTER — Ambulatory Visit (HOSPITAL_COMMUNITY)
Admission: RE | Admit: 2019-02-05 | Discharge: 2019-02-05 | Disposition: A | Discharge status: Home / Self Care | Payer: Medicare Other | Source: Ambulatory Visit | Attending: Family Medicine | Admitting: Family Medicine

## 2019-02-05 ENCOUNTER — Other Ambulatory Visit: Payer: Self-pay

## 2019-02-05 DIAGNOSIS — M7989 Other specified soft tissue disorders: Secondary | ICD-10-CM

## 2019-02-05 DIAGNOSIS — M79605 Pain in left leg: Secondary | ICD-10-CM | POA: Insufficient documentation

## 2019-02-05 NOTE — Progress Notes (Signed)
LLE venous duplex       has been completed. Preliminary results can be found under CV proc through chart review. June Leap, BS, RDMS, RVT  Called results to [facility]

## 2019-05-17 DIAGNOSIS — G4733 Obstructive sleep apnea (adult) (pediatric): Secondary | ICD-10-CM | POA: Diagnosis not present

## 2019-05-17 DIAGNOSIS — G40009 Localization-related (focal) (partial) idiopathic epilepsy and epileptic syndromes with seizures of localized onset, not intractable, without status epilepticus: Secondary | ICD-10-CM | POA: Diagnosis not present

## 2019-05-17 DIAGNOSIS — F1511 Other stimulant abuse, in remission: Secondary | ICD-10-CM | POA: Diagnosis not present

## 2019-05-17 DIAGNOSIS — Z79899 Other long term (current) drug therapy: Secondary | ICD-10-CM | POA: Diagnosis not present

## 2019-05-17 DIAGNOSIS — D320 Benign neoplasm of cerebral meninges: Secondary | ICD-10-CM | POA: Diagnosis not present

## 2019-07-07 DIAGNOSIS — M130 Polyarthritis, unspecified: Secondary | ICD-10-CM | POA: Diagnosis not present

## 2019-07-07 DIAGNOSIS — I10 Essential (primary) hypertension: Secondary | ICD-10-CM | POA: Diagnosis not present

## 2019-07-07 DIAGNOSIS — E1169 Type 2 diabetes mellitus with other specified complication: Secondary | ICD-10-CM | POA: Diagnosis not present

## 2019-07-07 DIAGNOSIS — G40009 Localization-related (focal) (partial) idiopathic epilepsy and epileptic syndromes with seizures of localized onset, not intractable, without status epilepticus: Secondary | ICD-10-CM | POA: Diagnosis not present

## 2019-10-01 DIAGNOSIS — M130 Polyarthritis, unspecified: Secondary | ICD-10-CM | POA: Diagnosis not present

## 2019-10-01 DIAGNOSIS — I10 Essential (primary) hypertension: Secondary | ICD-10-CM | POA: Diagnosis not present

## 2019-10-01 DIAGNOSIS — E785 Hyperlipidemia, unspecified: Secondary | ICD-10-CM | POA: Diagnosis not present

## 2019-10-01 DIAGNOSIS — G40001 Localization-related (focal) (partial) idiopathic epilepsy and epileptic syndromes with seizures of localized onset, not intractable, with status epilepticus: Secondary | ICD-10-CM | POA: Diagnosis not present

## 2019-10-07 DIAGNOSIS — I10 Essential (primary) hypertension: Secondary | ICD-10-CM | POA: Diagnosis not present

## 2019-10-07 DIAGNOSIS — E1169 Type 2 diabetes mellitus with other specified complication: Secondary | ICD-10-CM | POA: Diagnosis not present

## 2019-10-07 DIAGNOSIS — E6609 Other obesity due to excess calories: Secondary | ICD-10-CM | POA: Diagnosis not present

## 2019-10-14 DIAGNOSIS — R634 Abnormal weight loss: Secondary | ICD-10-CM | POA: Diagnosis not present

## 2019-10-14 DIAGNOSIS — G40001 Localization-related (focal) (partial) idiopathic epilepsy and epileptic syndromes with seizures of localized onset, not intractable, with status epilepticus: Secondary | ICD-10-CM | POA: Diagnosis not present

## 2019-10-14 DIAGNOSIS — I10 Essential (primary) hypertension: Secondary | ICD-10-CM | POA: Diagnosis not present

## 2019-10-14 DIAGNOSIS — E785 Hyperlipidemia, unspecified: Secondary | ICD-10-CM | POA: Diagnosis not present

## 2019-10-14 DIAGNOSIS — E1169 Type 2 diabetes mellitus with other specified complication: Secondary | ICD-10-CM | POA: Diagnosis not present

## 2019-10-14 DIAGNOSIS — E6609 Other obesity due to excess calories: Secondary | ICD-10-CM | POA: Diagnosis not present

## 2019-11-02 DIAGNOSIS — E785 Hyperlipidemia, unspecified: Secondary | ICD-10-CM | POA: Diagnosis not present

## 2019-11-02 DIAGNOSIS — G40001 Localization-related (focal) (partial) idiopathic epilepsy and epileptic syndromes with seizures of localized onset, not intractable, with status epilepticus: Secondary | ICD-10-CM | POA: Diagnosis not present

## 2019-11-02 DIAGNOSIS — I10 Essential (primary) hypertension: Secondary | ICD-10-CM | POA: Diagnosis not present

## 2019-11-02 DIAGNOSIS — M130 Polyarthritis, unspecified: Secondary | ICD-10-CM | POA: Diagnosis not present

## 2019-12-02 DIAGNOSIS — M130 Polyarthritis, unspecified: Secondary | ICD-10-CM | POA: Diagnosis not present

## 2019-12-02 DIAGNOSIS — E785 Hyperlipidemia, unspecified: Secondary | ICD-10-CM | POA: Diagnosis not present

## 2019-12-02 DIAGNOSIS — G40001 Localization-related (focal) (partial) idiopathic epilepsy and epileptic syndromes with seizures of localized onset, not intractable, with status epilepticus: Secondary | ICD-10-CM | POA: Diagnosis not present

## 2019-12-02 DIAGNOSIS — I10 Essential (primary) hypertension: Secondary | ICD-10-CM | POA: Diagnosis not present

## 2020-01-01 DIAGNOSIS — E785 Hyperlipidemia, unspecified: Secondary | ICD-10-CM | POA: Diagnosis not present

## 2020-01-01 DIAGNOSIS — G40001 Localization-related (focal) (partial) idiopathic epilepsy and epileptic syndromes with seizures of localized onset, not intractable, with status epilepticus: Secondary | ICD-10-CM | POA: Diagnosis not present

## 2020-01-01 DIAGNOSIS — I10 Essential (primary) hypertension: Secondary | ICD-10-CM | POA: Diagnosis not present

## 2020-01-01 DIAGNOSIS — M130 Polyarthritis, unspecified: Secondary | ICD-10-CM | POA: Diagnosis not present

## 2020-01-02 DIAGNOSIS — G40001 Localization-related (focal) (partial) idiopathic epilepsy and epileptic syndromes with seizures of localized onset, not intractable, with status epilepticus: Secondary | ICD-10-CM | POA: Diagnosis not present

## 2020-01-02 DIAGNOSIS — I10 Essential (primary) hypertension: Secondary | ICD-10-CM | POA: Diagnosis not present

## 2020-01-02 DIAGNOSIS — E785 Hyperlipidemia, unspecified: Secondary | ICD-10-CM | POA: Diagnosis not present

## 2020-01-02 DIAGNOSIS — M130 Polyarthritis, unspecified: Secondary | ICD-10-CM | POA: Diagnosis not present

## 2020-01-06 DIAGNOSIS — Z23 Encounter for immunization: Secondary | ICD-10-CM | POA: Diagnosis not present

## 2020-01-06 DIAGNOSIS — G40009 Localization-related (focal) (partial) idiopathic epilepsy and epileptic syndromes with seizures of localized onset, not intractable, without status epilepticus: Secondary | ICD-10-CM | POA: Diagnosis not present

## 2020-01-06 DIAGNOSIS — I10 Essential (primary) hypertension: Secondary | ICD-10-CM | POA: Diagnosis not present

## 2020-01-14 DIAGNOSIS — F1521 Other stimulant dependence, in remission: Secondary | ICD-10-CM | POA: Diagnosis not present

## 2020-01-14 DIAGNOSIS — G4733 Obstructive sleep apnea (adult) (pediatric): Secondary | ICD-10-CM | POA: Diagnosis not present

## 2020-01-14 DIAGNOSIS — Z8669 Personal history of other diseases of the nervous system and sense organs: Secondary | ICD-10-CM | POA: Diagnosis not present

## 2020-01-14 DIAGNOSIS — Z48811 Encounter for surgical aftercare following surgery on the nervous system: Secondary | ICD-10-CM | POA: Diagnosis not present

## 2020-01-14 DIAGNOSIS — F1511 Other stimulant abuse, in remission: Secondary | ICD-10-CM | POA: Diagnosis not present

## 2020-01-14 DIAGNOSIS — D320 Benign neoplasm of cerebral meninges: Secondary | ICD-10-CM | POA: Diagnosis not present

## 2020-01-14 DIAGNOSIS — G40009 Localization-related (focal) (partial) idiopathic epilepsy and epileptic syndromes with seizures of localized onset, not intractable, without status epilepticus: Secondary | ICD-10-CM | POA: Diagnosis not present

## 2020-01-14 DIAGNOSIS — Z79899 Other long term (current) drug therapy: Secondary | ICD-10-CM | POA: Diagnosis not present

## 2020-02-01 DIAGNOSIS — I10 Essential (primary) hypertension: Secondary | ICD-10-CM | POA: Diagnosis not present

## 2020-02-01 DIAGNOSIS — M130 Polyarthritis, unspecified: Secondary | ICD-10-CM | POA: Diagnosis not present

## 2020-02-01 DIAGNOSIS — E785 Hyperlipidemia, unspecified: Secondary | ICD-10-CM | POA: Diagnosis not present

## 2020-02-01 DIAGNOSIS — G40001 Localization-related (focal) (partial) idiopathic epilepsy and epileptic syndromes with seizures of localized onset, not intractable, with status epilepticus: Secondary | ICD-10-CM | POA: Diagnosis not present

## 2020-02-14 ENCOUNTER — Telehealth: Payer: Self-pay | Admitting: Psychology

## 2020-02-14 NOTE — Telephone Encounter (Signed)
Completed - Patient had question about diagnosis of Memory loss and issues that had on attempts to get insurance policy.Reviewed the question and addressed it and had office call her with how to set up my chart so she can have access to all my reports for [employer].  Patient not diagnosed with dementia.

## 2020-03-03 DIAGNOSIS — G40001 Localization-related (focal) (partial) idiopathic epilepsy and epileptic syndromes with seizures of localized onset, not intractable, with status epilepticus: Secondary | ICD-10-CM | POA: Diagnosis not present

## 2020-03-03 DIAGNOSIS — I10 Essential (primary) hypertension: Secondary | ICD-10-CM | POA: Diagnosis not present

## 2020-03-03 DIAGNOSIS — E785 Hyperlipidemia, unspecified: Secondary | ICD-10-CM | POA: Diagnosis not present

## 2020-03-03 DIAGNOSIS — M130 Polyarthritis, unspecified: Secondary | ICD-10-CM | POA: Diagnosis not present

## 2020-03-13 DIAGNOSIS — Z1152 Encounter for screening for COVID-19: Secondary | ICD-10-CM | POA: Diagnosis not present

## 2020-04-01 DIAGNOSIS — M130 Polyarthritis, unspecified: Secondary | ICD-10-CM | POA: Diagnosis not present

## 2020-04-01 DIAGNOSIS — G40001 Localization-related (focal) (partial) idiopathic epilepsy and epileptic syndromes with seizures of localized onset, not intractable, with status epilepticus: Secondary | ICD-10-CM | POA: Diagnosis not present

## 2020-04-01 DIAGNOSIS — E785 Hyperlipidemia, unspecified: Secondary | ICD-10-CM | POA: Diagnosis not present

## 2020-04-01 DIAGNOSIS — I10 Essential (primary) hypertension: Secondary | ICD-10-CM | POA: Diagnosis not present

## 2020-04-11 DIAGNOSIS — H43393 Other vitreous opacities, bilateral: Secondary | ICD-10-CM | POA: Diagnosis not present

## 2020-04-11 DIAGNOSIS — H40033 Anatomical narrow angle, bilateral: Secondary | ICD-10-CM | POA: Diagnosis not present

## 2020-05-01 DIAGNOSIS — I10 Essential (primary) hypertension: Secondary | ICD-10-CM | POA: Diagnosis not present

## 2020-05-01 DIAGNOSIS — E785 Hyperlipidemia, unspecified: Secondary | ICD-10-CM | POA: Diagnosis not present

## 2020-06-01 DIAGNOSIS — M130 Polyarthritis, unspecified: Secondary | ICD-10-CM | POA: Diagnosis not present

## 2020-06-01 DIAGNOSIS — E785 Hyperlipidemia, unspecified: Secondary | ICD-10-CM | POA: Diagnosis not present

## 2020-06-01 DIAGNOSIS — I10 Essential (primary) hypertension: Secondary | ICD-10-CM | POA: Diagnosis not present

## 2020-07-01 DIAGNOSIS — I10 Essential (primary) hypertension: Secondary | ICD-10-CM | POA: Diagnosis not present

## 2020-07-01 DIAGNOSIS — G40001 Localization-related (focal) (partial) idiopathic epilepsy and epileptic syndromes with seizures of localized onset, not intractable, with status epilepticus: Secondary | ICD-10-CM | POA: Diagnosis not present

## 2020-07-01 DIAGNOSIS — M130 Polyarthritis, unspecified: Secondary | ICD-10-CM | POA: Diagnosis not present

## 2020-07-01 DIAGNOSIS — E785 Hyperlipidemia, unspecified: Secondary | ICD-10-CM | POA: Diagnosis not present

## 2020-07-24 DIAGNOSIS — E7800 Pure hypercholesterolemia, unspecified: Secondary | ICD-10-CM | POA: Diagnosis not present

## 2020-07-24 DIAGNOSIS — E1169 Type 2 diabetes mellitus with other specified complication: Secondary | ICD-10-CM | POA: Diagnosis not present

## 2020-07-24 DIAGNOSIS — E785 Hyperlipidemia, unspecified: Secondary | ICD-10-CM | POA: Diagnosis not present

## 2020-07-24 DIAGNOSIS — M130 Polyarthritis, unspecified: Secondary | ICD-10-CM | POA: Diagnosis not present

## 2020-07-24 DIAGNOSIS — G40001 Localization-related (focal) (partial) idiopathic epilepsy and epileptic syndromes with seizures of localized onset, not intractable, with status epilepticus: Secondary | ICD-10-CM | POA: Diagnosis not present

## 2020-08-01 DIAGNOSIS — M130 Polyarthritis, unspecified: Secondary | ICD-10-CM | POA: Diagnosis not present

## 2020-08-01 DIAGNOSIS — E785 Hyperlipidemia, unspecified: Secondary | ICD-10-CM | POA: Diagnosis not present

## 2020-08-01 DIAGNOSIS — I10 Essential (primary) hypertension: Secondary | ICD-10-CM | POA: Diagnosis not present

## 2020-08-04 DIAGNOSIS — D320 Benign neoplasm of cerebral meninges: Secondary | ICD-10-CM | POA: Diagnosis not present

## 2020-08-04 DIAGNOSIS — Z79899 Other long term (current) drug therapy: Secondary | ICD-10-CM | POA: Diagnosis not present

## 2020-08-04 DIAGNOSIS — G40009 Localization-related (focal) (partial) idiopathic epilepsy and epileptic syndromes with seizures of localized onset, not intractable, without status epilepticus: Secondary | ICD-10-CM | POA: Diagnosis not present

## 2020-08-31 DIAGNOSIS — E785 Hyperlipidemia, unspecified: Secondary | ICD-10-CM | POA: Diagnosis not present

## 2020-08-31 DIAGNOSIS — I10 Essential (primary) hypertension: Secondary | ICD-10-CM | POA: Diagnosis not present

## 2020-08-31 DIAGNOSIS — M130 Polyarthritis, unspecified: Secondary | ICD-10-CM | POA: Diagnosis not present

## 2020-10-01 DIAGNOSIS — I10 Essential (primary) hypertension: Secondary | ICD-10-CM | POA: Diagnosis not present

## 2020-10-01 DIAGNOSIS — E785 Hyperlipidemia, unspecified: Secondary | ICD-10-CM | POA: Diagnosis not present

## 2020-10-01 DIAGNOSIS — M130 Polyarthritis, unspecified: Secondary | ICD-10-CM | POA: Diagnosis not present

## 2020-11-01 DIAGNOSIS — E785 Hyperlipidemia, unspecified: Secondary | ICD-10-CM | POA: Diagnosis not present

## 2020-11-01 DIAGNOSIS — I10 Essential (primary) hypertension: Secondary | ICD-10-CM | POA: Diagnosis not present

## 2020-11-01 DIAGNOSIS — M130 Polyarthritis, unspecified: Secondary | ICD-10-CM | POA: Diagnosis not present

## 2020-11-27 DIAGNOSIS — M130 Polyarthritis, unspecified: Secondary | ICD-10-CM | POA: Diagnosis not present

## 2020-11-27 DIAGNOSIS — E785 Hyperlipidemia, unspecified: Secondary | ICD-10-CM | POA: Diagnosis not present

## 2020-11-27 DIAGNOSIS — E7800 Pure hypercholesterolemia, unspecified: Secondary | ICD-10-CM | POA: Diagnosis not present

## 2020-11-27 DIAGNOSIS — E6609 Other obesity due to excess calories: Secondary | ICD-10-CM | POA: Diagnosis not present

## 2020-11-27 DIAGNOSIS — G40001 Localization-related (focal) (partial) idiopathic epilepsy and epileptic syndromes with seizures of localized onset, not intractable, with status epilepticus: Secondary | ICD-10-CM | POA: Diagnosis not present

## 2020-11-27 DIAGNOSIS — I10 Essential (primary) hypertension: Secondary | ICD-10-CM | POA: Diagnosis not present

## 2020-11-27 DIAGNOSIS — G40009 Localization-related (focal) (partial) idiopathic epilepsy and epileptic syndromes with seizures of localized onset, not intractable, without status epilepticus: Secondary | ICD-10-CM | POA: Diagnosis not present

## 2020-11-27 DIAGNOSIS — E782 Mixed hyperlipidemia: Secondary | ICD-10-CM | POA: Diagnosis not present

## 2020-11-27 DIAGNOSIS — E1169 Type 2 diabetes mellitus with other specified complication: Secondary | ICD-10-CM | POA: Diagnosis not present

## 2020-11-27 DIAGNOSIS — E119 Type 2 diabetes mellitus without complications: Secondary | ICD-10-CM | POA: Diagnosis not present

## 2020-11-27 DIAGNOSIS — E663 Overweight: Secondary | ICD-10-CM | POA: Diagnosis not present

## 2020-12-01 DIAGNOSIS — I10 Essential (primary) hypertension: Secondary | ICD-10-CM | POA: Diagnosis not present

## 2020-12-01 DIAGNOSIS — M130 Polyarthritis, unspecified: Secondary | ICD-10-CM | POA: Diagnosis not present

## 2020-12-01 DIAGNOSIS — E785 Hyperlipidemia, unspecified: Secondary | ICD-10-CM | POA: Diagnosis not present

## 2020-12-10 DIAGNOSIS — G4733 Obstructive sleep apnea (adult) (pediatric): Secondary | ICD-10-CM | POA: Diagnosis not present

## 2021-01-01 DIAGNOSIS — E785 Hyperlipidemia, unspecified: Secondary | ICD-10-CM | POA: Diagnosis not present

## 2021-01-01 DIAGNOSIS — M130 Polyarthritis, unspecified: Secondary | ICD-10-CM | POA: Diagnosis not present

## 2021-01-01 DIAGNOSIS — I10 Essential (primary) hypertension: Secondary | ICD-10-CM | POA: Diagnosis not present

## 2021-01-31 DIAGNOSIS — I10 Essential (primary) hypertension: Secondary | ICD-10-CM | POA: Diagnosis not present

## 2021-01-31 DIAGNOSIS — M130 Polyarthritis, unspecified: Secondary | ICD-10-CM | POA: Diagnosis not present

## 2021-01-31 DIAGNOSIS — E785 Hyperlipidemia, unspecified: Secondary | ICD-10-CM | POA: Diagnosis not present

## 2021-03-02 DIAGNOSIS — E785 Hyperlipidemia, unspecified: Secondary | ICD-10-CM | POA: Diagnosis not present

## 2021-03-02 DIAGNOSIS — I10 Essential (primary) hypertension: Secondary | ICD-10-CM | POA: Diagnosis not present

## 2021-03-02 DIAGNOSIS — M130 Polyarthritis, unspecified: Secondary | ICD-10-CM | POA: Diagnosis not present

## 2021-03-06 DIAGNOSIS — Z20822 Contact with and (suspected) exposure to covid-19: Secondary | ICD-10-CM | POA: Diagnosis not present

## 2021-03-06 DIAGNOSIS — I10 Essential (primary) hypertension: Secondary | ICD-10-CM | POA: Diagnosis not present

## 2021-03-06 DIAGNOSIS — M130 Polyarthritis, unspecified: Secondary | ICD-10-CM | POA: Diagnosis not present

## 2021-03-06 DIAGNOSIS — E7800 Pure hypercholesterolemia, unspecified: Secondary | ICD-10-CM | POA: Diagnosis not present

## 2021-03-21 DIAGNOSIS — G4730 Sleep apnea, unspecified: Secondary | ICD-10-CM | POA: Diagnosis not present

## 2021-03-21 DIAGNOSIS — E119 Type 2 diabetes mellitus without complications: Secondary | ICD-10-CM | POA: Diagnosis not present

## 2021-03-21 DIAGNOSIS — E6609 Other obesity due to excess calories: Secondary | ICD-10-CM | POA: Diagnosis not present

## 2021-03-21 DIAGNOSIS — I10 Essential (primary) hypertension: Secondary | ICD-10-CM | POA: Diagnosis not present

## 2021-03-21 DIAGNOSIS — G40009 Localization-related (focal) (partial) idiopathic epilepsy and epileptic syndromes with seizures of localized onset, not intractable, without status epilepticus: Secondary | ICD-10-CM | POA: Diagnosis not present

## 2021-03-21 DIAGNOSIS — M1711 Unilateral primary osteoarthritis, right knee: Secondary | ICD-10-CM | POA: Diagnosis not present

## 2021-04-01 DIAGNOSIS — E785 Hyperlipidemia, unspecified: Secondary | ICD-10-CM | POA: Diagnosis not present

## 2021-04-01 DIAGNOSIS — M130 Polyarthritis, unspecified: Secondary | ICD-10-CM | POA: Diagnosis not present

## 2021-04-01 DIAGNOSIS — I10 Essential (primary) hypertension: Secondary | ICD-10-CM | POA: Diagnosis not present

## 2021-04-10 DIAGNOSIS — H43393 Other vitreous opacities, bilateral: Secondary | ICD-10-CM | POA: Diagnosis not present

## 2021-04-10 DIAGNOSIS — H40033 Anatomical narrow angle, bilateral: Secondary | ICD-10-CM | POA: Diagnosis not present

## 2021-04-27 DIAGNOSIS — M25561 Pain in right knee: Secondary | ICD-10-CM | POA: Diagnosis not present

## 2021-05-01 DIAGNOSIS — I10 Essential (primary) hypertension: Secondary | ICD-10-CM | POA: Diagnosis not present

## 2021-05-01 DIAGNOSIS — M130 Polyarthritis, unspecified: Secondary | ICD-10-CM | POA: Diagnosis not present

## 2021-05-01 DIAGNOSIS — E785 Hyperlipidemia, unspecified: Secondary | ICD-10-CM | POA: Diagnosis not present

## 2021-05-03 DIAGNOSIS — M25561 Pain in right knee: Secondary | ICD-10-CM | POA: Diagnosis not present

## 2021-05-11 DIAGNOSIS — M25561 Pain in right knee: Secondary | ICD-10-CM | POA: Diagnosis not present

## 2021-05-15 DIAGNOSIS — M1711 Unilateral primary osteoarthritis, right knee: Secondary | ICD-10-CM | POA: Diagnosis not present

## 2021-05-24 DIAGNOSIS — M1711 Unilateral primary osteoarthritis, right knee: Secondary | ICD-10-CM | POA: Diagnosis not present

## 2021-05-31 DIAGNOSIS — G40009 Localization-related (focal) (partial) idiopathic epilepsy and epileptic syndromes with seizures of localized onset, not intractable, without status epilepticus: Secondary | ICD-10-CM | POA: Diagnosis not present

## 2021-05-31 DIAGNOSIS — M130 Polyarthritis, unspecified: Secondary | ICD-10-CM | POA: Diagnosis not present

## 2021-05-31 DIAGNOSIS — E785 Hyperlipidemia, unspecified: Secondary | ICD-10-CM | POA: Diagnosis not present

## 2021-05-31 DIAGNOSIS — I10 Essential (primary) hypertension: Secondary | ICD-10-CM | POA: Diagnosis not present

## 2021-05-31 DIAGNOSIS — M1711 Unilateral primary osteoarthritis, right knee: Secondary | ICD-10-CM | POA: Diagnosis not present

## 2021-06-01 DIAGNOSIS — Z6838 Body mass index (BMI) 38.0-38.9, adult: Secondary | ICD-10-CM | POA: Diagnosis not present

## 2021-06-01 DIAGNOSIS — E785 Hyperlipidemia, unspecified: Secondary | ICD-10-CM | POA: Diagnosis not present

## 2021-06-01 DIAGNOSIS — E6609 Other obesity due to excess calories: Secondary | ICD-10-CM | POA: Diagnosis not present

## 2021-06-01 DIAGNOSIS — I10 Essential (primary) hypertension: Secondary | ICD-10-CM | POA: Diagnosis not present

## 2021-06-01 DIAGNOSIS — E1169 Type 2 diabetes mellitus with other specified complication: Secondary | ICD-10-CM | POA: Diagnosis not present

## 2021-06-01 DIAGNOSIS — M130 Polyarthritis, unspecified: Secondary | ICD-10-CM | POA: Diagnosis not present

## 2021-06-22 DIAGNOSIS — Z79899 Other long term (current) drug therapy: Secondary | ICD-10-CM | POA: Diagnosis not present

## 2021-06-22 DIAGNOSIS — H2512 Age-related nuclear cataract, left eye: Secondary | ICD-10-CM | POA: Diagnosis not present

## 2021-06-22 DIAGNOSIS — G40009 Localization-related (focal) (partial) idiopathic epilepsy and epileptic syndromes with seizures of localized onset, not intractable, without status epilepticus: Secondary | ICD-10-CM | POA: Diagnosis not present

## 2021-06-22 DIAGNOSIS — D320 Benign neoplasm of cerebral meninges: Secondary | ICD-10-CM | POA: Diagnosis not present

## 2021-06-22 DIAGNOSIS — Z8673 Personal history of transient ischemic attack (TIA), and cerebral infarction without residual deficits: Secondary | ICD-10-CM | POA: Diagnosis not present

## 2021-06-22 DIAGNOSIS — Z9889 Other specified postprocedural states: Secondary | ICD-10-CM | POA: Diagnosis not present

## 2021-06-22 DIAGNOSIS — H53469 Homonymous bilateral field defects, unspecified side: Secondary | ICD-10-CM | POA: Diagnosis not present

## 2021-06-22 DIAGNOSIS — G4733 Obstructive sleep apnea (adult) (pediatric): Secondary | ICD-10-CM | POA: Diagnosis not present

## 2021-08-01 DIAGNOSIS — E785 Hyperlipidemia, unspecified: Secondary | ICD-10-CM | POA: Diagnosis not present

## 2021-08-01 DIAGNOSIS — I10 Essential (primary) hypertension: Secondary | ICD-10-CM | POA: Diagnosis not present

## 2021-08-31 DIAGNOSIS — I10 Essential (primary) hypertension: Secondary | ICD-10-CM | POA: Diagnosis not present

## 2021-08-31 DIAGNOSIS — E785 Hyperlipidemia, unspecified: Secondary | ICD-10-CM | POA: Diagnosis not present

## 2021-09-10 DIAGNOSIS — Z1231 Encounter for screening mammogram for malignant neoplasm of breast: Secondary | ICD-10-CM | POA: Diagnosis not present

## 2021-10-01 DIAGNOSIS — M130 Polyarthritis, unspecified: Secondary | ICD-10-CM | POA: Diagnosis not present

## 2021-10-01 DIAGNOSIS — E785 Hyperlipidemia, unspecified: Secondary | ICD-10-CM | POA: Diagnosis not present

## 2021-10-01 DIAGNOSIS — I10 Essential (primary) hypertension: Secondary | ICD-10-CM | POA: Diagnosis not present

## 2021-10-06 ENCOUNTER — Encounter (HOSPITAL_COMMUNITY): Payer: Self-pay | Admitting: Emergency Medicine

## 2021-10-06 ENCOUNTER — Emergency Department (HOSPITAL_COMMUNITY): Payer: Medicare PPO

## 2021-10-06 ENCOUNTER — Emergency Department (HOSPITAL_COMMUNITY)
Admission: EM | Admit: 2021-10-06 | Discharge: 2021-10-06 | Disposition: A | Discharge status: Home / Self Care | Payer: Medicare PPO | Attending: Emergency Medicine | Admitting: Emergency Medicine

## 2021-10-06 ENCOUNTER — Other Ambulatory Visit: Payer: Self-pay

## 2021-10-06 DIAGNOSIS — R9431 Abnormal electrocardiogram [ECG] [EKG]: Secondary | ICD-10-CM | POA: Diagnosis not present

## 2021-10-06 DIAGNOSIS — I10 Essential (primary) hypertension: Secondary | ICD-10-CM | POA: Diagnosis not present

## 2021-10-06 DIAGNOSIS — S0990XA Unspecified injury of head, initial encounter: Secondary | ICD-10-CM | POA: Diagnosis not present

## 2021-10-06 DIAGNOSIS — Z79899 Other long term (current) drug therapy: Secondary | ICD-10-CM | POA: Diagnosis not present

## 2021-10-06 DIAGNOSIS — W19XXXA Unspecified fall, initial encounter: Secondary | ICD-10-CM

## 2021-10-06 DIAGNOSIS — S0101XA Laceration without foreign body of scalp, initial encounter: Secondary | ICD-10-CM | POA: Insufficient documentation

## 2021-10-06 DIAGNOSIS — W01198A Fall on same level from slipping, tripping and stumbling with subsequent striking against other object, initial encounter: Secondary | ICD-10-CM | POA: Insufficient documentation

## 2021-10-06 DIAGNOSIS — Z043 Encounter for examination and observation following other accident: Secondary | ICD-10-CM | POA: Diagnosis not present

## 2021-10-06 LAB — CBC
HCT: 40.8 % (ref 36.0–46.0)
Hemoglobin: 12.6 g/dL (ref 12.0–15.0)
MCH: 28.8 pg (ref 26.0–34.0)
MCHC: 30.9 g/dL (ref 30.0–36.0)
MCV: 93.4 fL (ref 80.0–100.0)
Platelets: 249 10*3/uL (ref 150–400)
RBC: 4.37 MIL/uL (ref 3.87–5.11)
RDW: 14.7 % (ref 11.5–15.5)
WBC: 5.6 10*3/uL (ref 4.0–10.5)
nRBC: 0 % (ref 0.0–0.2)

## 2021-10-06 LAB — BASIC METABOLIC PANEL
Anion gap: 7 (ref 5–15)
BUN: 18 mg/dL (ref 8–23)
CO2: 25 mmol/L (ref 22–32)
Calcium: 9.8 mg/dL (ref 8.9–10.3)
Chloride: 108 mmol/L (ref 98–111)
Creatinine, Ser: 0.86 mg/dL (ref 0.44–1.00)
GFR, Estimated: >60 mL/min (ref >60)
Glucose, Bld: 91 mg/dL (ref 70–99)
Potassium: 3.5 mmol/L (ref 3.5–5.1)
Sodium: 140 mmol/L (ref 135–145)

## 2021-10-06 MED ORDER — ACETAMINOPHEN 500 MG PO TABS
1000.0000 mg | ORAL_TABLET | Freq: Once | ORAL | Status: AC
  Administered 2021-10-06: 1000 mg via ORAL
  Filled 2021-10-06: qty 2

## 2021-10-06 MED ORDER — LIDOCAINE-EPINEPHRINE-TETRACAINE (LET) TOPICAL GEL
3.0000 mL | Freq: Once | TOPICAL | Status: AC
  Administered 2021-10-06: 3 mL via TOPICAL
  Filled 2021-10-06: qty 3

## 2021-10-06 NOTE — ED Notes (Signed)
Patient to CT scan

## 2021-10-06 NOTE — ED Provider Notes (Signed)
[place] DEPT Provider Note   CSN: [id number] Arrival date & time: 10/06/21  1837     History  Chief Complaint  Patient presents with   Lytle Michaels    Deborah Gallegos is a 71 y.o. female. 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  Patient was standing up holding a bowl when she twisted and turned and fell over a chair and hit the back of her head.  She had no loss of consciousness, no altered mental status or vomiting or severe pain since the fall.  She does have some mild pain at the site of laceration on the back of her head.  She has had no focal weakness, numbness or tingling, slurred speech or other neurologic deficits.  She denies any preceding symptoms leading up to the fall, no chest pain, no shortness of breath, no palpitations.  She has been ambulatory since the fall.  She denies any pain elsewhere since the fall.  She denies any seizures today.  Per patient, tetanus update in the past 5 years.   Fall       Home Medications Prior to Admission medications   Medication Sig Start Date End Date Taking? Authorizing Provider  amLODipine-benazepril (LOTREL) 5-20 MG capsule Take 1 capsule by mouth daily. 10/05/10   [provider]  BRIVIACT 100 MG TABS Take 1 tablet by mouth 2 (two) times daily. 10/02/16   [provider]  ibuprofen (ADVIL,MOTRIN) 400 MG tablet Take 1 tablet (400 mg total) by mouth every 6 (six) hours as needed. 04/24/17   Zigmund Gottron, NP  lamoTRIgine (LAMICTAL) 100 MG tablet Take 200-250 tablets by mouth 2 (two) times daily. 250 MG in the morning and 200 MG in the evening 10/01/16   [provider]  meclizine (ANTIVERT) 50 MG tablet Take 1 tablet (50 mg total) by mouth 3 (three) times daily as needed. 12/27/12   Tanna Furry, MD  rosuvastatin (CRESTOR) 10 MG tablet Take 10 mg by mouth daily.    [provider]      Allergies    Patient has no known allergies.    Review of Systems   Review of Systems  Physical Exam Updated Vital Signs BP (!) 139/93   Pulse 70   Temp 98.2 F (36.8 C) (Oral)   Resp (!) 21   Ht 5' 3.5" (1.613 m)   Wt 96.2 kg   SpO2 99%   BMI 36.97 kg/m  Physical Exam Constitutional: Alert and oriented. Well appearing and in no distress. Eyes: Conjunctivae are normal. ENT      Head: Normocephalic and right posterior scalp 4 cm hemostatic linear laceration      Nose: No congestion.      Mouth/Throat: Mucous membranes are moist.      Neck: No stridor. Cardiovascular: S1, S2,  Normal and symmetric distal pulses are present in all extremities.Warm and well perfused. Respiratory: Normal respiratory effort. Breath sounds are normal. Gastrointestinal: Soft and nontender. There is no CVA tenderness. Musculoskeletal: Normal range of motion in all extremities.      Right lower leg: No tenderness or edema.      Left lower leg: No tenderness or edema. Neurologic: Normal speech and language.  Moving all extremities equally, sensation grossly intact.  No facial droop.  No gross focal neurologic deficits  are appreciated. Skin: Skin is warm, dry and intact. No rash noted. Psychiatric: Mood and affect are normal. Speech and behavior are normal.   ED Results / Procedures / Treatments   Labs (all labs ordered are listed, but only abnormal results are displayed) Labs Reviewed  BASIC METABOLIC PANEL  CBC    EKG EKG Interpretation  Date/Time:  Saturday October 06 2021 19:00:59 EDT Ventricular Rate:  60 PR Interval:    QRS Duration: 82 QT Interval:  413 QTC Calculation: 413 R Axis:   3 Text Interpretation: Atrial flutter Suspect artifact, not atrial flutter Normal sinus rhythm Confirmed by Georgina Snell ([id number]) on 10/06/2021 7:03:59 PM  Radiology CT Head Wo  Contrast  Result Date: 10/06/2021 CLINICAL DATA:  Fall, head injury EXAM: CT HEAD WITHOUT CONTRAST CT CERVICAL SPINE WITHOUT CONTRAST TECHNIQUE: Multidetector CT imaging of the head and cervical spine was performed following the standard protocol without intravenous contrast. Multiplanar CT image reconstructions of the cervical spine were also generated. RADIATION DOSE REDUCTION: This exam was performed according to the departmental dose-optimization program which includes automated exposure control, adjustment of the mA and/or kV according to patient size and/or use of iterative reconstruction technique. COMPARISON:  12/27/2012 FINDINGS: CT HEAD FINDINGS Brain: No evidence of acute infarction, hemorrhage, hydrocephalus, or extra-axial collection. Unchanged appearance of a coarsely calcified meningioma arising from the medial right middle cranial fossa (series 1, image 8). Overlying encephalomalacia of the right temporal pole and insula. New intermediate attenuation lesion at the right aspect of the anterior falx measuring 1.1 x 0.8 cm (series 1, image 10). Periventricular and deep white matter hypodensity. Vascular: No hyperdense vessel or unexpected calcification. Skull: Status post right pterional craniotomy. Negative for fracture or focal lesion. Sinuses/Orbits: No acute finding. Other: None. CT CERVICAL SPINE FINDINGS Alignment: Degenerative reversal of the normal cervical lordosis. Skull base and vertebrae: No acute fracture. No primary bone lesion or focal pathologic process. Soft tissues and spinal canal: No prevertebral fluid or swelling. No visible canal hematoma. Disc levels: Mild disc space height loss and osteophytosis at the lower cervical levels. Upper chest: Negative. Other: None. IMPRESSION: 1. No acute intracranial pathology. 2. Unchanged appearance of a coarsely calcified meningioma arising from the medial right middle cranial fossa with overlying encephalomalacia of the right temporal pole and  insula likely related to prior resection. 3. New intermediate attenuation lesion at the right aspect of the anterior falx measuring 1.1 x 0.8 cm. This may reflect a new small meningioma or alternately an anterior cerebral artery aneurysm. Consider contrast enhanced CT or MR angiogram to further evaluate on a nonemergent basis. 4. No fracture or static subluxation of the cervical spine. Electronically Signed   By: Delanna Ahmadi M.D.   On: 10/06/2021 19:28   CT Cervical Spine Wo Contrast  Result Date: 10/06/2021 CLINICAL DATA:  Fall, head injury EXAM: CT HEAD WITHOUT CONTRAST CT CERVICAL SPINE WITHOUT CONTRAST TECHNIQUE: Multidetector CT imaging of the head and cervical spine was performed following the standard protocol without intravenous contrast. Multiplanar CT image reconstructions of the cervical spine were also generated. RADIATION DOSE REDUCTION: This exam was performed according to the departmental dose-optimization program which includes automated exposure control, adjustment of the mA and/or kV according to patient size and/or use of iterative reconstruction technique. COMPARISON:  12/27/2012 FINDINGS: CT HEAD FINDINGS Brain: No evidence of acute infarction, hemorrhage, hydrocephalus, or extra-axial collection. Unchanged appearance of a coarsely calcified meningioma arising from the medial right middle cranial fossa (series 1, image 8). Overlying encephalomalacia  of the right temporal pole and insula. New intermediate attenuation lesion at the right aspect of the anterior falx measuring 1.1 x 0.8 cm (series 1, image 10). Periventricular and deep white matter hypodensity. Vascular: No hyperdense vessel or unexpected calcification. Skull: Status post right pterional craniotomy. Negative for fracture or focal lesion. Sinuses/Orbits: No acute finding. Other: None. CT CERVICAL SPINE FINDINGS Alignment: Degenerative reversal of the normal cervical lordosis. Skull base and vertebrae: No acute fracture. No  primary bone lesion or focal pathologic process. Soft tissues and spinal canal: No prevertebral fluid or swelling. No visible canal hematoma. Disc levels: Mild disc space height loss and osteophytosis at the lower cervical levels. Upper chest: Negative. Other: None. IMPRESSION: 1. No acute intracranial pathology. 2. Unchanged appearance of a coarsely calcified meningioma arising from the medial right middle cranial fossa with overlying encephalomalacia of the right temporal pole and insula likely related to prior resection. 3. New intermediate attenuation lesion at the right aspect of the anterior falx measuring 1.1 x 0.8 cm. This may reflect a new small meningioma or alternately an anterior cerebral artery aneurysm. Consider contrast enhanced CT or MR angiogram to further evaluate on a nonemergent basis. 4. No fracture or static subluxation of the cervical spine. Electronically Signed   By: Delanna Ahmadi M.D.   On: 10/06/2021 19:28    Procedures .Marland KitchenLaceration Repair  Date/Time: 10/06/2021 8:21 PM  Performed by: Elgie Congo, MD Authorized by: Elgie Congo, MD   Consent:    Consent obtained:  Verbal   Consent given by:  Patient   Risks discussed:  Infection, pain and poor cosmetic result   Alternatives discussed:  No treatment Anesthesia:    Anesthesia method:  Topical application   Topical anesthetic:  LET Laceration details:    Location:  Scalp   Length (cm):  4 Exploration:    Imaging obtained comment:  CTH   Contaminated: no   Treatment:    Area cleansed with:  Saline   Irrigation solution:  Sterile saline   Irrigation method:  Syringe   Visualized foreign bodies/material removed: no   Skin repair:    Repair method:  Staples   Number of staples:  4 Approximation:    Approximation:  Close Repair type:    Repair type:  Simple Post-procedure details:    Dressing:  Open (no dressing)   Procedure completion:  Tolerated well, no immediate complications    Medications  Ordered in ED Medications  acetaminophen (TYLENOL) tablet 1,000 mg (has no administration in time range)  lidocaine-EPINEPHrine-tetracaine (LET) topical gel (3 mLs Topical Given 10/06/21 1922)    ED Course/ Medical Decision Making/ A&P                           Medical Decision Making STARLEE CORRALEJO is a 71 y.o. female. 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  Patient had mechanical fall with no preceding red flag signs or symptoms.  Resulting head laceration, tetanus up-to-date.  She had basic labs obtained in triage which were unremarkable with no anemia or acute electrolyte abnormalities.  She had an EKG which was normal sinus rhythm with no ischemic changes.  CT head obtained with no evidence of ICH and CT C-spine with no acute traumatic injury.  However, CT head noted  to have possible new meningioma.  Patient has no focal neurologic deficits and had no seizure-like activity and is at neurologic baseline.  She had a mechanical fall with reassuring work-up, repaired head laceration with staples and advise close follow-up with PCP and neurology in outpatient setting for likely MRI for concern for possible new meningioma on CT scan needing outpatient follow up.  Return precaution discussed.  Patient and patient's family in agreement with this plan and safe for discharge with outpatient follow-up.  Amount and/or Complexity of Data Reviewed Labs: ordered. Decision-making details documented in ED Course. Radiology: ordered and independent interpretation performed.    Details: No evidence ICH on [facility] ECG/medicine tests: independent interpretation performed. Decision-making details documented in ED Course.  Risk OTC drugs.     Final Clinical Impression(s) / ED Diagnoses Final diagnoses:  Fall,  initial encounter  Laceration of scalp without foreign body, initial encounter    Rx / DC Orders ED Discharge Orders     None         Elgie Congo, MD 10/06/21 2022

## 2021-10-06 NOTE — Discharge Instructions (Addendum)
You have been seen in the Emergency Department (ED) today following a fall.  Your workup today did not reveal any injuries that require you to stay in the hospital.   You can expect to be stiff and sore for the next several days.  Please take Tylenol and use ice packs as needed for pain, but only as written on the box.  You had staples today for the laceration or cut of your head.  Return to your own doctor, urgent care or the ER in 5 to 7 days for removal of the staples.  Monitor for signs of infection such as pus draining from the wound, spreading redness, high fevers.  Please follow up with your primary care doctor as soon as possible regarding today's ED visit and your recent accident.  As we discussed, follow-up with your neurologist as there were findings on your CT scan that could suggest a new meningioma.  You will likely need more imaging in the outpatient setting such as MRI.   Call your doctor or return to the ED if you develop a sudden or severe headache, confusion, slurred speech, facial droop, weakness or numbness in any arm or leg,  extreme fatigue, vomiting more than two times, severe abdominal pain, difficulty breathing or any other concerning signs or symptoms.

## 2021-10-06 NOTE — ED Triage Notes (Signed)
Via EMS from home after sustaining mechanical fall with head injury. No blood thinners. 2-3 inch laceration to posterior head, bleeding controlled. No LOC, A/O x 4. Pt denies additional injury. She was able to walk with her walker to the neighbor's house to get help after her fall.

## 2021-10-06 NOTE — ED Notes (Signed)
Patient given discharge instructions, all questions answered. Patient in possession of all belongings, directed to the discharge area  

## 2021-10-11 ENCOUNTER — Ambulatory Visit
Admission: EM | Admit: 2021-10-11 | Discharge: 2021-10-11 | Disposition: A | Discharge status: Home / Self Care | Payer: Medicare PPO

## 2021-10-11 NOTE — ED Notes (Signed)
4 staples removed from posterior head.

## 2021-10-11 NOTE — ED Triage Notes (Signed)
Pt presents with staple removal to posterior head.

## 2021-10-19 DIAGNOSIS — E785 Hyperlipidemia, unspecified: Secondary | ICD-10-CM | POA: Diagnosis not present

## 2021-10-19 DIAGNOSIS — G40009 Localization-related (focal) (partial) idiopathic epilepsy and epileptic syndromes with seizures of localized onset, not intractable, without status epilepticus: Secondary | ICD-10-CM | POA: Diagnosis not present

## 2021-10-19 DIAGNOSIS — E1169 Type 2 diabetes mellitus with other specified complication: Secondary | ICD-10-CM | POA: Diagnosis not present

## 2021-10-19 DIAGNOSIS — Z6839 Body mass index (BMI) 39.0-39.9, adult: Secondary | ICD-10-CM | POA: Diagnosis not present

## 2021-10-19 DIAGNOSIS — E661 Drug-induced obesity: Secondary | ICD-10-CM | POA: Diagnosis not present

## 2021-10-19 DIAGNOSIS — R609 Edema, unspecified: Secondary | ICD-10-CM | POA: Diagnosis not present

## 2021-11-01 DIAGNOSIS — M130 Polyarthritis, unspecified: Secondary | ICD-10-CM | POA: Diagnosis not present

## 2021-11-01 DIAGNOSIS — I10 Essential (primary) hypertension: Secondary | ICD-10-CM | POA: Diagnosis not present

## 2021-11-01 DIAGNOSIS — E785 Hyperlipidemia, unspecified: Secondary | ICD-10-CM | POA: Diagnosis not present

## 2021-11-27 DIAGNOSIS — G40009 Localization-related (focal) (partial) idiopathic epilepsy and epileptic syndromes with seizures of localized onset, not intractable, without status epilepticus: Secondary | ICD-10-CM | POA: Diagnosis not present

## 2021-11-27 DIAGNOSIS — R609 Edema, unspecified: Secondary | ICD-10-CM | POA: Diagnosis not present

## 2021-11-27 DIAGNOSIS — I10 Essential (primary) hypertension: Secondary | ICD-10-CM | POA: Diagnosis not present

## 2021-12-01 DIAGNOSIS — I10 Essential (primary) hypertension: Secondary | ICD-10-CM | POA: Diagnosis not present

## 2021-12-01 DIAGNOSIS — E785 Hyperlipidemia, unspecified: Secondary | ICD-10-CM | POA: Diagnosis not present

## 2021-12-01 DIAGNOSIS — M130 Polyarthritis, unspecified: Secondary | ICD-10-CM | POA: Diagnosis not present

## 2021-12-06 DIAGNOSIS — D320 Benign neoplasm of cerebral meninges: Secondary | ICD-10-CM | POA: Diagnosis not present

## 2021-12-06 DIAGNOSIS — G40009 Localization-related (focal) (partial) idiopathic epilepsy and epileptic syndromes with seizures of localized onset, not intractable, without status epilepticus: Secondary | ICD-10-CM | POA: Diagnosis not present

## 2021-12-06 DIAGNOSIS — Z9889 Other specified postprocedural states: Secondary | ICD-10-CM | POA: Diagnosis not present

## 2021-12-06 DIAGNOSIS — Z79899 Other long term (current) drug therapy: Secondary | ICD-10-CM | POA: Diagnosis not present

## 2021-12-06 DIAGNOSIS — G40109 Localization-related (focal) (partial) symptomatic epilepsy and epileptic syndromes with simple partial seizures, not intractable, without status epilepticus: Secondary | ICD-10-CM | POA: Diagnosis not present

## 2021-12-06 DIAGNOSIS — Z923 Personal history of irradiation: Secondary | ICD-10-CM | POA: Diagnosis not present

## 2022-01-01 DIAGNOSIS — E785 Hyperlipidemia, unspecified: Secondary | ICD-10-CM | POA: Diagnosis not present

## 2022-01-01 DIAGNOSIS — M130 Polyarthritis, unspecified: Secondary | ICD-10-CM | POA: Diagnosis not present

## 2022-01-01 DIAGNOSIS — I10 Essential (primary) hypertension: Secondary | ICD-10-CM | POA: Diagnosis not present

## 2022-01-07 DIAGNOSIS — Z23 Encounter for immunization: Secondary | ICD-10-CM | POA: Diagnosis not present

## 2022-01-07 DIAGNOSIS — I10 Essential (primary) hypertension: Secondary | ICD-10-CM | POA: Diagnosis not present

## 2022-01-31 DIAGNOSIS — E785 Hyperlipidemia, unspecified: Secondary | ICD-10-CM | POA: Diagnosis not present

## 2022-01-31 DIAGNOSIS — I10 Essential (primary) hypertension: Secondary | ICD-10-CM | POA: Diagnosis not present

## 2022-03-02 DIAGNOSIS — E785 Hyperlipidemia, unspecified: Secondary | ICD-10-CM | POA: Diagnosis not present

## 2022-03-02 DIAGNOSIS — M130 Polyarthritis, unspecified: Secondary | ICD-10-CM | POA: Diagnosis not present

## 2022-03-02 DIAGNOSIS — I10 Essential (primary) hypertension: Secondary | ICD-10-CM | POA: Diagnosis not present

## 2022-03-29 DIAGNOSIS — G4733 Obstructive sleep apnea (adult) (pediatric): Secondary | ICD-10-CM | POA: Diagnosis not present

## 2022-04-12 DIAGNOSIS — I10 Essential (primary) hypertension: Secondary | ICD-10-CM | POA: Diagnosis not present

## 2022-04-12 DIAGNOSIS — E7800 Pure hypercholesterolemia, unspecified: Secondary | ICD-10-CM | POA: Diagnosis not present

## 2022-04-12 DIAGNOSIS — E1169 Type 2 diabetes mellitus with other specified complication: Secondary | ICD-10-CM | POA: Diagnosis not present

## 2022-04-12 DIAGNOSIS — G40009 Localization-related (focal) (partial) idiopathic epilepsy and epileptic syndromes with seizures of localized onset, not intractable, without status epilepticus: Secondary | ICD-10-CM | POA: Diagnosis not present

## 2022-04-12 DIAGNOSIS — E785 Hyperlipidemia, unspecified: Secondary | ICD-10-CM | POA: Diagnosis not present

## 2022-04-29 DIAGNOSIS — G4733 Obstructive sleep apnea (adult) (pediatric): Secondary | ICD-10-CM | POA: Diagnosis not present

## 2022-05-02 DIAGNOSIS — M130 Polyarthritis, unspecified: Secondary | ICD-10-CM | POA: Diagnosis not present

## 2022-05-02 DIAGNOSIS — I10 Essential (primary) hypertension: Secondary | ICD-10-CM | POA: Diagnosis not present

## 2022-05-02 DIAGNOSIS — E785 Hyperlipidemia, unspecified: Secondary | ICD-10-CM | POA: Diagnosis not present

## 2022-05-20 ENCOUNTER — Ambulatory Visit: Payer: Medicare PPO | Admitting: Physical Therapy

## 2022-05-23 ENCOUNTER — Encounter: Payer: Self-pay | Admitting: Physical Therapy

## 2022-05-23 ENCOUNTER — Other Ambulatory Visit: Payer: Self-pay

## 2022-05-23 ENCOUNTER — Ambulatory Visit: Payer: Medicare PPO | Attending: Family Medicine | Admitting: Physical Therapy

## 2022-05-23 DIAGNOSIS — R2689 Other abnormalities of gait and mobility: Secondary | ICD-10-CM | POA: Diagnosis not present

## 2022-05-23 DIAGNOSIS — R2681 Unsteadiness on feet: Secondary | ICD-10-CM | POA: Diagnosis not present

## 2022-05-23 DIAGNOSIS — M6281 Muscle weakness (generalized): Secondary | ICD-10-CM | POA: Insufficient documentation

## 2022-05-23 NOTE — Therapy (Signed)
OUTPATIENT PHYSICAL THERAPY NEURO EVALUATION   Patient Name: Deborah Gallegos MRN: [id number] DOB:07/17/1950, 72 y.o., female Today's Date: 05/23/2022   PCP: Lucianne Lei, MD REFERRING PROVIDER: Lucianne Lei, MD  END OF SESSION:  PT End of Session - 05/23/22 1501     Visit Number 1    Number of Visits 9   8 + eval   Date for PT Re-Evaluation 07/26/22   pushed out due to scheduling availability   Authorization Type [employer]IC[employer]    Progress Note Due on Visit 10    PT Start Time 1455   pt late and needing to use the restroom to adjust clothing prior to evaluation   PT Stop Time 1540    PT Time Calculation (min) 45 min    Equipment Utilized During Treatment Gait belt    Behavior During Therapy WFL for tasks assessed/performed             Past Medical History:  Diagnosis Date   Hypertension    Seizure [facility])    Past Surgical History:  Procedure Laterality Date   ABDOMINAL HYSTERECTOMY     BRAIN TUMOR EXCISION     CATARACT EXTRACTION     EYE SURGERY     There are no problems to display for this patient.   ONSET DATE: 05/09/2022 (referral, pt unsure how long issues have been going on)   REFERRING DIAG: R26.89 (ICD-10-CM) - Balance disorder R26.9 (ICD-10-CM) - Gait abnormality  THERAPY DIAG:  Other abnormalities of gait and mobility  Muscle weakness (generalized)  Unsteadiness on feet  Rationale for Evaluation and Treatment: Rehabilitation  SUBJECTIVE:                                                                                                                                                                                             SUBJECTIVE STATEMENT: Pt unsure of how long she has issues with her balance, thinks it's been at least a year.  She states she falls at least 2x per week.  Seizures are not always correlated to her falls.  She is having ~5 seizures per month.  Her seizure triggers are sleep deprivation and caffeine.  She is diligent keeping a  seizure calendar as well as a regular medicine regimen.  Her last seizure was this morning when she woke up.  She has an aura that begins on her right side to the back of her head prior to onset of seizure.  She states she is having partial seizures and is unable to describe what her seizures look like. Pt accompanied by: self and son drove her, not present for  session.  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 No  PRECAUTIONS: Fall  WEIGHT BEARING RESTRICTIONS: No  FALLS: Has patient fallen in last 6 months? Yes. Number of falls >10  LIVING ENVIRONMENT: Lives with: lives with their son Lives in: House/apartment Stairs: Yes: External: 2 steps; can reach both Has following equipment at home: Single point cane, Quad cane small base, Walker - 2 wheeled, Walker - 4 wheeled, shower chair, bed side commode, and Grab bars  PLOF: Independent with basic ADLs, Independent with household mobility without device, Independent with transfers, and Needs assistance with homemaking  PATIENT GOALS: "I want to be able to walk from my bathroom in my bedroom to my bed."  OBJECTIVE:   DIAGNOSTIC FINDINGS: CT Head 10/06/2021 IMPRESSION: 1. No acute intracranial pathology. 2. Unchanged appearance of a coarsely calcified meningioma arising from the medial right middle cranial fossa with overlying encephalomalacia of the right temporal pole and insula likely related to prior resection. 3. New intermediate attenuation lesion at the right aspect of the anterior falx measuring 1.1 x 0.8 cm. This may reflect a new small meningioma or alternately an anterior cerebral artery aneurysm. Consider contrast enhanced CT or MR angiogram to further evaluate on a nonemergent basis. 4. No fracture or static subluxation of  the cervical spine.  COGNITION: Overall cognitive status: No family/caregiver present to determine baseline cognitive functioning   SENSATION: Light touch: WFL  COORDINATION: LE RAMS:  WFL Heel-to-shin:  WFL bilaterally  EDEMA:  Mild non-pitting edema present in bilateral feet/ankles  MUSCLE TONE: none noted during functional tasks  POSTURE: rounded shoulders, forward head, increased thoracic kyphosis, posterior pelvic tilt, and flexed trunk   LOWER EXTREMITY ROM:     Active  Right Eval Left Eval  Hip flexion WFL bilaterally  Hip extension   Hip abduction   Hip adduction   Hip internal rotation   Hip external rotation   Knee flexion   Knee extension   Ankle dorsiflexion   Ankle plantarflexion   Ankle inversion    Ankle eversion     (Blank rows = not tested)  LOWER EXTREMITY MMT:    MMT Right Eval Left Eval  Hip flexion 4-/5 3+/5  Hip extension    Hip abduction    Hip adduction    Hip internal rotation    Hip external rotation    Knee flexion 4+/5 4/5  Knee extension 5/5 5/5  Ankle dorsiflexion 4/5 4/5  Ankle plantarflexion    Ankle inversion    Ankle eversion    (Blank rows = not tested)  BED MOBILITY:  Sit to supine Complete Independence Supine to sit Complete Independence  TRANSFERS: Assistive device utilized: Environmental consultant - 4 wheeled  Sit to stand: Modified independence Stand to sit: Modified independence Chair to chair: Modified independence Floor:  pt states her son taught her how to get out of the floor and that she does not need assistance ~50% of falls.  GAIT: Gait pattern: step through pattern, decreased step length- Right, decreased step length- Left, decreased stride length, decreased hip/knee flexion- Right, decreased hip/knee flexion- Left, trunk flexed, and wide BOS Distance walked: various clinic distances Assistive device utilized: Walker - 4 wheeled Level of assistance: SBA Comments: Pt demonstrates unsafe management of rollator  during approach to restroom pulling device behind her and does not turn  to approach sitting maintaining approximation to device safely.  FUNCTIONAL TESTS:  5 times sit to stand: 15.56 seconds w/o UE support 10 meter walk test: 22.46 seconds w/ rollator = 0.45 m/sec OR 1.47 ft/sec Berg Balance Scale: To be assessed.  PATIENT SURVEYS:  ABC scale To be assessed. FOTO N/A due to chronicity of stroke.  TODAY'S TREATMENT:                                                                                                                              DATE: N/A    PATIENT EDUCATION: Education details: PT POC, assessments used and to be used, and goals to be set.  Discussed using rollator in home vs furniture surfing especially in the dark as this is contributing to her falls. Person educated: Patient Education method: Explanation Education comprehension: verbalized understanding and needs further education  HOME EXERCISE PROGRAM: To be established.  GOALS: Goals reviewed with patient? Yes  SHORT TERM GOALS: Target date: 06/21/2022  Pt will be compliant to strength and balance HEP with supervision from family. Baseline:  To be established. Goal status: INITIAL  2.  Pt will demonstrate a gait speed of >/=1.87 feet/sec in order to decrease risk for falls. Baseline: 1.47 ft/sec w/ rollator Goal status: INITIAL  3.  BERG to be assessed w/ STG/LTG set as appropriate. Baseline: To be assessed. Goal status: INITIAL  LONG TERM GOALS: Target date: 07/19/2022  Pt will decrease 5xSTS to </=13 seconds w/o UE support in order to demonstrate decreased risk for falls and improved functional bilateral LE strength and power. Baseline: 15.56 sec w/o UE support Goal status: INITIAL  2.  Pt will demonstrate a gait speed of >/=2.27 feet/sec in order to decrease risk for falls. Baseline: 1.47 ft/sec w/ rollator Goal status: INITIAL  3.  BERG to be assessed w/ STG/LTG set as appropriate. Baseline: To be  assessed. Goal status: INITIAL  4.  ABC Scale to be assessed w/ LTG set as appropriate. Baseline: To be assessed. Goal status: INITIAL  5.  Pt will perform floor recovery w/ no more than modA to improve safety and ease of transfer in the event of a fall in home. Baseline: varying levels of assist per report Goal status: INITIAL  ASSESSMENT:  CLINICAL IMPRESSION: Patient is a 72 y.o. female who was seen today for physical therapy evaluation and treatment for imbalance and repeated falls.  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  Identified impairments include bilateral non-pitting ankle edema, chronic postural changes, proximal LE weakness, gait deficits requiring rollator for dynamic stability and decreased safety awareness.  Evaluation via the following assessment tools: 5xSTS and 10MWT indicate high fall risk especially in combination with her extensive fall history.  She would benefit from skilled PT to address impairments as noted and  progress towards long term goals.  OBJECTIVE IMPAIRMENTS: Abnormal gait, decreased balance, decreased cognition, decreased knowledge of use of DME, decreased strength, decreased safety awareness, increased edema, improper body mechanics, and postural dysfunction.   ACTIVITY LIMITATIONS: carrying, lifting, bending, standing, squatting, transfers, and locomotion level  PARTICIPATION LIMITATIONS: meal prep, cleaning, laundry, driving, and community activity  PERSONAL FACTORS: Age, Fitness, Time since onset of injury/illness/exacerbation, Transportation, and 1-2 comorbidities: seizures and HTN  are also affecting patient's functional outcome.   REHAB POTENTIAL: Good  CLINICAL DECISION MAKING: Evolving/moderate complexity  EVALUATION COMPLEXITY: Moderate  PLAN:  PT FREQUENCY:  1x/week (due to reliance on son for transportation-only available on Fridays)  PT DURATION: 8 weeks  PLANNED INTERVENTIONS: Therapeutic exercises, Therapeutic activity, Neuromuscular re-education, Balance training, Gait training, Patient/Family education, Self Care, Stair training, Vestibular training, DME instructions, and Re-evaluation  PLAN FOR NEXT SESSION: Assess BERG and ABC scale-set goals as appropriate.  Initiate HEP for functional LE strength and static balance.  Gait training w/ rollator.  Stair training.  Fall recovery.     Bary Richard, PT, DPT 05/23/2022, 5:09 PM

## 2022-05-28 DIAGNOSIS — G4733 Obstructive sleep apnea (adult) (pediatric): Secondary | ICD-10-CM | POA: Diagnosis not present

## 2022-06-02 DIAGNOSIS — E785 Hyperlipidemia, unspecified: Secondary | ICD-10-CM | POA: Diagnosis not present

## 2022-06-02 DIAGNOSIS — I10 Essential (primary) hypertension: Secondary | ICD-10-CM | POA: Diagnosis not present

## 2022-06-02 DIAGNOSIS — M130 Polyarthritis, unspecified: Secondary | ICD-10-CM | POA: Diagnosis not present

## 2022-06-07 ENCOUNTER — Ambulatory Visit: Payer: Medicare PPO | Admitting: Physical Therapy

## 2022-06-07 DIAGNOSIS — I10 Essential (primary) hypertension: Secondary | ICD-10-CM | POA: Diagnosis not present

## 2022-06-07 DIAGNOSIS — G40001 Localization-related (focal) (partial) idiopathic epilepsy and epileptic syndromes with seizures of localized onset, not intractable, with status epilepticus: Secondary | ICD-10-CM | POA: Diagnosis not present

## 2022-06-07 DIAGNOSIS — E785 Hyperlipidemia, unspecified: Secondary | ICD-10-CM | POA: Diagnosis not present

## 2022-06-14 ENCOUNTER — Ambulatory Visit: Payer: Medicare PPO | Admitting: Physical Therapy

## 2022-06-21 ENCOUNTER — Encounter: Payer: Self-pay | Admitting: Physical Therapy

## 2022-06-21 ENCOUNTER — Ambulatory Visit: Payer: Medicare PPO | Attending: Family Medicine | Admitting: Physical Therapy

## 2022-06-21 DIAGNOSIS — M6281 Muscle weakness (generalized): Secondary | ICD-10-CM | POA: Diagnosis not present

## 2022-06-21 DIAGNOSIS — R2681 Unsteadiness on feet: Secondary | ICD-10-CM | POA: Diagnosis not present

## 2022-06-21 DIAGNOSIS — R2689 Other abnormalities of gait and mobility: Secondary | ICD-10-CM | POA: Insufficient documentation

## 2022-06-21 NOTE — Patient Instructions (Signed)
Access Code: [email or web address] URL: [email or web address] Date: 06/21/2022 Prepared by: Camille Bal  Exercises - Sit to Stand with Arms Crossed  - 1 x daily - 4-5 x weekly - 2 sets - 10 reps - Standing March with Counter Support  - 1 x daily - 5 x weekly - 2 sets - 20 reps - Standing Hip Extension with Leg Bent and Support  - 1 x daily - 5 x weekly - 1-2 sets - 10 reps

## 2022-06-21 NOTE — Therapy (Signed)
OUTPATIENT PHYSICAL THERAPY NEURO TREATMENT   Patient Name: Deborah Gallegos MRN: [id number] DOB:[birth year], 72 y.o., female Today's Date: 06/21/2022   PCP: Renaye Rakers, MD REFERRING PROVIDER: Renaye Rakers, MD  END OF SESSION:  PT End of Session - 06/21/22 1019     Visit Number 2    Number of Visits 9   8 + eval   Date for PT Re-Evaluation 07/26/22   pushed out due to scheduling availability   Authorization Type [employer]ICARE    Progress Note Due on Visit 10    PT Start Time 1018    Equipment Utilized During Treatment Gait belt    Behavior During Therapy [facility] for tasks assessed/performed             Past Medical History:  Diagnosis Date   Hypertension    Seizure    Past Surgical History:  Procedure Laterality Date   ABDOMINAL HYSTERECTOMY     BRAIN TUMOR EXCISION     CATARACT EXTRACTION     EYE SURGERY     There are no problems to display for this patient.   ONSET DATE: 05/09/2022 (referral, pt unsure how long issues have been going on)   REFERRING DIAG: R26.89 (ICD-10-CM) - Balance disorder R26.9 (ICD-10-CM) - Gait abnormality  THERAPY DIAG:  Other abnormalities of gait and mobility  Muscle weakness (generalized)  Unsteadiness on feet  Rationale for Evaluation and Treatment: Rehabilitation  SUBJECTIVE:                                                                                                                                                                                             SUBJECTIVE STATEMENT: Pt states she has had a single fall and states it occurred when she was coming out of the bathroom.  She stepping on the base of a lamp and fell backwards and hit her head pretty hard and scraped her arm up.  She states she waited to see if she needed to go to the ED, but did not go as she felt okay.  She had a single seizure 3 days after the fall, but feels sleep deprivation caused this due staying up late.  She denies further issues or pain. Pt  accompanied by: self and son drove her, not present for session.  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 No  PRECAUTIONS: Fall  WEIGHT BEARING RESTRICTIONS: No  FALLS: Has patient fallen in last 6 months? Yes.  Number of falls >10  LIVING ENVIRONMENT: Lives with: lives with their son Lives in: House/apartment Stairs: Yes: External: 2 steps; can reach both Has following equipment at home: Single point cane, Quad cane small base, Walker - 2 wheeled, Walker - 4 wheeled, shower chair, bed side commode, and Grab bars  PLOF: Independent with basic ADLs, Independent with household mobility without device, Independent with transfers, and Needs assistance with homemaking  PATIENT GOALS: "I want to be able to walk from my bathroom in my bedroom to my bed."  OBJECTIVE:   DIAGNOSTIC FINDINGS: CT Head 10/06/2021 IMPRESSION: 1. No acute intracranial pathology. 2. Unchanged appearance of a coarsely calcified meningioma arising from the medial right middle cranial fossa with overlying encephalomalacia of the right temporal pole and insula likely related to prior resection. 3. New intermediate attenuation lesion at the right aspect of the anterior falx measuring 1.1 x 0.8 cm. This may reflect a new small meningioma or alternately an anterior cerebral artery aneurysm. Consider contrast enhanced CT or MR angiogram to further evaluate on a nonemergent basis. 4. No fracture or static subluxation of the cervical spine.  COGNITION: Overall cognitive status: No family/caregiver present to determine baseline cognitive functioning   SENSATION: Light touch: WFL  COORDINATION: LE RAMS:  WFL Heel-to-shin:  WFL bilaterally  EDEMA:  Mild non-pitting edema present in bilateral feet/ankles  MUSCLE  TONE: none noted during functional tasks  POSTURE: rounded shoulders, forward head, increased thoracic kyphosis, posterior pelvic tilt, and flexed trunk   LOWER EXTREMITY ROM:     Active  Right Eval Left Eval  Hip flexion WFL bilaterally  Hip extension   Hip abduction   Hip adduction   Hip internal rotation   Hip external rotation   Knee flexion   Knee extension   Ankle dorsiflexion   Ankle plantarflexion   Ankle inversion    Ankle eversion     (Blank rows = not tested)  LOWER EXTREMITY MMT:    MMT Right Eval Left Eval  Hip flexion 4-/5 3+/5  Hip extension    Hip abduction    Hip adduction    Hip internal rotation    Hip external rotation    Knee flexion 4+/5 4/5  Knee extension 5/5 5/5  Ankle dorsiflexion 4/5 4/5  Ankle plantarflexion    Ankle inversion    Ankle eversion    (Blank rows = not tested)  BED MOBILITY:  Sit to supine Complete Independence Supine to sit Complete Independence  TRANSFERS: Assistive device utilized: Environmental consultant - 4 wheeled  Sit to stand: Modified independence Stand to sit: Modified independence Chair to chair: Modified independence Floor:  pt states her son taught her how to get out of the floor and that she does not need assistance ~50% of falls.  GAIT: Gait pattern: step through pattern, decreased step length- Right, decreased step length- Left, decreased stride length, decreased hip/knee flexion- Right, decreased hip/knee flexion- Left, trunk flexed, and wide BOS Distance walked: various clinic distances Assistive device utilized: Walker - 4 wheeled Level of assistance: SBA Comments: Pt demonstrates unsafe management of rollator during approach to restroom pulling device behind her and does not turn to approach sitting maintaining approximation to device safely.  FUNCTIONAL TESTS:  5 times sit to stand: 15.56 seconds w/o UE support 10 meter walk test: 22.46 seconds w/ rollator = 0.45 m/sec OR 1.47 ft/sec Berg Balance Scale: To be  assessed.  PATIENT SURVEYS:  ABC scale To be assessed. FOTO N/A due to chronicity of stroke.  TODAY'S TREATMENT:                                                                                                                              DATE:  06/21/2022 -BERGMarlane Mingle PT Assessment - 06/21/22 1024       Standardized Balance Assessment   Standardized Balance Assessment Berg Balance Test      Berg Balance Test   Sit to Stand Able to stand without using hands and stabilize independently    Standing Unsupported Able to stand 2 minutes with supervision    Sitting with Back Unsupported but Feet Supported on Floor or Stool Able to sit safely and securely 2 minutes    Stand to Sit Sits safely with minimal use of hands    Transfers Able to transfer safely, minor use of hands    Standing Unsupported with Eyes Closed Able to stand 10 seconds safely    Standing Unsupported with Feet Together Able to place feet together independently and stand for 1 minute with supervision    From Standing, Reach Forward with Outstretched Arm Can reach confidently >25 cm (10")    From Standing Position, Pick up Object from Floor Able to pick up shoe, needs supervision    From Standing Position, Turn to Look Behind Over each Shoulder Turn sideways only but maintains balance    Turn 360 Degrees Able to turn 360 degrees safely but slowly    Standing Unsupported, Alternately Place Feet on Step/Stool Able to complete 4 steps without aid or supervision    Standing Unsupported, One Foot in Front Able to take small step independently and hold 30 seconds    Standing on One Leg Tries to lift leg/unable to hold 3 seconds but remains standing independently    Total Score 42    Berg comment: 42/56 = significant fall risk            -ABC Scale: 61.875%  Initiated HEP: -STS x10 -Standing marches x30 -Glut kickbacks w/ bent knee x10 each LE  PATIENT EDUCATION: Education details: Outcomes interpretations and initial  HEP. Person educated: Patient Education method: Explanation Education comprehension: verbalized understanding and needs further education  HOME EXERCISE PROGRAM: Access Code: [email or web address] URL: [email or web address] Date: 06/21/2022 Prepared by: Camille Bal  Exercises - Sit to Stand with Arms Crossed  - 1 x daily - 4-5 x weekly - 2 sets - 10 reps - Standing March with Counter Support  - 1 x daily - 5 x weekly - 2 sets - 20 reps - Standing Hip Extension with Leg Bent and Support  - 1 x daily - 5 x weekly - 1-2 sets - 10 reps  GOALS: Goals reviewed with patient? Yes  SHORT TERM GOALS: Target date: 06/21/2022  Pt will be compliant to strength and balance HEP with supervision from family. Baseline:  To be established. Goal status: INITIAL  2.  Pt will demonstrate a gait speed of >/=1.87 feet/sec in order  to decrease risk for falls. Baseline: 1.47 ft/sec w/ rollator Goal status: INITIAL  3.  Pt will increase BERG balance score to >/=47/56 to demonstrate improved static balance. Baseline: 42/56 (4/19) Goal status: INITIAL  LONG TERM GOALS: Target date: 07/19/2022  Pt will decrease 5xSTS to </=13 seconds w/o UE support in order to demonstrate decreased risk for falls and improved functional bilateral LE strength and power. Baseline: 15.56 sec w/o UE support Goal status: INITIAL  2.  Pt will demonstrate a gait speed of >/=2.27 feet/sec in order to decrease risk for falls. Baseline: 1.47 ft/sec w/ rollator Goal status: INITIAL  3.  Pt will increase BERG balance score to >/=52/56 to demonstrate improved static balance. Baseline: 42/56 (4/19) Goal status: INITIAL  4.  Patient will improve ABC scale score to >/= 70% to demonstrate decreased fear of falling and improved confidence in functional mobility. Baseline: 61.88% (4/19) Goal status: INITIAL  5.  Pt will perform floor recovery w/ no more than modA to improve safety and ease of transfer in the event of a fall  in home. Baseline: varying levels of assist per report Goal status: INITIAL  ASSESSMENT:  CLINICAL IMPRESSION: Assessed BERG this session with patient scoring 42/56 indicating a high fall risk.  She also rates herself lower in confidence regarding her balance during performance of functional tasks scoring a 61.88% on the activities balance confidence scale.  This further indicates a higher fear of falling.  Time spent initiating a functional strengthening HEP.  Patient continues to benefit from skilled PT to address deficits as outlined in ongoing POC.  OBJECTIVE IMPAIRMENTS: Abnormal gait, decreased balance, decreased cognition, decreased knowledge of use of DME, decreased strength, decreased safety awareness, increased edema, improper body mechanics, and postural dysfunction.   ACTIVITY LIMITATIONS: carrying, lifting, bending, standing, squatting, transfers, and locomotion level  PARTICIPATION LIMITATIONS: meal prep, cleaning, laundry, driving, and community activity  PERSONAL FACTORS: Age, Fitness, Time since onset of injury/illness/exacerbation, Transportation, and 1-2 comorbidities: seizures and HTN  are also affecting patient's functional outcome.   REHAB POTENTIAL: Good  CLINICAL DECISION MAKING: Evolving/moderate complexity  EVALUATION COMPLEXITY: Moderate  PLAN:  PT FREQUENCY: 1x/week (due to reliance on son for transportation-only available on Fridays)  PT DURATION: 8 weeks  PLANNED INTERVENTIONS: Therapeutic exercises, Therapeutic activity, Neuromuscular re-education, Balance training, Gait training, Patient/Family education, Self Care, Stair training, Vestibular training, DME instructions, and Re-evaluation  PLAN FOR NEXT SESSION: Modify HEP prn for functional LE strength and static/dynamic balance.  Gait training w/ rollator vs LBQC (pt has a personal LBQC).  Stair training.  Fall recovery.     Sadie Haber, PT, DPT 06/21/2022, 10:19 AM

## 2022-06-28 ENCOUNTER — Ambulatory Visit: Payer: Medicare PPO | Admitting: Physical Therapy

## 2022-06-28 DIAGNOSIS — G4733 Obstructive sleep apnea (adult) (pediatric): Secondary | ICD-10-CM | POA: Diagnosis not present

## 2022-07-04 ENCOUNTER — Ambulatory Visit: Payer: Medicare PPO | Attending: Family Medicine | Admitting: Physical Therapy

## 2022-07-04 ENCOUNTER — Encounter: Payer: Self-pay | Admitting: Physical Therapy

## 2022-07-04 DIAGNOSIS — R2681 Unsteadiness on feet: Secondary | ICD-10-CM

## 2022-07-04 DIAGNOSIS — R2689 Other abnormalities of gait and mobility: Secondary | ICD-10-CM | POA: Insufficient documentation

## 2022-07-04 DIAGNOSIS — M6281 Muscle weakness (generalized): Secondary | ICD-10-CM | POA: Diagnosis not present

## 2022-07-04 NOTE — Therapy (Signed)
OUTPATIENT PHYSICAL THERAPY NEURO TREATMENT   Patient Name: Deborah Gallegos MRN: [id number] DOB:[birth year], 72 y.o., female Today's Date: 07/04/2022   PCP: Renaye Rakers, MD REFERRING PROVIDER: Renaye Rakers, MD  END OF SESSION:  PT End of Session - 07/04/22 1542     Visit Number 3    Number of Visits 9   8 + eval   Date for PT Re-Evaluation 07/26/22   pushed out due to scheduling availability   Authorization Type [employer]IC[employer]    Progress Note Due on Visit 10    PT Start Time 1542   Pt arrived late   PT Stop Time 1616    PT Time Calculation (min) 34 min    Equipment Utilized During Treatment Gait belt    Activity Tolerance Patient tolerated treatment well    Behavior During Therapy WFL for tasks assessed/performed             Past Medical History:  Diagnosis Date   Hypertension    Seizure (HCC)    Past Surgical History:  Procedure Laterality Date   ABDOMINAL HYSTERECTOMY     BRAIN TUMOR EXCISION     CATARACT EXTRACTION     EYE SURGERY     There are no problems to display for this patient.   ONSET DATE: 05/09/2022 (referral, pt unsure how long issues have been going on)   REFERRING DIAG: R26.89 (ICD-10-CM) - Balance disorder R26.9 (ICD-10-CM) - Gait abnormality  THERAPY DIAG:  Other abnormalities of gait and mobility  Muscle weakness (generalized)  Unsteadiness on feet  Rationale for Evaluation and Treatment: Rehabilitation  SUBJECTIVE:                                                                                                                                                                                             SUBJECTIVE STATEMENT: Pt states she has had a fall since her last session. She fell in the kitchen.  She thinks it was because she was not moving her leg when she went to turn and that she fell onto her left knee.  She denies bruising or worse injury and did not hit her head that she can recall.  She states her son helped her out of the  floor.  She denies further seizures.  She slept well last night. Pt accompanied by: self and son drove her, not present for session.  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 No-denies pain even  in left knee, upon visualization medial healing bruise present w/ fingertip size, well-healing scab towards outer edge of bruise laterally.  PRECAUTIONS: Fall  WEIGHT BEARING RESTRICTIONS: No  FALLS: Has patient fallen in last 6 months? Yes. Number of falls >10  LIVING ENVIRONMENT: Lives with: lives with their son Lives in: House/apartment Stairs: Yes: External: 2 steps; can reach both Has following equipment at home: Single point cane, Quad cane small base, Walker - 2 wheeled, Walker - 4 wheeled, shower chair, bed side commode, and Grab bars  PLOF: Independent with basic ADLs, Independent with household mobility without device, Independent with transfers, and Needs assistance with homemaking  PATIENT GOALS: "I want to be able to walk from my bathroom in my bedroom to my bed."  OBJECTIVE:   DIAGNOSTIC FINDINGS: CT Head 10/06/2021 IMPRESSION: 1. No acute intracranial pathology. 2. Unchanged appearance of a coarsely calcified meningioma arising from the medial right middle cranial fossa with overlying encephalomalacia of the right temporal pole and insula likely related to prior resection. 3. New intermediate attenuation lesion at the right aspect of the anterior falx measuring 1.1 x 0.8 cm. This may reflect a new small meningioma or alternately an anterior cerebral artery aneurysm. Consider contrast enhanced CT or MR angiogram to further evaluate on a nonemergent basis. 4. No fracture or static subluxation of the cervical spine.  COGNITION: Overall cognitive status: No family/caregiver  present to determine baseline cognitive functioning   SENSATION: Light touch: WFL  COORDINATION: LE RAMS:  WFL Heel-to-shin:  WFL bilaterally  EDEMA:  Mild non-pitting edema present in bilateral feet/ankles  MUSCLE TONE: none noted during functional tasks  POSTURE: rounded shoulders, forward head, increased thoracic kyphosis, posterior pelvic tilt, and flexed trunk   LOWER EXTREMITY ROM:     Active  Right Eval Left Eval  Hip flexion WFL bilaterally  Hip extension   Hip abduction   Hip adduction   Hip internal rotation   Hip external rotation   Knee flexion   Knee extension   Ankle dorsiflexion   Ankle plantarflexion   Ankle inversion    Ankle eversion     (Blank rows = not tested)  LOWER EXTREMITY MMT:    MMT Right Eval Left Eval  Hip flexion 4-/5 3+/5  Hip extension    Hip abduction    Hip adduction    Hip internal rotation    Hip external rotation    Knee flexion 4+/5 4/5  Knee extension 5/5 5/5  Ankle dorsiflexion 4/5 4/5  Ankle plantarflexion    Ankle inversion    Ankle eversion    (Blank rows = not tested)  BED MOBILITY:  Sit to supine Complete Independence Supine to sit Complete Independence  TRANSFERS: Assistive device utilized: Environmental consultant - 4 wheeled  Sit to stand: Modified independence Stand to sit: Modified independence Chair to chair: Modified independence Floor:  pt states her son taught her how to get out of the floor and that she does not need assistance ~50% of falls.  GAIT: Gait pattern: step through pattern, decreased step length- Right, decreased step length- Left, decreased stride length, decreased hip/knee flexion- Right, decreased hip/knee flexion- Left, trunk flexed, and wide BOS Distance walked: various clinic distances Assistive device utilized: Walker - 4 wheeled Level of assistance: SBA Comments: Pt demonstrates unsafe management of rollator during approach to restroom pulling device behind her and does not turn to approach  sitting maintaining approximation to device safely.  FUNCTIONAL TESTS:  5 times sit to stand: 15.56 seconds w/o  UE support 10 meter walk test: 22.46 seconds w/ rollator = 0.45 m/sec OR 1.47 ft/sec Berg Balance Scale: To be assessed.  PATIENT SURVEYS:  ABC scale To be assessed. FOTO N/A due to chronicity of stroke.  TODAY'S TREATMENT:                                                                                                                              DATE:  07/04/2022 -Verbally and physically reviewed HEP 1 set of each w/ corrections to form, re-printed per pt request - w/ rollator = 17.25 seconds = 0.58 m/sec OR 1.91 ft/sec -BERG:  [facility] PT Assessment - 07/04/22 1601       Berg Balance Test   Sit to Stand Able to stand without using hands and stabilize independently    Standing Unsupported Able to stand safely 2 minutes    Sitting with Back Unsupported but Feet Supported on Floor or Stool Able to sit safely and securely 2 minutes    Stand to Sit Sits safely with minimal use of hands    Transfers Able to transfer safely, minor use of hands    Standing Unsupported with Eyes Closed Able to stand 10 seconds safely    Standing Unsupported with Feet Together Able to place feet together independently and stand 1 minute safely    From Standing, Reach Forward with Outstretched Arm Can reach forward >12 cm safely (5")   just under 9"   From Standing Position, Pick up Object from Floor Able to pick up shoe safely and easily    From Standing Position, Turn to Look Behind Over each Shoulder Looks behind from both sides and weight shifts well    Turn 360 Degrees Able to turn 360 degrees safely but slowly    Standing Unsupported, Alternately Place Feet on Step/Stool Able to stand independently and complete 8 steps >20 seconds    Standing Unsupported, One Foot in Front Able to plae foot ahead of the other independently and hold 30 seconds   wide semi-tandem   Standing on One Leg Tries to  lift leg/unable to hold 3 seconds but remains standing independently    Total Score 48    Berg comment: 48/56 = significant to high fall risk            PATIENT EDUCATION: Education details: Outcomes interpretations and initial HEP.  Reminder of patient visit coming up and no show policy.  Did discuss potential benefit of having caregiver present, but pt does not report this is viable due to son's work schedule.   Person educated: Patient Education method: Explanation Education comprehension: verbalized understanding and needs further education  HOME EXERCISE PROGRAM: Access Code: [email or web address] URL: [email or web address] Date: 06/21/2022 Prepared by: Camille Bal  Exercises - Sit to Stand with Arms Crossed  - 1 x daily - 4-5 x weekly - 2 sets - 10 reps - Standing March with Counter Support  - 1 x daily - 5  x weekly - 2 sets - 20 reps - Standing Hip Extension with Leg Bent and Support  - 1 x daily - 5 x weekly - 1-2 sets - 10 reps  GOALS: Goals reviewed with patient? Yes  SHORT TERM GOALS: Target date: 06/21/2022  Pt will be compliant to strength and balance HEP with supervision from family. Baseline:  Pt intermittently compliant, needs updates. (5/2) Goal status: IN PROGRESS  2.  Pt will demonstrate a gait speed of >/=1.87 feet/sec in order to decrease risk for falls. Baseline: 1.47 ft/sec w/ rollator; 1.91 ft/sec w/ rollator (5/2) Goal status: MET  3.  Pt will increase BERG balance score to >/=47/56 to demonstrate improved static balance. Baseline: 42/56 (4/19); 48/56 (5/2) Goal status: MET  LONG TERM GOALS: Target date: 07/19/2022  Pt will decrease 5xSTS to </=13 seconds w/o UE support in order to demonstrate decreased risk for falls and improved functional bilateral LE strength and power. Baseline: 15.56 sec w/o UE support Goal status: INITIAL  2.  Pt will demonstrate a gait speed of >/=2.27 feet/sec in order to decrease risk for falls. Baseline: 1.47  ft/sec w/ rollator Goal status: INITIAL  3.  Pt will increase BERG balance score to >/=52/56 to demonstrate improved static balance. Baseline: 42/56 (4/19); 48/56 (5/2) Goal status: INITIAL  4.  Patient will improve ABC scale score to >/= 70% to demonstrate decreased fear of falling and improved confidence in functional mobility. Baseline: 61.88% (4/19) Goal status: INITIAL  5.  Pt will perform floor recovery w/ no more than modA to improve safety and ease of transfer in the event of a fall in home. Baseline: varying levels of assist per report Goal status: INITIAL  ASSESSMENT:  CLINICAL IMPRESSION: Memory deficits and carryover limiting progression in this setting.  Pt lacks caregiver available to come to sessions to assist with adherence to recommendations and HEP at home.  Pt demonstrates improvement in balance and gait speed today which could be due to medical fluctuations vs PT intervention as patient has only been seen for singular treatment session.  Patient remains severely limited by endurance requiring several seated rest breaks throughout assessment.  Due to these observations she may benefit more from therapy in the home setting and PT will continue to address this situation if patient returns to clinic for follow-up visits.  OBJECTIVE IMPAIRMENTS: Abnormal gait, decreased balance, decreased cognition, decreased knowledge of use of DME, decreased strength, decreased safety awareness, increased edema, improper body mechanics, and postural dysfunction.   ACTIVITY LIMITATIONS: carrying, lifting, bending, standing, squatting, transfers, and locomotion level  PARTICIPATION LIMITATIONS: meal prep, cleaning, laundry, driving, and community activity  PERSONAL FACTORS: Age, Fitness, Time since onset of injury/illness/exacerbation, Transportation, and 1-2 comorbidities: seizures and HTN  are also affecting patient's functional outcome.   REHAB POTENTIAL: Good  CLINICAL DECISION  MAKING: Evolving/moderate complexity  EVALUATION COMPLEXITY: Moderate  PLAN:  PT FREQUENCY: 1x/week (due to reliance on son for transportation-only available on Fridays)  PT DURATION: 8 weeks  PLANNED INTERVENTIONS: Therapeutic exercises, Therapeutic activity, Neuromuscular re-education, Balance training, Gait training, Patient/Family education, Self Care, Stair training, Vestibular training, DME instructions, and Re-evaluation  PLAN FOR NEXT SESSION: Modify HEP prn for functional LE strength and static/dynamic balance.  Gait training w/ rollator vs LBQC (pt has a personal LBQC).  Stair training.  Fall recovery.     Sadie Haber, PT, DPT 07/04/2022, 4:16 PM

## 2022-07-05 ENCOUNTER — Ambulatory Visit: Payer: Medicare PPO | Admitting: Physical Therapy

## 2022-07-12 ENCOUNTER — Ambulatory Visit: Payer: Medicare PPO | Admitting: Physical Therapy

## 2022-07-12 ENCOUNTER — Encounter: Payer: Self-pay | Admitting: Physical Therapy

## 2022-07-12 DIAGNOSIS — M6281 Muscle weakness (generalized): Secondary | ICD-10-CM

## 2022-07-12 DIAGNOSIS — R2689 Other abnormalities of gait and mobility: Secondary | ICD-10-CM | POA: Diagnosis not present

## 2022-07-12 DIAGNOSIS — R2681 Unsteadiness on feet: Secondary | ICD-10-CM | POA: Diagnosis not present

## 2022-07-12 NOTE — Therapy (Signed)
OUTPATIENT PHYSICAL THERAPY NEURO TREATMENT   Patient Name: Deborah Gallegos MRN: [id number] DOB:1950-03-18, 72 y.o., female Today's Date: 07/12/2022   PCP: Renaye Rakers, MD REFERRING PROVIDER: Renaye Rakers, MD  END OF SESSION:  PT End of Session - 07/12/22 1022     Visit Number 4    Number of Visits 9   8 + eval   Date for PT Re-Evaluation 07/26/22   pushed out due to scheduling availability   Authorization Type [employer]IC[employer]    Progress Note Due on Visit 10    PT Start Time 1018   pt arrived late   PT Stop Time 1059    PT Time Calculation (min) 41 min    Equipment Utilized During Treatment Gait belt    Activity Tolerance Patient tolerated treatment well    Behavior During Therapy WFL for tasks assessed/performed             Past Medical History:  Diagnosis Date   Hypertension    Seizure (HCC)    Past Surgical History:  Procedure Laterality Date   ABDOMINAL HYSTERECTOMY     BRAIN TUMOR EXCISION     CATARACT EXTRACTION     EYE SURGERY     There are no problems to display for this patient.   ONSET DATE: 05/09/2022 (referral, pt unsure how long issues have been going on)   REFERRING DIAG: R26.89 (ICD-10-CM) - Balance disorder R26.9 (ICD-10-CM) - Gait abnormality  THERAPY DIAG:  Other abnormalities of gait and mobility  Muscle weakness (generalized)  Unsteadiness on feet  Rationale for Evaluation and Treatment: Rehabilitation  SUBJECTIVE:                                                                                                                                                                                             SUBJECTIVE STATEMENT: Pt has had a single fall in her kitchen since last session.  She states she was pulling something out of the refrigerator/ opening the refrigerator door without using her rollator and turned around and fell backwards.   Pt accompanied by: self and son drove her, not present for session.  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 No-denies pain even in left knee, upon visualization medial healing bruise present w/ fingertip size, well-healing scab towards outer edge of bruise laterally.  PRECAUTIONS: Fall  WEIGHT BEARING RESTRICTIONS: No  FALLS: Has patient fallen in last 6 months? Yes. Number of falls >  10  LIVING ENVIRONMENT: Lives with: lives with their son Lives in: House/apartment Stairs: Yes: External: 2 steps; can reach both Has following equipment at home: Single point cane, Quad cane small base, Walker - 2 wheeled, Walker - 4 wheeled, shower chair, bed side commode, and Grab bars  PLOF: Independent with basic ADLs, Independent with household mobility without device, Independent with transfers, and Needs assistance with homemaking  PATIENT GOALS: "I want to be able to walk from my bathroom in my bedroom to my bed."  OBJECTIVE:   DIAGNOSTIC FINDINGS: CT Head 10/06/2021 IMPRESSION: 1. No acute intracranial pathology. 2. Unchanged appearance of a coarsely calcified meningioma arising from the medial right middle cranial fossa with overlying encephalomalacia of the right temporal pole and insula likely related to prior resection. 3. New intermediate attenuation lesion at the right aspect of the anterior falx measuring 1.1 x 0.8 cm. This may reflect a new small meningioma or alternately an anterior cerebral artery aneurysm. Consider contrast enhanced CT or MR angiogram to further evaluate on a nonemergent basis. 4. No fracture or static subluxation of the cervical spine.  COGNITION: Overall cognitive status: No family/caregiver present to determine baseline cognitive functioning   SENSATION: Light touch: WFL  COORDINATION: LE RAMS:  WFL Heel-to-shin:  WFL bilaterally  EDEMA:  Mild  non-pitting edema present in bilateral feet/ankles  MUSCLE TONE: none noted during functional tasks  POSTURE: rounded shoulders, forward head, increased thoracic kyphosis, posterior pelvic tilt, and flexed trunk   LOWER EXTREMITY ROM:     Active  Right Eval Left Eval  Hip flexion WFL bilaterally  Hip extension   Hip abduction   Hip adduction   Hip internal rotation   Hip external rotation   Knee flexion   Knee extension   Ankle dorsiflexion   Ankle plantarflexion   Ankle inversion    Ankle eversion     (Blank rows = not tested)  LOWER EXTREMITY MMT:    MMT Right Eval Left Eval  Hip flexion 4-/5 3+/5  Hip extension    Hip abduction    Hip adduction    Hip internal rotation    Hip external rotation    Knee flexion 4+/5 4/5  Knee extension 5/5 5/5  Ankle dorsiflexion 4/5 4/5  Ankle plantarflexion    Ankle inversion    Ankle eversion    (Blank rows = not tested)  BED MOBILITY:  Sit to supine Complete Independence Supine to sit Complete Independence  TRANSFERS: Assistive device utilized: Environmental consultant - 4 wheeled  Sit to stand: Modified independence Stand to sit: Modified independence Chair to chair: Modified independence Floor:  pt states her son taught her how to get out of the floor and that she does not need assistance ~50% of falls.  GAIT: Gait pattern: step through pattern, decreased step length- Right, decreased step length- Left, decreased stride length, decreased hip/knee flexion- Right, decreased hip/knee flexion- Left, trunk flexed, and wide BOS Distance walked: various clinic distances Assistive device utilized: Walker - 4 wheeled Level of assistance: SBA Comments: Pt demonstrates unsafe management of rollator during approach to restroom pulling device behind her and does not turn to approach sitting maintaining approximation to device safely.  FUNCTIONAL TESTS:  5 times sit to stand: 15.56 seconds w/o UE support 10 meter walk test: 22.46 seconds w/  rollator = 0.45 m/sec OR 1.47 ft/sec Berg Balance Scale: To be assessed.  PATIENT SURVEYS:  ABC scale To be assessed. FOTO N/A due to chronicity of stroke.  TODAY'S TREATMENT:  GAIT: Gait pattern: step to pattern, decreased arm swing- Left, decreased step length- Right, decreased step length- Left, decreased stride length, decreased hip/knee flexion- Right, decreased hip/knee flexion- Left, and trunk flexed Distance walked: 200' + 200' Assistive device utilized: Education officer, museum cane large base-used on right side Level of assistance: SBA and CGA Comments: Pt visually observes both NBQC and LBQC and states she has the NBQC no LBQC as previously thought.  Pt has drastically decreased gait speed w/ cane vs rollator.  She appears steady at onset of ambulation but as distance increased and PT no longer providing cues for sequencing, pt begins to bring cane too close to body to safely sequence resulting in single instance of dragging AD.  Discussed this with pt and concerns for using cane at home as she has tried recently due to risk of falling from poor use and fatigue.  She is visibly more fatigued w/ reduced step size, response time to cues, and increased trunk flexion on second trial.  She requires increased time to complete each bout.  Pt states LBQC feels heavy at end of second and final bout.  PATIENT EDUCATION: Education details: Educated patient on general safety using rollator full time to limit falls.  Discussed no longer pulling objects around/down in the kitchen as this continues to make her fall and to have someone help her when she needs something. Person educated: Patient-spoke to son, Francee Piccolo, in lobby briefly Education method: Explanation Education comprehension: verbalized understanding and needs further education  HOME EXERCISE PROGRAM: Access  Code: [email or web address] URL: [email or web address] Date: 06/21/2022 Prepared by: Camille Bal  Exercises - Sit to Stand with Arms Crossed  - 1 x daily - 4-5 x weekly - 2 sets - 10 reps - Standing March with Counter Support  - 1 x daily - 5 x weekly - 2 sets - 20 reps - Standing Hip Extension with Leg Bent and Support  - 1 x daily - 5 x weekly - 1-2 sets - 10 reps  GOALS: Goals reviewed with patient? Yes  SHORT TERM GOALS: Target date: 06/21/2022  Pt will be compliant to strength and balance HEP with supervision from family. Baseline:  Pt intermittently compliant, needs updates. (5/2) Goal status: IN PROGRESS  2.  Pt will demonstrate a gait speed of >/=1.87 feet/sec in order to decrease risk for falls. Baseline: 1.47 ft/sec w/ rollator; 1.91 ft/sec w/ rollator (5/2) Goal status: MET  3.  Pt will increase BERG balance score to >/=47/56 to demonstrate improved static balance. Baseline: 42/56 (4/19); 48/56 (5/2) Goal status: MET  LONG TERM GOALS: Target date: 07/19/2022  Pt will decrease 5xSTS to </=13 seconds w/o UE support in order to demonstrate decreased risk for falls and improved functional bilateral LE strength and power. Baseline: 15.56 sec w/o UE support Goal status: INITIAL  2.  Pt will demonstrate a gait speed of >/=2.27 feet/sec in order to decrease risk for falls. Baseline: 1.47 ft/sec w/ rollator Goal status: INITIAL  3.  Pt will increase BERG balance score to >/=52/56 to demonstrate improved static balance. Baseline: 42/56 (4/19); 48/56 (5/2) Goal status: INITIAL  4.  Patient will improve ABC scale score to >/= 70% to demonstrate decreased fear of falling and improved confidence in functional mobility. Baseline: 61.88% (4/19) Goal status: INITIAL  5.  Pt will perform floor recovery w/ no more than modA to improve safety and ease of transfer in the event of a fall in home. Baseline: varying levels of assist per  report Goal status:  INITIAL  ASSESSMENT:  CLINICAL IMPRESSION: Memory deficits and general safety awareness continue to limit pt progress and carryover to home environment.  She continues to have falls in her home as she intermittently misuses or does not use her AD.  Continued to address gait training this session with assessment of narrow and large base quad canes as pt has access to NBQC in her home.  Neither option are safe for pt to use at home at this time as she does not have consistent supervision.  Will continue to address functional deficits to promote safety with upright mobility as able.  OBJECTIVE IMPAIRMENTS: Abnormal gait, decreased balance, decreased cognition, decreased knowledge of use of DME, decreased strength, decreased safety awareness, increased edema, improper body mechanics, and postural dysfunction.   ACTIVITY LIMITATIONS: carrying, lifting, bending, standing, squatting, transfers, and locomotion level  PARTICIPATION LIMITATIONS: meal prep, cleaning, laundry, driving, and community activity  PERSONAL FACTORS: Age, Fitness, Time since onset of injury/illness/exacerbation, Transportation, and 1-2 comorbidities: seizures and HTN  are also affecting patient's functional outcome.   REHAB POTENTIAL: Good  CLINICAL DECISION MAKING: Evolving/moderate complexity  EVALUATION COMPLEXITY: Moderate  PLAN:  PT FREQUENCY: 1x/week (due to reliance on son for transportation-only available on Fridays)  PT DURATION: 8 weeks  PLANNED INTERVENTIONS: Therapeutic exercises, Therapeutic activity, Neuromuscular re-education, Balance training, Gait training, Patient/Family education, Self Care, Stair training, Vestibular training, DME instructions, and Re-evaluation  PLAN FOR NEXT SESSION: Modify HEP prn for functional LE strength and static/dynamic balance.  Gait training w/ rollator vs LBQC (pt has a personal LBQC).  Stair training.  Fall recovery. Turning/quarter and clock turns w/ and w/o  rollator.  Sadie Haber, PT, DPT 07/12/2022, 10:59 AM

## 2022-07-19 ENCOUNTER — Telehealth: Payer: Self-pay | Admitting: Physical Therapy

## 2022-07-19 ENCOUNTER — Ambulatory Visit: Payer: Medicare PPO | Admitting: Physical Therapy

## 2022-07-19 NOTE — Telephone Encounter (Addendum)
Spoke to pt after no show appt today and pt is agreeable to pursue HHPT in place of outpatient due to difficulty reaching appts.  PT called and spoke to receptionist at Dr[facility] Ted[facility] practice and provided request for HHPT order and call back number for front office of PT clinic if any questions arise.  Will discharge pt without goal assessment at this time.    Camille Bal, PT, DPT

## 2022-07-26 ENCOUNTER — Ambulatory Visit: Payer: Medicare PPO | Admitting: Physical Therapy

## 2022-07-28 DIAGNOSIS — G4733 Obstructive sleep apnea (adult) (pediatric): Secondary | ICD-10-CM | POA: Diagnosis not present

## 2022-08-02 DIAGNOSIS — I10 Essential (primary) hypertension: Secondary | ICD-10-CM | POA: Diagnosis not present

## 2022-08-02 DIAGNOSIS — E785 Hyperlipidemia, unspecified: Secondary | ICD-10-CM | POA: Diagnosis not present

## 2022-08-12 DIAGNOSIS — E1169 Type 2 diabetes mellitus with other specified complication: Secondary | ICD-10-CM | POA: Diagnosis not present

## 2022-08-12 DIAGNOSIS — M130 Polyarthritis, unspecified: Secondary | ICD-10-CM | POA: Diagnosis not present

## 2022-08-12 DIAGNOSIS — I10 Essential (primary) hypertension: Secondary | ICD-10-CM | POA: Diagnosis not present

## 2022-08-12 DIAGNOSIS — G40009 Localization-related (focal) (partial) idiopathic epilepsy and epileptic syndromes with seizures of localized onset, not intractable, without status epilepticus: Secondary | ICD-10-CM | POA: Diagnosis not present

## 2022-09-20 DIAGNOSIS — G40001 Localization-related (focal) (partial) idiopathic epilepsy and epileptic syndromes with seizures of localized onset, not intractable, with status epilepticus: Secondary | ICD-10-CM | POA: Diagnosis not present

## 2022-09-20 DIAGNOSIS — I10 Essential (primary) hypertension: Secondary | ICD-10-CM | POA: Diagnosis not present

## 2022-09-20 DIAGNOSIS — H9190 Unspecified hearing loss, unspecified ear: Secondary | ICD-10-CM | POA: Diagnosis not present

## 2022-09-20 DIAGNOSIS — E1169 Type 2 diabetes mellitus with other specified complication: Secondary | ICD-10-CM | POA: Diagnosis not present

## 2022-09-20 DIAGNOSIS — E7800 Pure hypercholesterolemia, unspecified: Secondary | ICD-10-CM | POA: Diagnosis not present

## 2022-09-27 ENCOUNTER — Ambulatory Visit: Payer: Medicare PPO | Attending: Family Medicine | Admitting: Physical Therapy

## 2022-09-27 ENCOUNTER — Telehealth: Payer: Self-pay | Admitting: Physical Therapy

## 2022-09-27 DIAGNOSIS — R2689 Other abnormalities of gait and mobility: Secondary | ICD-10-CM | POA: Insufficient documentation

## 2022-09-27 DIAGNOSIS — R2681 Unsteadiness on feet: Secondary | ICD-10-CM | POA: Insufficient documentation

## 2022-09-27 DIAGNOSIS — M6281 Muscle weakness (generalized): Secondary | ICD-10-CM | POA: Insufficient documentation

## 2022-09-27 NOTE — Therapy (Signed)
Patient Name: Deborah Gallegos MRN: [id number] DOB:1951-01-08, 72 y.o., female Today's Date: 09/27/2022   PT End of Session - 09/27/22 0938     Visit Number 1    Number of Visits 1   Pt more appropriate for HHPT   Authorization Type [employer]    PT Start Time 0935   Previous pt session ran late   PT Stop Time 0945    PT Time Calculation (min) 10 min    Activity Tolerance Patient tolerated treatment well    Behavior During Therapy [facility] for tasks assessed/performed              Pt presents to clinic alone for scheduled PT evaluation. States her son dropped her off. Pt unaware that she has been to this clinic before and states she did not receive HHPT following DC in May, states she would rather have HH therapies as she has no transportation. Therapist to contact PCP and request referral for home health therapies. Provided pt w/handout of written instructions as pt has poor cognition and son not present. Escorted pt out of clinic to waiting area, min cues for safe management of AD and min A for anterior LOB due to catching of L foot.    [name] E [name], PT 09/27/2022, 9:45 AM

## 2022-09-27 NOTE — Telephone Encounter (Signed)
Called Dr. Tedra Senegal office to request HHPT and [facility] referrals for Deborah Gallegos as she is not appropriate for OPPT due to lack of transportation and severe cognitive deficits.

## 2022-09-30 DIAGNOSIS — G40009 Localization-related (focal) (partial) idiopathic epilepsy and epileptic syndromes with seizures of localized onset, not intractable, without status epilepticus: Secondary | ICD-10-CM | POA: Diagnosis not present

## 2022-10-02 DIAGNOSIS — M8588 Other specified disorders of bone density and structure, other site: Secondary | ICD-10-CM | POA: Diagnosis not present

## 2022-10-02 DIAGNOSIS — Z1231 Encounter for screening mammogram for malignant neoplasm of breast: Secondary | ICD-10-CM | POA: Diagnosis not present

## 2022-10-02 DIAGNOSIS — N958 Other specified menopausal and perimenopausal disorders: Secondary | ICD-10-CM | POA: Diagnosis not present

## 2022-10-14 DIAGNOSIS — M1990 Unspecified osteoarthritis, unspecified site: Secondary | ICD-10-CM | POA: Diagnosis not present

## 2022-10-14 DIAGNOSIS — E669 Obesity, unspecified: Secondary | ICD-10-CM | POA: Diagnosis not present

## 2022-10-14 DIAGNOSIS — R7303 Prediabetes: Secondary | ICD-10-CM | POA: Diagnosis not present

## 2022-10-14 DIAGNOSIS — G4700 Insomnia, unspecified: Secondary | ICD-10-CM | POA: Diagnosis not present

## 2022-10-14 DIAGNOSIS — G40909 Epilepsy, unspecified, not intractable, without status epilepticus: Secondary | ICD-10-CM | POA: Diagnosis not present

## 2022-10-14 DIAGNOSIS — E785 Hyperlipidemia, unspecified: Secondary | ICD-10-CM | POA: Diagnosis not present

## 2022-10-14 DIAGNOSIS — Z6834 Body mass index (BMI) 34.0-34.9, adult: Secondary | ICD-10-CM | POA: Diagnosis not present

## 2022-10-14 DIAGNOSIS — Z791 Long term (current) use of non-steroidal anti-inflammatories (NSAID): Secondary | ICD-10-CM | POA: Diagnosis not present

## 2022-10-14 DIAGNOSIS — I10 Essential (primary) hypertension: Secondary | ICD-10-CM | POA: Diagnosis not present

## 2022-10-31 DIAGNOSIS — Z6834 Body mass index (BMI) 34.0-34.9, adult: Secondary | ICD-10-CM | POA: Diagnosis not present

## 2022-10-31 DIAGNOSIS — I10 Essential (primary) hypertension: Secondary | ICD-10-CM | POA: Diagnosis not present

## 2022-10-31 DIAGNOSIS — E669 Obesity, unspecified: Secondary | ICD-10-CM | POA: Diagnosis not present

## 2022-10-31 DIAGNOSIS — E785 Hyperlipidemia, unspecified: Secondary | ICD-10-CM | POA: Diagnosis not present

## 2022-10-31 DIAGNOSIS — R7303 Prediabetes: Secondary | ICD-10-CM | POA: Diagnosis not present

## 2022-10-31 DIAGNOSIS — G40909 Epilepsy, unspecified, not intractable, without status epilepticus: Secondary | ICD-10-CM | POA: Diagnosis not present

## 2022-10-31 DIAGNOSIS — Z791 Long term (current) use of non-steroidal anti-inflammatories (NSAID): Secondary | ICD-10-CM | POA: Diagnosis not present

## 2022-10-31 DIAGNOSIS — G4700 Insomnia, unspecified: Secondary | ICD-10-CM | POA: Diagnosis not present

## 2022-10-31 DIAGNOSIS — M1990 Unspecified osteoarthritis, unspecified site: Secondary | ICD-10-CM | POA: Diagnosis not present

## 2022-11-06 DIAGNOSIS — I10 Essential (primary) hypertension: Secondary | ICD-10-CM | POA: Diagnosis not present

## 2022-11-06 DIAGNOSIS — G4700 Insomnia, unspecified: Secondary | ICD-10-CM | POA: Diagnosis not present

## 2022-11-06 DIAGNOSIS — Z6834 Body mass index (BMI) 34.0-34.9, adult: Secondary | ICD-10-CM | POA: Diagnosis not present

## 2022-11-06 DIAGNOSIS — Z791 Long term (current) use of non-steroidal anti-inflammatories (NSAID): Secondary | ICD-10-CM | POA: Diagnosis not present

## 2022-11-06 DIAGNOSIS — R7303 Prediabetes: Secondary | ICD-10-CM | POA: Diagnosis not present

## 2022-11-06 DIAGNOSIS — E785 Hyperlipidemia, unspecified: Secondary | ICD-10-CM | POA: Diagnosis not present

## 2022-11-06 DIAGNOSIS — E669 Obesity, unspecified: Secondary | ICD-10-CM | POA: Diagnosis not present

## 2022-11-06 DIAGNOSIS — G40909 Epilepsy, unspecified, not intractable, without status epilepticus: Secondary | ICD-10-CM | POA: Diagnosis not present

## 2022-11-06 DIAGNOSIS — M1990 Unspecified osteoarthritis, unspecified site: Secondary | ICD-10-CM | POA: Diagnosis not present

## 2022-11-12 DIAGNOSIS — R7303 Prediabetes: Secondary | ICD-10-CM | POA: Diagnosis not present

## 2022-11-12 DIAGNOSIS — Z791 Long term (current) use of non-steroidal anti-inflammatories (NSAID): Secondary | ICD-10-CM | POA: Diagnosis not present

## 2022-11-12 DIAGNOSIS — I10 Essential (primary) hypertension: Secondary | ICD-10-CM | POA: Diagnosis not present

## 2022-11-12 DIAGNOSIS — M1990 Unspecified osteoarthritis, unspecified site: Secondary | ICD-10-CM | POA: Diagnosis not present

## 2022-11-12 DIAGNOSIS — E785 Hyperlipidemia, unspecified: Secondary | ICD-10-CM | POA: Diagnosis not present

## 2022-11-12 DIAGNOSIS — G4700 Insomnia, unspecified: Secondary | ICD-10-CM | POA: Diagnosis not present

## 2022-11-12 DIAGNOSIS — E669 Obesity, unspecified: Secondary | ICD-10-CM | POA: Diagnosis not present

## 2022-11-12 DIAGNOSIS — Z6834 Body mass index (BMI) 34.0-34.9, adult: Secondary | ICD-10-CM | POA: Diagnosis not present

## 2022-11-12 DIAGNOSIS — G40909 Epilepsy, unspecified, not intractable, without status epilepticus: Secondary | ICD-10-CM | POA: Diagnosis not present

## 2022-12-04 DIAGNOSIS — Z6834 Body mass index (BMI) 34.0-34.9, adult: Secondary | ICD-10-CM | POA: Diagnosis not present

## 2022-12-04 DIAGNOSIS — E785 Hyperlipidemia, unspecified: Secondary | ICD-10-CM | POA: Diagnosis not present

## 2022-12-04 DIAGNOSIS — E669 Obesity, unspecified: Secondary | ICD-10-CM | POA: Diagnosis not present

## 2022-12-04 DIAGNOSIS — I10 Essential (primary) hypertension: Secondary | ICD-10-CM | POA: Diagnosis not present

## 2022-12-04 DIAGNOSIS — M1990 Unspecified osteoarthritis, unspecified site: Secondary | ICD-10-CM | POA: Diagnosis not present

## 2022-12-04 DIAGNOSIS — G4700 Insomnia, unspecified: Secondary | ICD-10-CM | POA: Diagnosis not present

## 2022-12-04 DIAGNOSIS — G40909 Epilepsy, unspecified, not intractable, without status epilepticus: Secondary | ICD-10-CM | POA: Diagnosis not present

## 2022-12-04 DIAGNOSIS — Z791 Long term (current) use of non-steroidal anti-inflammatories (NSAID): Secondary | ICD-10-CM | POA: Diagnosis not present

## 2022-12-04 DIAGNOSIS — R7303 Prediabetes: Secondary | ICD-10-CM | POA: Diagnosis not present

## 2022-12-12 DIAGNOSIS — I10 Essential (primary) hypertension: Secondary | ICD-10-CM | POA: Diagnosis not present

## 2022-12-12 DIAGNOSIS — M1990 Unspecified osteoarthritis, unspecified site: Secondary | ICD-10-CM | POA: Diagnosis not present

## 2022-12-12 DIAGNOSIS — Z791 Long term (current) use of non-steroidal anti-inflammatories (NSAID): Secondary | ICD-10-CM | POA: Diagnosis not present

## 2022-12-12 DIAGNOSIS — Z6834 Body mass index (BMI) 34.0-34.9, adult: Secondary | ICD-10-CM | POA: Diagnosis not present

## 2022-12-12 DIAGNOSIS — E785 Hyperlipidemia, unspecified: Secondary | ICD-10-CM | POA: Diagnosis not present

## 2022-12-12 DIAGNOSIS — G40909 Epilepsy, unspecified, not intractable, without status epilepticus: Secondary | ICD-10-CM | POA: Diagnosis not present

## 2022-12-12 DIAGNOSIS — G4700 Insomnia, unspecified: Secondary | ICD-10-CM | POA: Diagnosis not present

## 2022-12-12 DIAGNOSIS — E669 Obesity, unspecified: Secondary | ICD-10-CM | POA: Diagnosis not present

## 2022-12-12 DIAGNOSIS — R7303 Prediabetes: Secondary | ICD-10-CM | POA: Diagnosis not present

## 2022-12-17 DIAGNOSIS — G40009 Localization-related (focal) (partial) idiopathic epilepsy and epileptic syndromes with seizures of localized onset, not intractable, without status epilepticus: Secondary | ICD-10-CM | POA: Diagnosis not present

## 2022-12-20 DIAGNOSIS — G40009 Localization-related (focal) (partial) idiopathic epilepsy and epileptic syndromes with seizures of localized onset, not intractable, without status epilepticus: Secondary | ICD-10-CM | POA: Diagnosis not present

## 2022-12-23 ENCOUNTER — Emergency Department (HOSPITAL_BASED_OUTPATIENT_CLINIC_OR_DEPARTMENT_OTHER)
Admission: EM | Admit: 2022-12-23 | Discharge: 2022-12-23 | Disposition: A | Discharge status: Home / Self Care | Payer: Medicare PPO | Attending: Emergency Medicine | Admitting: Emergency Medicine

## 2022-12-23 ENCOUNTER — Emergency Department (HOSPITAL_BASED_OUTPATIENT_CLINIC_OR_DEPARTMENT_OTHER): Payer: Medicare PPO

## 2022-12-23 ENCOUNTER — Ambulatory Visit
Admission: EM | Admit: 2022-12-23 | Discharge: 2022-12-23 | Disposition: A | Discharge status: Home / Self Care | Payer: Medicare PPO | Attending: Family Medicine | Admitting: Family Medicine

## 2022-12-23 ENCOUNTER — Other Ambulatory Visit: Payer: Self-pay

## 2022-12-23 ENCOUNTER — Encounter (HOSPITAL_BASED_OUTPATIENT_CLINIC_OR_DEPARTMENT_OTHER): Payer: Self-pay | Admitting: Urology

## 2022-12-23 ENCOUNTER — Encounter: Payer: Self-pay | Admitting: Emergency Medicine

## 2022-12-23 DIAGNOSIS — W010XXA Fall on same level from slipping, tripping and stumbling without subsequent striking against object, initial encounter: Secondary | ICD-10-CM | POA: Diagnosis not present

## 2022-12-23 DIAGNOSIS — S0101XA Laceration without foreign body of scalp, initial encounter: Secondary | ICD-10-CM

## 2022-12-23 DIAGNOSIS — S0990XA Unspecified injury of head, initial encounter: Secondary | ICD-10-CM

## 2022-12-23 DIAGNOSIS — S199XXA Unspecified injury of neck, initial encounter: Secondary | ICD-10-CM | POA: Diagnosis not present

## 2022-12-23 DIAGNOSIS — W19XXXA Unspecified fall, initial encounter: Secondary | ICD-10-CM

## 2022-12-23 DIAGNOSIS — D320 Benign neoplasm of cerebral meninges: Secondary | ICD-10-CM | POA: Diagnosis not present

## 2022-12-23 DIAGNOSIS — G9389 Other specified disorders of brain: Secondary | ICD-10-CM | POA: Diagnosis not present

## 2022-12-23 DIAGNOSIS — I10 Essential (primary) hypertension: Secondary | ICD-10-CM | POA: Diagnosis not present

## 2022-12-23 MED ORDER — LIDOCAINE-EPINEPHRINE-TETRACAINE (LET) TOPICAL GEL
3.0000 mL | Freq: Once | TOPICAL | Status: AC
  Administered 2022-12-23: 3 mL via TOPICAL
  Filled 2022-12-23: qty 3

## 2022-12-23 MED ORDER — LIDOCAINE-EPINEPHRINE (PF) 2 %-1:200000 IJ SOLN
10.0000 mL | Freq: Once | INTRAMUSCULAR | Status: AC
  Administered 2022-12-23: 10 mL
  Filled 2022-12-23: qty 20

## 2022-12-23 NOTE — ED Notes (Signed)
Patient is being discharged from the Urgent Care and sent to the Emergency Department via POV . Per Dallie Piles MD, patient is in need of higher level of care due to head injury. Patient is aware and verbalizes understanding of plan of care.  Vitals:   12/23/22 1515  BP: 107/68  Pulse: 65  Resp: 16  Temp: 98.2 F (36.8 C)  SpO2: 97%

## 2022-12-23 NOTE — Discharge Instructions (Addendum)
As discussed, workup today overall reassuring.  CT imaging negative for bleed or other abnormality from your fall.  You do have evidence of meningioma that may be slightly larger than last time.  Recommend follow-up with primary care as well as your neurologist or brain doctor for further assessment/evaluation.  Please do not hesitate to return to emergency department for worrisome signs and symptoms we discussed become apparent.

## 2022-12-23 NOTE — ED Provider Notes (Signed)
[facility]    CSN: [id number] Arrival date & time: 12/23/22  1431      History   Chief Complaint Chief Complaint  Patient presents with   Fall   Head Injury    HPI Deborah Gallegos is a 72 y.o. female.    Fall Associated symptoms include headaches.  Head Injury Associated symptoms: headache    Deborah Gallegos is a 72 y.o. female. 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  She tripped over the walker about 1 hr ago, and her head hit the corner of a bench at the organ.  No LOC.  She does have a headache, which is actually improved from previous.  No n/v.  No dizziness or light headedness.   She has a "gash" at the side of her head but not sure how big.  It was bleeding, but has since stopped.  She states she has been feeling "off" lately and has been seeing neurology.  She also has an apt with neuro later this month.  Her son brought her here today.        Past Medical History:  Diagnosis Date   Hypertension    Seizure (HCC)     There are no problems to display for this patient.   Past Surgical History:  Procedure Laterality Date   ABDOMINAL HYSTERECTOMY     BRAIN TUMOR EXCISION     CATARACT EXTRACTION     EYE SURGERY      OB History   No obstetric history on file.      Home Medications    Prior to Admission medications   Medication Sig Start Date End Date Taking? Authorizing Provider  amLODipine-benazepril (LOTREL) 5-20 MG capsule Take 1 capsule by mouth daily. 10/05/10  Yes [provider]  dapagliflozin propanediol (FARXIGA) 5 MG TABS tablet Take by mouth daily.   Yes [provider]  lamoTRIgine (LAMICTAL) 100 MG tablet Take 200-250 tablets by mouth 2 (two) times daily. 250 MG in the morning and 200 MG in the evening 10/01/16  Yes  [provider]  meloxicam (MOBIC) 15 MG tablet Take 15 mg by mouth daily.   Yes [provider]  rosuvastatin (CRESTOR) 10 MG tablet Take 10 mg by mouth daily.   Yes [provider]  BRIVIACT 100 MG TABS Take 1 tablet by mouth 2 (two) times daily. Patient not taking: Reported on 05/23/2022 10/02/16   [provider]  ibuprofen (ADVIL,MOTRIN) 400 MG tablet Take 1 tablet (400 mg total) by mouth every 6 (six) hours as needed. Patient not taking: Reported on 05/23/2022 04/24/17   Georgetta Haber, NP  meclizine (ANTIVERT) 50 MG tablet Take 1 tablet (50 mg total) by mouth 3 (three) times daily as needed. Patient not taking: Reported on 05/23/2022 12/27/12   Rolland Porter, MD    Family History Family History  Problem Relation Age of Onset   Hypertension Mother    Stroke Mother     Social History Social History   Tobacco Use   Smoking status: Never   Smokeless tobacco: Never  Vaping Use   Vaping status: Never Used  Substance Use Topics   Alcohol use: No   Drug use: Never     Allergies   Patient has no known allergies.   Review of Systems Review of Systems  Constitutional: Negative.   HENT: Negative.    Respiratory: Negative.    Cardiovascular: Negative.   Gastrointestinal: Negative.   Skin:  Positive for wound.  Neurological:  Positive for headaches.     Physical Exam Triage Vital Signs ED Triage Vitals  Encounter Vitals Group     BP 12/23/22 1515 107/68     Systolic BP Percentile --      Diastolic BP Percentile --      Pulse Rate 12/23/22 1515 65     Resp 12/23/22 1515 16     Temp 12/23/22 1515 98.2 F (36.8 C)     Temp Source 12/23/22 1515 Oral     SpO2 12/23/22 1515 97 %     Weight --      Height --      Head Circumference --      Peak Flow --      Pain Score 12/23/22 1518 2     Pain Loc --      Pain Education --      Exclude from Growth Chart --    No data found.  Updated Vital Signs BP 107/68 (BP Location: Left Arm)    Pulse 65   Temp 98.2 F (36.8 C) (Oral)   Resp 16   SpO2 97%   Visual Acuity Right Eye Distance:   Left Eye Distance:   Bilateral Distance:    Right Eye Near:   Left Eye Near:    Bilateral Near:     Physical Exam Constitutional:      Appearance: Normal appearance.  Cardiovascular:     Rate and Rhythm: Normal rate and regular rhythm.  Pulmonary:     Effort: Pulmonary effort is normal.     Breath sounds: Normal breath sounds.  Skin:    Comments: Dried and matted blood at the left occipital area;    Neurological:     General: No focal deficit present.     Mental Status: She is alert and oriented to person, place, and time.     Sensory: No sensory deficit.     Motor: No weakness.  Psychiatric:        Mood and Affect: Mood normal.        Behavior: Behavior normal.     UC Treatments / Results  Labs (all labs ordered are listed, but only abnormal results are displayed) Labs Reviewed - No data to display  EKG   Radiology No results found.  Procedures Procedures (including critical care time)  Medications Ordered in UC Medications - No data to display  Initial Impression / Assessment and Plan / UC Course  I have reviewed the triage vital signs and the nursing notes.  Pertinent labs & imaging results that were available during my care of the patient were reviewed by me and considered in my medical decision making (see chart for details).   Given the patients neurological history, her age, and mechanism of injury I have recommended that she go to the ER for further evaluation, and possible imaging.  She will have her laceration addressed at the ER at that time.   Final Clinical Impressions(s) / UC Diagnoses   Final diagnoses:  Injury of head, initial encounter  Laceration of occipital region of scalp, initial encounter  Fall, initial encounter     Discharge Instructions      You were seen today for a fall and head injury.  Given you history I have  recommended that you go to the ER for  further evaluation and testing.     ED Prescriptions   None    PDMP not reviewed this encounter.   Jannifer Franklin, MD 12/23/22 1539

## 2022-12-23 NOTE — Discharge Instructions (Addendum)
You were seen today for a fall and head injury.  Given you history I have recommended that you go to the ER for further evaluation and testing.

## 2022-12-23 NOTE — ED Provider Notes (Signed)
[place] EMERGENCY DEPARTMENT AT [facility] Provider Note   CSN: [id number] Arrival date & time: 12/23/22  1603     History  Chief Complaint  Patient presents with   Deborah Gallegos    Deborah Gallegos is a 72 y.o. female.   Fall   72 year old female presents to the emergency department with complaints of fall.  Patient states that she was walking in her house today with her walker when she tripped causing her to fall hitting the left back of her head on a nearby organ.  Denies any loss of consciousness or anticoagulation use.  Denies any visual disturbance, gait abnormality, slurred speech, facial droop, weakness/sensory deficits in upper lower extremities.  Has been able to ambulate at baseline with walker as assistance since then.  Denies pain elsewhere.  Incident occurred around 2 hours prior to arrival.  Past medical history significant for hypertension, seizure, sphenoid wing meningioma with craniotomy in 2002 and radiation in 2011, carotid territory stroke and perioperative.  With resultant seizure  Home Medications Prior to Admission medications   Medication Sig Start Date End Date Taking? Authorizing Provider  amLODipine-benazepril (LOTREL) 5-20 MG capsule Take 1 capsule by mouth daily. 10/05/10   [provider]  BRIVIACT 100 MG TABS Take 1 tablet by mouth 2 (two) times daily. Patient not taking: Reported on 05/23/2022 10/02/16   [provider]  dapagliflozin propanediol (FARXIGA) 5 MG TABS tablet Take by mouth daily.    [provider]  ibuprofen (ADVIL,MOTRIN) 400 MG tablet Take 1 tablet (400 mg total) by mouth every 6 (six) hours as needed. Patient not taking: Reported on 05/23/2022 04/24/17   Linus Mako B, NP  lamoTRIgine (LAMICTAL) 100 MG tablet Take 200-250 tablets by mouth 2 (two) times daily. 250 MG in the morning and 200 MG in the evening 10/01/16   [provider]  meclizine (ANTIVERT) 50 MG tablet Take 1 tablet (50 mg total) by  mouth 3 (three) times daily as needed. Patient not taking: Reported on 05/23/2022 12/27/12   Rolland Porter, MD  meloxicam (MOBIC) 15 MG tablet Take 15 mg by mouth daily.    [provider]  rosuvastatin (CRESTOR) 10 MG tablet Take 10 mg by mouth daily.    [provider]      Allergies    Patient has no known allergies.    Review of Systems   Review of Systems  All other systems reviewed and are negative.   Physical Exam Updated Vital Signs BP 111/66   Pulse (!) 57   Temp 98.4 F (36.9 C) (Oral)   Resp 14   Ht 5\' 4"  (1.626 m)   Wt 96.2 kg   SpO2 100%   BMI 36.40 kg/m  Physical Exam Vitals and nursing note reviewed.  Constitutional:      General: She is not in acute distress.    Appearance: She is well-developed.  HENT:     Head: Normocephalic.     Comments: 1-1.5 superficial abrasion appreciated on the left occipital region. Eyes:     Conjunctiva/sclera: Conjunctivae normal.  Cardiovascular:     Rate and Rhythm: Normal rate and regular rhythm.  Pulmonary:     Effort: Pulmonary effort is normal. No respiratory distress.     Breath sounds: Normal breath sounds. No wheezing, rhonchi or rales.  Abdominal:     Palpations: Abdomen is soft.     Tenderness: There is no abdominal tenderness.  Musculoskeletal:        General:  No swelling.     Cervical back: Neck supple.     Comments: No midline tenderness of cervical, thoracic, lumbar spine without step-off or deformity noted.  No chest wall tenderness.  No obvious tenderness of upper or lower extremities.  Skin:    General: Skin is warm and dry.     Capillary Refill: Capillary refill takes less than 2 seconds.  Neurological:     Mental Status: She is alert.     Comments: Alert and oriented to self, place, time and event.   Speech is fluent, clear without dysarthria or dysphasia.   Strength symmetric in upper/lower extremities   Sensation intact in upper/lower extremities   Normal gait.  Negative  Romberg. No pronator drift.  Normal finger-to-nose and feet tapping.  CN I not tested  CN II not tested CN III, IV, VI PERRLA and EOMs intact bilaterally  CN V Intact sensation to sharp and light touch to the face  CN VII facial movements symmetric  CN VIII not tested  CN IX, X no uvula deviation, symmetric rise of soft palate  CN XI symmetric SCM and trapezius strength bilaterally  CN XII Midline tongue protrusion, symmetric L/R movements     Psychiatric:        Mood and Affect: Mood normal.     ED Results / Procedures / Treatments   Labs (all labs ordered are listed, but only abnormal results are displayed) Labs Reviewed - No data to display  EKG None  Radiology CT Head Wo Contrast  Result Date: 12/23/2022 CLINICAL DATA:  Head trauma, minor (Age >= 65y); Neck trauma (Age >= 65y). Fall today with head strike. EXAM: CT HEAD WITHOUT CONTRAST CT CERVICAL SPINE WITHOUT CONTRAST TECHNIQUE: Multidetector CT imaging of the head and cervical spine was performed following the standard protocol without intravenous contrast. Multiplanar CT image reconstructions of the cervical spine were also generated. RADIATION DOSE REDUCTION: This exam was performed according to the departmental dose-optimization program which includes automated exposure control, adjustment of the mA and/or kV according to patient size and/or use of iterative reconstruction technique. COMPARISON:  CT head and cervical spine 10/06/2021. FINDINGS: CT HEAD FINDINGS Brain: No acute hemorrhage. Stable postoperative changes of right pterional craniotomy for debulking of the meningioma along the right sphenoid wing. Unchanged underlying encephalomalacia along the anterior right temporal lobe and right frontal operculum. No significant change in size of the dominant residual, partially calcified mass centered along the right sphenoid wing, measuring up to 38 mm (axial image 8 series 2). Slightly increased size of the right anterior  parafalcine meningioma, now measuring up to 13 mm (axial image 9 series 2), previously 11 mm. No new loss of gray-white differentiation. No hydrocephalus or extra-axial collection. Vascular: No hyperdense vessel or unexpected calcification. Skull: No calvarial fracture. Prior right pterional craniotomy. Reactive hyperostosis along the right sphenoid wing. Sinuses/Orbits: No acute finding. Other: None. CT CERVICAL SPINE FINDINGS Alignment: Normal. Skull base and vertebrae: No acute fracture. Normal craniocervical junction. No suspicious bone lesions. Soft tissues and spinal canal: No prevertebral fluid or swelling. No visible canal hematoma. Disc levels: Unchanged cervical spondylosis, worst at C5-6 where ossification of the posterior longitudinal ligament and disc bulge results in no more than mild spinal canal stenosis. Upper chest: No acute findings. Other: None. IMPRESSION: 1. No acute intracranial abnormality. 2. Stable postoperative changes of right pterional craniotomy for debulking of the meningioma along the right sphenoid wing. No significant change in size of the dominant residual, partially calcified mass  centered along the right sphenoid wing, measuring up to 38 mm. 3. Slightly increased size of the right anterior parafalcine meningioma, now measuring up to 13 mm, previously 11 mm. 4. No acute cervical spine fracture or traumatic listhesis. Electronically Signed   By: Orvan Falconer M.D.   On: 12/23/2022 18:01   CT Cervical Spine Wo Contrast  Result Date: 12/23/2022 CLINICAL DATA:  Head trauma, minor (Age >= 65y); Neck trauma (Age >= 65y). Fall today with head strike. EXAM: CT HEAD WITHOUT CONTRAST CT CERVICAL SPINE WITHOUT CONTRAST TECHNIQUE: Multidetector CT imaging of the head and cervical spine was performed following the standard protocol without intravenous contrast. Multiplanar CT image reconstructions of the cervical spine were also generated. RADIATION DOSE REDUCTION: This exam was  performed according to the departmental dose-optimization program which includes automated exposure control, adjustment of the mA and/or kV according to patient size and/or use of iterative reconstruction technique. COMPARISON:  CT head and cervical spine 10/06/2021. FINDINGS: CT HEAD FINDINGS Brain: No acute hemorrhage. Stable postoperative changes of right pterional craniotomy for debulking of the meningioma along the right sphenoid wing. Unchanged underlying encephalomalacia along the anterior right temporal lobe and right frontal operculum. No significant change in size of the dominant residual, partially calcified mass centered along the right sphenoid wing, measuring up to 38 mm (axial image 8 series 2). Slightly increased size of the right anterior parafalcine meningioma, now measuring up to 13 mm (axial image 9 series 2), previously 11 mm. No new loss of gray-white differentiation. No hydrocephalus or extra-axial collection. Vascular: No hyperdense vessel or unexpected calcification. Skull: No calvarial fracture. Prior right pterional craniotomy. Reactive hyperostosis along the right sphenoid wing. Sinuses/Orbits: No acute finding. Other: None. CT CERVICAL SPINE FINDINGS Alignment: Normal. Skull base and vertebrae: No acute fracture. Normal craniocervical junction. No suspicious bone lesions. Soft tissues and spinal canal: No prevertebral fluid or swelling. No visible canal hematoma. Disc levels: Unchanged cervical spondylosis, worst at C5-6 where ossification of the posterior longitudinal ligament and disc bulge results in no more than mild spinal canal stenosis. Upper chest: No acute findings. Other: None. IMPRESSION: 1. No acute intracranial abnormality. 2. Stable postoperative changes of right pterional craniotomy for debulking of the meningioma along the right sphenoid wing. No significant change in size of the dominant residual, partially calcified mass centered along the right sphenoid wing, measuring  up to 38 mm. 3. Slightly increased size of the right anterior parafalcine meningioma, now measuring up to 13 mm, previously 11 mm. 4. No acute cervical spine fracture or traumatic listhesis. Electronically Signed   By: Orvan Falconer M.D.   On: 12/23/2022 18:01    Procedures .Marland KitchenLaceration Repair  Date/Time: 12/23/2022 6:09 PM  Performed by: Peter Garter, PA Authorized by: Peter Garter, PA   Consent:    Consent obtained:  Verbal   Consent given by:  Patient   Risks, benefits, and alternatives were discussed: yes     Risks discussed:  Infection, need for additional repair, pain, poor cosmetic result, poor wound healing and retained foreign body   Alternatives discussed:  Delayed treatment, no treatment, observation and referral Universal protocol:    Procedure explained and questions answered to patient or proxy's satisfaction: yes     Patient identity confirmed:  Verbally with patient Anesthesia:    Anesthesia method:  Topical application   Topical anesthetic:  LET Laceration details:    Location:  Scalp   Scalp location:  Occipital   Length (cm):  1.5 Pre-procedure  details:    Preparation:  Patient was prepped and draped in usual sterile fashion and imaging obtained to evaluate for foreign bodies Exploration:    Limited defect created (wound extended): no     Hemostasis achieved with:  Direct pressure   Imaging obtained: x-ray     Imaging outcome: foreign body not noted     Wound exploration: wound explored through full range of motion and entire depth of wound visualized     Contaminated: no   Treatment:    Area cleansed with:  Saline   Amount of cleaning:  Standard   Irrigation solution:  Sterile water   Irrigation volume:  100   Irrigation method:  Syringe   Visualized foreign bodies/material removed: no     Undermining:  None   Scar revision: no   Skin repair:    Repair method:  Tissue adhesive Approximation:    Approximation:  Close Repair type:     Repair type:  Simple Post-procedure details:    Dressing:  Open (no dressing)   Procedure completion:  Tolerated well, no immediate complications     Medications Ordered in ED Medications  lidocaine-EPINEPHrine-tetracaine (LET) topical gel (3 mLs Topical Given 12/23/22 1655)  lidocaine-EPINEPHrine (XYLOCAINE W/EPI) 2 %-1:200000 (PF) injection 10 mL (10 mLs Infiltration Given 12/23/22 1656)    ED Course/ Medical Decision Making/ A&P                                 Medical Decision Making Amount and/or Complexity of Data Reviewed Radiology: ordered.  Risk Prescription drug management.   This patient presents to the ED for concern of fall, this involves an extensive number of treatment options, and is a complaint that carries with it a high risk of complications and morbidity.  The differential diagnosis includes fracture, strain/pain, dislocation, CVA, other   Co morbidities that complicate the patient evaluation  See HPI   Additional history obtained:  Additional history obtained from EMR External records from outside source obtained and reviewed including hospital records   Lab Tests:  N/a   Imaging Studies ordered:  I ordered imaging studies including CT head/cervical spine I independently visualized and interpreted imaging which showed no acute intracranial abnormality.  No acute cervical spine injury or traumatic listhesis.  Stable postop changes right craniotomy.  Increased size of right anterior parafalcine meningioma I agree with the radiologist interpretation  Cardiac Monitoring: / EKG:  The patient was maintained on a cardiac monitor.  I personally viewed and interpreted the cardiac monitored which showed an underlying rhythm of: Sinus rhythm   Consultations Obtained:  N/a   Problem List / ED Course / Critical interventions / Medication management  Fall, scalp laceration I ordered medication including lidocaine with epinephrine, let   Reevaluation of the patient after these medicines showed that the patient improved I have reviewed the patients home medicines and have made adjustments as needed   Social Determinants of Health:  Denies tobacco, illicit drug use   Test / Admission - Considered:  Fall, scalp laceration Vitals signs within normal range and stable throughout visit. Imaging studies significant for: See above 72 year old female presents emergency department with complaints of fall.  Fall mechanical in nature as she tripped over a nearby object when she was walking in her house.  On exam, patient without any focal neurologic deficits but given age and head injury, CT imaging was obtained which was negative for any  acute abnormality.  CT imaging did show slightly enlarged meningioma from prior CT in August of this year.  Patient has no neurologic deficits, continued headache, seizure-like activity.  Given slightly increased in size, offered patient talking to her neurologist or someone within their practice to ensure close follow-up from the ED for slightly increased size of meningioma but she declined in favor of calling herself.  Will recommend close follow-up with neurology in the outpatient setting as well as primary care for further assessment/evaluation.  Small laceration repaired in manner as above.  Further workup deemed necessary at this time on the ED.  Treatment plan discussed at length with patient and she acknowledged understanding was agreeable to said plan.  Patient overall well-appearing, febrile in no acute distress. Worrisome signs and symptoms were discussed with the patient, and the patient acknowledged understanding to return to the ED if noticed. Patient was stable upon discharge.          Final Clinical Impression(s) / ED Diagnoses Final diagnoses:  Fall, initial encounter  Injury of head, initial encounter    Rx / DC Orders ED Discharge Orders     None         Peter Garter, [place] 12/23/22 1812    Anders Simmonds T, DO 12/24/22 1643

## 2022-12-23 NOTE — ED Triage Notes (Signed)
Tripped over walker 1 hour ago. Hit head on the legs of the instrumental organ. States her head immediately started throbbing, denies LOC, states her son had to help her up. Reports the son noticed that she had a gash in the back of her head. Denies the use of blood thinners, denies any changes to vision. Reports a current headache. Visible laceration to scalp, difficult to discern size or shape d/t dried blood in hair currently.

## 2022-12-23 NOTE — ED Triage Notes (Signed)
Pt sent here form [facility] for CT scan States fall today (mechanical)  Small lac to posterior scalp   Denies any other injuries , TDAP UTD   No blood thinners

## 2022-12-23 NOTE — ED Notes (Signed)
Patient transported to CT 

## 2022-12-26 DIAGNOSIS — E7800 Pure hypercholesterolemia, unspecified: Secondary | ICD-10-CM | POA: Diagnosis not present

## 2022-12-26 DIAGNOSIS — G40001 Localization-related (focal) (partial) idiopathic epilepsy and epileptic syndromes with seizures of localized onset, not intractable, with status epilepticus: Secondary | ICD-10-CM | POA: Diagnosis not present

## 2022-12-26 DIAGNOSIS — R7982 Elevated C-reactive protein (CRP): Secondary | ICD-10-CM | POA: Diagnosis not present

## 2022-12-26 DIAGNOSIS — Z7409 Other reduced mobility: Secondary | ICD-10-CM | POA: Diagnosis not present

## 2022-12-26 DIAGNOSIS — E1169 Type 2 diabetes mellitus with other specified complication: Secondary | ICD-10-CM | POA: Diagnosis not present

## 2023-01-01 DIAGNOSIS — Z923 Personal history of irradiation: Secondary | ICD-10-CM | POA: Diagnosis not present

## 2023-01-01 DIAGNOSIS — R448 Other symptoms and signs involving general sensations and perceptions: Secondary | ICD-10-CM | POA: Diagnosis not present

## 2023-01-01 DIAGNOSIS — R9401 Abnormal electroencephalogram [EEG]: Secondary | ICD-10-CM | POA: Diagnosis not present

## 2023-01-01 DIAGNOSIS — G9389 Other specified disorders of brain: Secondary | ICD-10-CM | POA: Diagnosis not present

## 2023-01-01 DIAGNOSIS — G40109 Localization-related (focal) (partial) symptomatic epilepsy and epileptic syndromes with simple partial seizures, not intractable, without status epilepticus: Secondary | ICD-10-CM | POA: Diagnosis not present

## 2023-01-01 DIAGNOSIS — Z86011 Personal history of benign neoplasm of the brain: Secondary | ICD-10-CM | POA: Diagnosis not present

## 2023-01-29 DIAGNOSIS — H903 Sensorineural hearing loss, bilateral: Secondary | ICD-10-CM | POA: Diagnosis not present

## 2023-02-21 DIAGNOSIS — G40001 Localization-related (focal) (partial) idiopathic epilepsy and epileptic syndromes with seizures of localized onset, not intractable, with status epilepticus: Secondary | ICD-10-CM | POA: Diagnosis not present

## 2023-02-21 DIAGNOSIS — R296 Repeated falls: Secondary | ICD-10-CM | POA: Diagnosis not present

## 2023-02-21 DIAGNOSIS — H900 Conductive hearing loss, bilateral: Secondary | ICD-10-CM | POA: Diagnosis not present

## 2023-02-21 DIAGNOSIS — E785 Hyperlipidemia, unspecified: Secondary | ICD-10-CM | POA: Diagnosis not present

## 2023-02-21 DIAGNOSIS — I10 Essential (primary) hypertension: Secondary | ICD-10-CM | POA: Diagnosis not present

## 2023-06-10 DIAGNOSIS — R7309 Other abnormal glucose: Secondary | ICD-10-CM | POA: Diagnosis not present

## 2023-06-10 DIAGNOSIS — G40009 Localization-related (focal) (partial) idiopathic epilepsy and epileptic syndromes with seizures of localized onset, not intractable, without status epilepticus: Secondary | ICD-10-CM | POA: Diagnosis not present

## 2023-06-10 DIAGNOSIS — E6609 Other obesity due to excess calories: Secondary | ICD-10-CM | POA: Diagnosis not present

## 2023-06-10 DIAGNOSIS — E7800 Pure hypercholesterolemia, unspecified: Secondary | ICD-10-CM | POA: Diagnosis not present

## 2023-06-10 DIAGNOSIS — I10 Essential (primary) hypertension: Secondary | ICD-10-CM | POA: Diagnosis not present

## 2023-07-08 DIAGNOSIS — R7309 Other abnormal glucose: Secondary | ICD-10-CM | POA: Diagnosis not present

## 2023-07-08 DIAGNOSIS — G40001 Localization-related (focal) (partial) idiopathic epilepsy and epileptic syndromes with seizures of localized onset, not intractable, with status epilepticus: Secondary | ICD-10-CM | POA: Diagnosis not present

## 2023-07-08 DIAGNOSIS — E7849 Other hyperlipidemia: Secondary | ICD-10-CM | POA: Diagnosis not present

## 2023-07-08 DIAGNOSIS — M130 Polyarthritis, unspecified: Secondary | ICD-10-CM | POA: Diagnosis not present

## 2023-07-08 DIAGNOSIS — E1169 Type 2 diabetes mellitus with other specified complication: Secondary | ICD-10-CM | POA: Diagnosis not present

## 2023-07-08 DIAGNOSIS — I10 Essential (primary) hypertension: Secondary | ICD-10-CM | POA: Diagnosis not present

## 2023-08-03 ENCOUNTER — Encounter (HOSPITAL_BASED_OUTPATIENT_CLINIC_OR_DEPARTMENT_OTHER): Payer: Self-pay

## 2023-08-03 ENCOUNTER — Emergency Department (HOSPITAL_BASED_OUTPATIENT_CLINIC_OR_DEPARTMENT_OTHER)
Admission: EM | Admit: 2023-08-03 | Discharge: 2023-08-03 | Disposition: A | Discharge status: Home / Self Care | Attending: Emergency Medicine | Admitting: Emergency Medicine

## 2023-08-03 ENCOUNTER — Other Ambulatory Visit: Payer: Self-pay

## 2023-08-03 ENCOUNTER — Emergency Department (HOSPITAL_BASED_OUTPATIENT_CLINIC_OR_DEPARTMENT_OTHER)

## 2023-08-03 DIAGNOSIS — W1830XA Fall on same level, unspecified, initial encounter: Secondary | ICD-10-CM | POA: Insufficient documentation

## 2023-08-03 DIAGNOSIS — W19XXXA Unspecified fall, initial encounter: Secondary | ICD-10-CM

## 2023-08-03 DIAGNOSIS — M79645 Pain in left finger(s): Secondary | ICD-10-CM | POA: Diagnosis not present

## 2023-08-03 DIAGNOSIS — S6992XA Unspecified injury of left wrist, hand and finger(s), initial encounter: Secondary | ICD-10-CM | POA: Insufficient documentation

## 2023-08-03 DIAGNOSIS — S0003XA Contusion of scalp, initial encounter: Secondary | ICD-10-CM | POA: Diagnosis not present

## 2023-08-03 DIAGNOSIS — S62512A Displaced fracture of proximal phalanx of left thumb, initial encounter for closed fracture: Secondary | ICD-10-CM | POA: Insufficient documentation

## 2023-08-03 DIAGNOSIS — R519 Headache, unspecified: Secondary | ICD-10-CM | POA: Diagnosis not present

## 2023-08-03 DIAGNOSIS — S0990XA Unspecified injury of head, initial encounter: Secondary | ICD-10-CM | POA: Diagnosis present

## 2023-08-03 DIAGNOSIS — D320 Benign neoplasm of cerebral meninges: Secondary | ICD-10-CM | POA: Diagnosis not present

## 2023-08-03 DIAGNOSIS — G9389 Other specified disorders of brain: Secondary | ICD-10-CM | POA: Diagnosis not present

## 2023-08-03 MED ORDER — ACETAMINOPHEN 500 MG PO TABS
1000.0000 mg | ORAL_TABLET | Freq: Once | ORAL | Status: AC
Start: 2023-08-03 — End: 2023-08-03
  Administered 2023-08-03: 1000 mg via ORAL
  Filled 2023-08-03: qty 2

## 2023-08-03 MED ORDER — OXYCODONE HCL 5 MG PO TABS
5.0000 mg | ORAL_TABLET | Freq: Once | ORAL | Status: AC
  Administered 2023-08-03: 5 mg via ORAL
  Filled 2023-08-03: qty 1

## 2023-08-03 NOTE — ED Triage Notes (Signed)
 Pt reports mechanical fall this am. Reports that she hit her head on the floor when she fell. Denies LOC. No thinners. Also reports injury to left thumb. Denies ant other injuries

## 2023-08-03 NOTE — ED Notes (Signed)
 ED Provider at bedside.

## 2023-08-03 NOTE — ED Provider Notes (Signed)
 [place] [facility] AT [facility] Provider Note   CSN: [id number] Arrival date & time: 08/03/23  4098     History  Chief Complaint  Patient presents with   Deborah Gallegos    Deborah Gallegos is a 73 y.o. female.  73 yo F with a chief complaint of left thumb pain after fall.  Patient had lost her balance and she thinks that she tried to grab onto something and hurt her left thumb.  She also struck her head.  She denies other injury denies pain to the neck denies pain to the chest to the back of the abdomen.  She has been able to walk since.     Fall       Home Medications Prior to Admission medications   Medication Sig Start Date End Date Taking? Authorizing Provider  amLODipine-benazepril (LOTREL) 5-20 MG capsule Take 1 capsule by mouth daily. 10/05/10   [provider]  BRIVIACT 100 MG TABS Take 1 tablet by mouth 2 (two) times daily. Patient not taking: Reported on 05/23/2022 10/02/16   [provider]  dapagliflozin propanediol (FARXIGA) 5 MG TABS tablet Take by mouth daily.    [provider]  ibuprofen  (ADVIL ,MOTRIN ) 400 MG tablet Take 1 tablet (400 mg total) by mouth every 6 (six) hours as needed. Patient not taking: Reported on 05/23/2022 04/24/17   Burky, Natalie B, NP  lamoTRIgine  (LAMICTAL ) 100 MG tablet Take 200-250 tablets by mouth 2 (two) times daily. 250 MG in the morning and 200 MG in the evening 10/01/16   [provider]  meclizine  (ANTIVERT ) 50 MG tablet Take 1 tablet (50 mg total) by mouth 3 (three) times daily as needed. Patient not taking: Reported on 05/23/2022 12/27/12   Eino Gravel, MD  meloxicam (MOBIC) 15 MG tablet Take 15 mg by mouth daily.    [provider]  rosuvastatin (CRESTOR) 10 MG tablet Take 10 mg by mouth daily.    [provider]      Allergies    Patient has no known allergies.    Review of Systems   Review of Systems  Physical Exam Updated Vital Signs BP 133/61 (BP  Location: Left Arm)   Pulse (!) 58   Temp 97.9 F (36.6 C) (Oral)   Resp 18   Ht 5\' 3"  (1.6 m)   Wt 88 kg   SpO2 97%   BMI 34.37 kg/m  Physical Exam Vitals and nursing note reviewed.  Constitutional:      General: She is not in acute distress.    Appearance: She is well-developed. She is not diaphoretic.  HENT:     Head: Normocephalic.     Comments: Right frontal hematoma. Eyes:     Pupils: Pupils are equal, round, and reactive to light.  Cardiovascular:     Rate and Rhythm: Normal rate and regular rhythm.     Heart sounds: No murmur heard.    No friction rub. No gallop.  Pulmonary:     Effort: Pulmonary effort is normal.     Breath sounds: No wheezing or rales.  Abdominal:     General: There is no distension.     Palpations: Abdomen is soft.     Tenderness: There is no abdominal tenderness.  Musculoskeletal:        General: Swelling and tenderness present.     Cervical back: Normal range of motion and neck supple.     Comments: Pain and swelling to the base of the  left thumb.  Pain limits range of motion.  Palpated from head to toe without any other obvious noted areas of bony tenderness.  Skin:    General: Skin is warm and dry.  Neurological:     Mental Status: She is alert and oriented to person, place, and time.  Psychiatric:        Behavior: Behavior normal.     ED Results / Procedures / Treatments   Labs (all labs ordered are listed, but only abnormal results are displayed) Labs Reviewed - No data to display  EKG None  Radiology CT Head Wo Contrast Result Date: 08/03/2023 CLINICAL DATA:  73 year old female status post fall with pain. History of previous right side craniotomy and skull base meningioma. EXAM: CT HEAD WITHOUT CONTRAST TECHNIQUE: Contiguous axial images were obtained from the base of the skull through the vertex without intravenous contrast. RADIATION DOSE REDUCTION: This exam was performed according to the departmental dose-optimization  program which includes automated exposure control, adjustment of the mA and/or kV according to patient size and/or use of iterative reconstruction technique. COMPARISON:  Head CT 12/23/2022 and earlier. FINDINGS: Brain: Large chronic right central skull base calcified meningioma. Chronic involvement of the right cavernous sinus. Hyperdense tumor with intracranial component roughly 4.1 cm long axis appears stable from last year (coronal image 38. Superimposed right temporal lobe, inferior frontal gyrus encephalomalacia. Superimposed separate and smaller 18 mm right anterior para falcine meningioma (series 6, image 12), slightly larger since last year. No regional edema. No midline shift or acute intracranial mass effect. Stable ventricle size and configuration. No acute intracranial hemorrhage identified. No cortically based acute infarct identified. Stable gray-white matter differentiation throughout the brain. Vascular: Calcified atherosclerosis at the skull base. No suspicious intracranial vascular hyperdensity. Skull: Stable, chronic right frontotemporal craniotomy and chronic right middle cranial fossa hyperostosis. Sinuses/Orbits: Visualized paranasal sinuses and mastoids are stable and well aerated. Other: No acute orbit or scalp soft tissue finding identified. IMPRESSION: 1. No acute intracranial abnormality or acute traumatic injury identified. 2. 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 Stable superimposed previous craniotomy, encephalomalacia. Electronically Signed   By: Marlise Simpers M.D.   On: 08/03/2023 10:59   DG Hand Complete Left Result Date: 08/03/2023 CLINICAL DATA:  73 year old female status post fall with thumb pain. EXAM: LEFT HAND - COMPLETE 3+ VIEW COMPARISON:  Left wrist series 04/11/2018. FINDINGS: Three views. Stable bone mineralization. Distal radius and ulna, carpal bones appear stable, intact, aligned. Metacarpals appears stable and intact, possible  healed chronic 5th metacarpal fracture. Oblique minimally displaced extra-articular fracture through the proximal 3rd of the left thumb proximal phalanx. Superimposed chronic ossific fragment at the dorsal distal thumb phalanx and joint space loss there appears stable since 2020. Other phalanges appear intact. Other DIP osteoarthritis. IMPRESSION: 1. Acute minimally displaced extra-articular fracture of the left thumb proximal phalanx. 2. No other acute fracture or dislocation identified about the left hand. Electronically Signed   By: Marlise Simpers M.D.   On: 08/03/2023 10:38    Procedures Procedures    Medications Ordered in ED Medications  acetaminophen  (TYLENOL ) tablet 1,000 mg (1,000 mg Oral Given 08/03/23 1107)  oxyCODONE (Oxy IR/ROXICODONE) immediate release tablet 5 mg (5 mg Oral Given 08/03/23 1107)    ED Course/ Medical Decision Making/ A&P                                 Medical  Decision Making Amount and/or Complexity of Data Reviewed Radiology: ordered.  Risk OTC drugs. Prescription drug management.   73 yo F with a chief complaints of left thumb pain.  Plain film of the left hand independently interpreted by me with a proximal phalanx fracture of the left first digit.  Placed into a thumb spica.  Hand follow-up.  Patient also has signs that she struck her head.  I discussed concern for intracranial hemorrhage in someone with more than 65 birthdays.  CT imaging obtained on my independent to rotation without obvious intracranial hemorrhage.  Discharged home.  PCP follow-up.  11:41 AM:  I have discussed the diagnosis/risks/treatment options with the patient.  Evaluation and diagnostic testing in the emergency department does not suggest an emergent condition requiring admission or immediate intervention beyond what has been performed at this time.  They will follow up with PCP, hand. We also discussed returning to the ED immediately if new or worsening sx occur. We discussed the sx  which are most concerning (e.g., sudden worsening pain, fever, inability to tolerate by mouth) that necessitate immediate return. Medications administered to the patient during their visit and any new prescriptions provided to the patient are listed below.  Medications given during this visit Medications  acetaminophen  (TYLENOL ) tablet 1,000 mg (1,000 mg Oral Given 08/03/23 1107)  oxyCODONE (Oxy IR/ROXICODONE) immediate release tablet 5 mg (5 mg Oral Given 08/03/23 1107)     The patient appears reasonably screen and/or stabilized for discharge and I doubt any other medical condition or other [facility] requiring further screening, evaluation, or treatment in the ED at this time prior to discharge.          Final Clinical Impression(s) / ED Diagnoses Final diagnoses:  Fall, initial encounter  Hematoma of frontal scalp, initial encounter    Rx / DC Orders ED Discharge Orders     None         Albertus Hughs, DO 08/03/23 1141

## 2023-08-03 NOTE — Discharge Instructions (Addendum)
 Follow up with your ortho doc in the office.    Your CT of your head looks okay.  Please return for sudden worsening headache confusion or vomiting.

## 2023-08-04 DIAGNOSIS — S62601A Fracture of unspecified phalanx of left index finger, initial encounter for closed fracture: Secondary | ICD-10-CM | POA: Diagnosis not present

## 2023-08-06 DIAGNOSIS — S62515A Nondisplaced fracture of proximal phalanx of left thumb, initial encounter for closed fracture: Secondary | ICD-10-CM | POA: Diagnosis not present

## 2023-08-24 ENCOUNTER — Emergency Department (HOSPITAL_BASED_OUTPATIENT_CLINIC_OR_DEPARTMENT_OTHER): Admission: EM | Admit: 2023-08-24 | Discharge: 2023-08-24 | Disposition: A | Discharge status: Home / Self Care

## 2023-08-24 ENCOUNTER — Other Ambulatory Visit: Payer: Self-pay

## 2023-08-24 ENCOUNTER — Encounter (HOSPITAL_BASED_OUTPATIENT_CLINIC_OR_DEPARTMENT_OTHER): Payer: Self-pay | Admitting: Emergency Medicine

## 2023-08-24 ENCOUNTER — Emergency Department (HOSPITAL_BASED_OUTPATIENT_CLINIC_OR_DEPARTMENT_OTHER)

## 2023-08-24 DIAGNOSIS — Z23 Encounter for immunization: Secondary | ICD-10-CM | POA: Diagnosis not present

## 2023-08-24 DIAGNOSIS — S0101XA Laceration without foreign body of scalp, initial encounter: Secondary | ICD-10-CM | POA: Diagnosis not present

## 2023-08-24 DIAGNOSIS — D320 Benign neoplasm of cerebral meninges: Secondary | ICD-10-CM | POA: Diagnosis not present

## 2023-08-24 DIAGNOSIS — S0990XA Unspecified injury of head, initial encounter: Secondary | ICD-10-CM | POA: Diagnosis not present

## 2023-08-24 DIAGNOSIS — Z79899 Other long term (current) drug therapy: Secondary | ICD-10-CM | POA: Insufficient documentation

## 2023-08-24 DIAGNOSIS — W01190A Fall on same level from slipping, tripping and stumbling with subsequent striking against furniture, initial encounter: Secondary | ICD-10-CM | POA: Insufficient documentation

## 2023-08-24 DIAGNOSIS — G9389 Other specified disorders of brain: Secondary | ICD-10-CM | POA: Diagnosis not present

## 2023-08-24 DIAGNOSIS — I10 Essential (primary) hypertension: Secondary | ICD-10-CM | POA: Diagnosis not present

## 2023-08-24 MED ORDER — TETANUS-DIPHTH-ACELL PERTUSSIS 5-2.5-18.5 LF-MCG/0.5 IM SUSY
0.5000 mL | PREFILLED_SYRINGE | Freq: Once | INTRAMUSCULAR | Status: AC
  Administered 2023-08-24: 0.5 mL via INTRAMUSCULAR
  Filled 2023-08-24: qty 0.5

## 2023-08-24 NOTE — ED Notes (Signed)
 FALL RISK BUNDLE IS CURRENTLY IN PLACE

## 2023-08-24 NOTE — ED Triage Notes (Signed)
 Pt missed sitting in chair ,hit the back of her head on wooden chair. No LOC, no neuro changes

## 2023-08-24 NOTE — ED Notes (Addendum)
 Assisted with triaging pt at request of RN Tillman, assisted with putting a gown on, fall risk bracelet and VS.

## 2023-08-24 NOTE — Discharge Instructions (Signed)
 Return 7 to 10 days to get staples removed.  Do not shower for 24 hours.  May shower per usual with mild soap and water.  Do not scrub staples.  Do not submerge yourself in water for 2 weeks.  Return immediately if develop severe headache, fevers, wound begins draining pus, foul odor or you develop any new or worsening symptoms that are concerning to you.  You may take over-the-counter medication such as Tylenol  for pain.

## 2023-08-24 NOTE — ED Provider Notes (Signed)
 [place] EMERGENCY DEPARTMENT AT [facility] Provider Note   CSN: [id number] Arrival date & time: 08/24/23  1526     Patient presents with: No chief complaint on file.   Deborah Gallegos is a 73 y.o. female.   73 year old female presenting emergency department with laceration to the posterior head.  Was going to sit down in her chair, but missed the chair in fell onto her butt, back of her head hit chair causing laceration.  No LOC.  No nausea or vomiting.  Not on blood thinners.  Does note history of prior meningioma status postresection.        Prior to Admission medications   Medication Sig Start Date End Date Taking? Authorizing Provider  amLODipine-benazepril (LOTREL) 5-20 MG capsule Take 1 capsule by mouth daily. 10/05/10   [provider]  BRIVIACT 100 MG TABS Take 1 tablet by mouth 2 (two) times daily. Patient not taking: Reported on 05/23/2022 10/02/16   [provider]  dapagliflozin propanediol (FARXIGA) 5 MG TABS tablet Take by mouth daily.    [provider]  ibuprofen  (ADVIL ,MOTRIN ) 400 MG tablet Take 1 tablet (400 mg total) by mouth every 6 (six) hours as needed. Patient not taking: Reported on 05/23/2022 04/24/17   Burky, Natalie B, NP  lamoTRIgine  (LAMICTAL ) 100 MG tablet Take 200-250 tablets by mouth 2 (two) times daily. 250 MG in the morning and 200 MG in the evening 10/01/16   [provider]  meclizine  (ANTIVERT ) 50 MG tablet Take 1 tablet (50 mg total) by mouth 3 (three) times daily as needed. Patient not taking: Reported on 05/23/2022 12/27/12   Lynwood Anes, MD  meloxicam (MOBIC) 15 MG tablet Take 15 mg by mouth daily.    [provider]  rosuvastatin (CRESTOR) 10 MG tablet Take 10 mg by mouth daily.    [provider]    Allergies: Patient has no known allergies.    Review of Systems  Updated Vital Signs BP 114/60   Pulse (!) 59   Temp 98.6 F (37 C) (Oral)   Resp 16   Wt 81.6 kg   SpO2 96%    BMI 31.89 kg/m   Physical Exam Vitals and nursing note reviewed.  Constitutional:      General: She is not in acute distress.    Appearance: She is obese. She is not toxic-appearing.  HENT:     Head: Normocephalic.     Comments: 3 cm laceration to the posterior crown of head.    Nose: Nose normal.     Mouth/Throat:     Mouth: Mucous membranes are moist.   Eyes:     Conjunctiva/sclera: Conjunctivae normal.    Cardiovascular:     Rate and Rhythm: Normal rate and regular rhythm.  Pulmonary:     Effort: Pulmonary effort is normal.  Abdominal:     General: Abdomen is flat. There is no distension.     Palpations: Abdomen is soft.     Tenderness: There is no abdominal tenderness. There is no guarding or rebound.   Musculoskeletal:        General: Normal range of motion.   Skin:    General: Skin is warm.     Capillary Refill: Capillary refill takes less than 2 seconds.   Neurological:     Mental Status: She is alert and oriented to person, place, and time.     (all labs ordered are listed, but only abnormal results are displayed) Labs Reviewed - No  data to display  EKG: EKG Interpretation Date/Time:  Sunday August 24 2023 15:48:39 EDT Ventricular Rate:  61 PR Interval:  160 QRS Duration:  82 QT Interval:  424 QTC Calculation: 428 R Axis:   13  Text Interpretation: Sinus rhythm Confirmed by Neysa Clap [phone]) on 08/24/2023 4:31:15 PM  Radiology: CT Head Wo Contrast Result Date: 08/24/2023 CLINICAL DATA:  Head trauma, minor (Age >= 65y) EXAM: CT HEAD WITHOUT CONTRAST TECHNIQUE: Contiguous axial images were obtained from the base of the skull through the vertex without intravenous contrast. RADIATION DOSE REDUCTION: This exam was performed according to the departmental dose-optimization program which includes automated exposure control, adjustment of the mA and/or kV according to patient size and/or use of iterative reconstruction technique. COMPARISON:  08/03/2023  FINDINGS: Brain: Again seen is the large calcified meningioma along the right central skull base. Smaller right frontal parafalcine meningioma measures 1.5 cm. Right temporal and inferior frontal encephalomalacia again noted, unchanged. No acute hemorrhage or infarct. No hydrocephalus or midline shift. Vascular: No hyperdense vessel or unexpected calcification. Skull: No acute calvarial abnormality. Prior right frontotemporal craniotomy. Sinuses/Orbits: No acute findings Other: None IMPRESSION: No acute intracranial abnormality. Chronic meningiomas, stable. Electronically Signed   By: Franky Crease M.D.   On: 08/24/2023 17:09     .Laceration Repair  Date/Time: 08/24/2023 5:23 PM  Performed by: Neysa Clap PARAS, DO Authorized by: Neysa Clap PARAS, DO   Consent:    Consent obtained:  Verbal   Consent given by:  Patient   Risks discussed:  Infection, pain, poor cosmetic result, retained foreign body, vascular damage, tendon damage, need for additional repair and poor wound healing   Alternatives discussed:  No treatment Universal protocol:    Procedure explained and questions answered to patient or proxy's satisfaction: yes     Patient identity confirmed:  Verbally with patient Anesthesia:    Anesthesia method:  None Laceration details:    Location:  Scalp   Scalp location:  Crown   Length (cm):  3 Exploration:    Imaging outcome: foreign body not noted     Wound exploration: wound explored through full range of motion and entire depth of wound visualized     Wound extent: no foreign body   Treatment:    Area cleansed with:  Saline   Amount of cleaning:  Standard Skin repair:    Repair method:  Staples   Number of staples:  5 Approximation:    Approximation:  Close Repair type:    Repair type:  Simple Post-procedure details:    Dressing:  Open (no dressing)   Procedure completion:  Tolerated    Medications Ordered in the ED  Tdap (BOOSTRIX ) injection 0.5 mL (0.5 mLs Intramuscular  Given 08/24/23 1635)    Clinical Course as of 08/24/23 1724  Sun Aug 24, 2023  1717 CT Head Wo Contrast IMPRESSION: No acute intracranial abnormality.  Chronic meningiomas, stable.   Electronically Signed   By: Franky Crease M.D.   On: 08/24/2023 17:09   [TY]    Clinical Course User Index [TY] Neysa Clap PARAS, DO                                 Medical Decision Making This is a 73 year old female presenting emergency department for laceration to the back of her head.  She is afebrile nontachycardic, hemodynamically stable.  Neurovascular intact without focal deficits.  3 cm laceration noted.  No foreign bodies.  Will need staples.  See procedure note.  Given patient's age, following prior brain surgery will get CT head to evaluate for acute intracranial pathology.  Fall seemingly mechanical in nature; will forego lab testing as I do not feel they would change management or disposition at this time given the likelihood of abnormalities.  Chart reviewed, does not appear to be on blood thinner.  Plan for CT head and lack repair.    CT scan negative. 5 staples placed. Stable for discharge.   Amount and/or Complexity of Data Reviewed Radiology: ordered. Decision-making details documented in ED Course.  Risk Prescription drug management.      Final diagnoses:  None    ED Discharge Orders     None          Neysa Caron PARAS, DO 08/24/23 1724

## 2023-08-24 NOTE — ED Triage Notes (Signed)
No thinners

## 2023-09-09 ENCOUNTER — Emergency Department (HOSPITAL_BASED_OUTPATIENT_CLINIC_OR_DEPARTMENT_OTHER): Admission: EM | Admit: 2023-09-09 | Discharge: 2023-09-09 | Disposition: A | Discharge status: Home / Self Care

## 2023-09-09 ENCOUNTER — Encounter (HOSPITAL_BASED_OUTPATIENT_CLINIC_OR_DEPARTMENT_OTHER): Payer: Self-pay

## 2023-09-09 ENCOUNTER — Other Ambulatory Visit: Payer: Self-pay

## 2023-09-09 DIAGNOSIS — S0101XD Laceration without foreign body of scalp, subsequent encounter: Secondary | ICD-10-CM | POA: Diagnosis not present

## 2023-09-09 DIAGNOSIS — Z4802 Encounter for removal of sutures: Secondary | ICD-10-CM | POA: Insufficient documentation

## 2023-09-09 DIAGNOSIS — W19XXXD Unspecified fall, subsequent encounter: Secondary | ICD-10-CM | POA: Diagnosis not present

## 2023-09-09 NOTE — ED Triage Notes (Signed)
 Pt presents via POV due to need to removal staple from head.

## 2023-09-09 NOTE — Discharge Instructions (Signed)
 Follow-up with your primary care doctor as needed, no additional treatment required in the emergency department, you can wash your hair as normal.

## 2023-09-09 NOTE — ED Provider Notes (Signed)
 [place] [facility] AT [facility] Provider Note   CSN: [id number] Arrival date & time: 09/09/23  8147     Patient presents with: Suture / Staple Removal   Deborah Gallegos is a 73 y.o. female with overall noncontributory past medical history who presents due to needing staples removed from head after fall, and head laceration repair on 6/22.  Reports no issues with healing.  Reports no irritation or pain at the site of repair.  5 staples were placed on 6/22, I can see 5 staples on my exam today.    Suture / Staple Removal       Prior to Admission medications   Medication Sig Start Date End Date Taking? Authorizing Provider  amLODipine-benazepril (LOTREL) 5-20 MG capsule Take 1 capsule by mouth daily. 10/05/10   [provider]  BRIVIACT 100 MG TABS Take 1 tablet by mouth 2 (two) times daily. Patient not taking: Reported on 05/23/2022 10/02/16   [provider]  dapagliflozin propanediol (FARXIGA) 5 MG TABS tablet Take by mouth daily.    [provider]  ibuprofen  (ADVIL ,MOTRIN ) 400 MG tablet Take 1 tablet (400 mg total) by mouth every 6 (six) hours as needed. Patient not taking: Reported on 05/23/2022 04/24/17   Burky, Natalie B, NP  lamoTRIgine  (LAMICTAL ) 100 MG tablet Take 200-250 tablets by mouth 2 (two) times daily. 250 MG in the morning and 200 MG in the evening 10/01/16   [provider]  meclizine  (ANTIVERT ) 50 MG tablet Take 1 tablet (50 mg total) by mouth 3 (three) times daily as needed. Patient not taking: Reported on 05/23/2022 12/27/12   Lynwood Anes, MD  meloxicam (MOBIC) 15 MG tablet Take 15 mg by mouth daily.    [provider]  rosuvastatin (CRESTOR) 10 MG tablet Take 10 mg by mouth daily.    [provider]    Allergies: Patient has no known allergies.    Review of Systems  All other systems reviewed and are negative.   Updated Vital Signs BP 122/80   Pulse (!) 55   Temp 98.1 F (36.7 C)  (Axillary)   Resp 16   SpO2 98%   Physical Exam Vitals and nursing note reviewed.  Constitutional:      General: She is not in acute distress.    Appearance: Normal appearance.  HENT:     Head: Normocephalic and atraumatic.     Comments: Appropriately healing scalp laceration with eschar, 5 staples in place, no evidence of dehiscence or infection. Eyes:     General:        Right eye: No discharge.        Left eye: No discharge.  Cardiovascular:     Rate and Rhythm: Normal rate and regular rhythm.  Pulmonary:     Effort: Pulmonary effort is normal. No respiratory distress.  Musculoskeletal:        General: No deformity.  Skin:    General: Skin is warm and dry.  Neurological:     Mental Status: She is alert and oriented to person, place, and time.  Psychiatric:        Mood and Affect: Mood normal.        Behavior: Behavior normal.     (all labs ordered are listed, but only abnormal results are displayed) Labs Reviewed - No data to display  EKG: None  Radiology: No results found.   Suture Removal  Date/Time: 09/09/2023 7:36 PM  Performed by: Rosan Sherlean DEL, PA-C  Authorized by: Rosan Sherlean DEL, PA-C   Consent:    Consent obtained:  Verbal   Consent given by:  Patient   Risks, benefits, and alternatives were discussed: yes     Risks discussed:  Bleeding, pain and wound separation   Alternatives discussed:  No treatment Universal protocol:    Procedure explained and questions answered to patient or proxy's satisfaction: yes     Patient identity confirmed:  Verbally with patient Location:    Location:  Head/neck   Head/neck location:  Scalp Procedure details:    Wound appearance:  No signs of infection, good wound healing and clean   Number of staples removed:  5 Post-procedure details:    Post-removal:  No dressing applied   Procedure completion:  Tolerated    Medications Ordered in the ED - No data to display                                   Medical Decision Making  I identified 5 staples, 5 staples were placed on 6/22.  Wound appears to be healing appropriately, staples removed without difficulty.  Patient tolerated the procedure without pain.  Discussed no additional follow-up needed, patient discharged in stable condition at this time. Final diagnoses:  None    ED Discharge Orders     None          Rosan Sherlean DEL DEVONNA 09/09/23 1937    Simon Lavonia SAILOR, MD 09/09/23 2330

## 2023-09-09 NOTE — ED Notes (Addendum)
 Cart and removal kits at bedside at this time. Family present at bedside as well.

## 2023-09-10 DIAGNOSIS — S62515D Nondisplaced fracture of proximal phalanx of left thumb, subsequent encounter for fracture with routine healing: Secondary | ICD-10-CM | POA: Diagnosis not present

## 2023-10-29 DIAGNOSIS — S62515D Nondisplaced fracture of proximal phalanx of left thumb, subsequent encounter for fracture with routine healing: Secondary | ICD-10-CM | POA: Diagnosis not present

## 2023-12-18 DIAGNOSIS — R296 Repeated falls: Secondary | ICD-10-CM | POA: Diagnosis not present

## 2023-12-18 DIAGNOSIS — G40009 Localization-related (focal) (partial) idiopathic epilepsy and epileptic syndromes with seizures of localized onset, not intractable, without status epilepticus: Secondary | ICD-10-CM | POA: Diagnosis not present

## 2023-12-18 DIAGNOSIS — I10 Essential (primary) hypertension: Secondary | ICD-10-CM | POA: Diagnosis not present

## 2023-12-18 DIAGNOSIS — G4730 Sleep apnea, unspecified: Secondary | ICD-10-CM | POA: Diagnosis not present

## 2023-12-18 DIAGNOSIS — E6609 Other obesity due to excess calories: Secondary | ICD-10-CM | POA: Diagnosis not present

## 2023-12-18 DIAGNOSIS — E559 Vitamin D deficiency, unspecified: Secondary | ICD-10-CM | POA: Diagnosis not present

## 2023-12-18 DIAGNOSIS — R7309 Other abnormal glucose: Secondary | ICD-10-CM | POA: Diagnosis not present

## 2023-12-18 DIAGNOSIS — R634 Abnormal weight loss: Secondary | ICD-10-CM | POA: Diagnosis not present

## 2024-02-17 ENCOUNTER — Other Ambulatory Visit: Payer: Self-pay

## 2024-02-17 ENCOUNTER — Encounter (HOSPITAL_BASED_OUTPATIENT_CLINIC_OR_DEPARTMENT_OTHER): Payer: Self-pay

## 2024-02-17 ENCOUNTER — Emergency Department (HOSPITAL_BASED_OUTPATIENT_CLINIC_OR_DEPARTMENT_OTHER)
Admission: EM | Admit: 2024-02-17 | Discharge: 2024-02-17 | Disposition: A | Discharge status: Home / Self Care | Attending: Emergency Medicine | Admitting: Emergency Medicine

## 2024-02-17 ENCOUNTER — Emergency Department (HOSPITAL_BASED_OUTPATIENT_CLINIC_OR_DEPARTMENT_OTHER)

## 2024-02-17 DIAGNOSIS — S0003XA Contusion of scalp, initial encounter: Secondary | ICD-10-CM

## 2024-02-17 DIAGNOSIS — M542 Cervicalgia: Secondary | ICD-10-CM | POA: Diagnosis not present

## 2024-02-17 DIAGNOSIS — W07XXXA Fall from chair, initial encounter: Secondary | ICD-10-CM | POA: Diagnosis not present

## 2024-02-17 DIAGNOSIS — W19XXXA Unspecified fall, initial encounter: Secondary | ICD-10-CM

## 2024-02-17 DIAGNOSIS — Z79899 Other long term (current) drug therapy: Secondary | ICD-10-CM | POA: Diagnosis not present

## 2024-02-17 DIAGNOSIS — S0990XA Unspecified injury of head, initial encounter: Secondary | ICD-10-CM | POA: Diagnosis present

## 2024-02-17 NOTE — ED Triage Notes (Addendum)
 Clemens out of chair after reaching too far forward. Negative LOC. Pain to right forehead and left posterior neck pain. Abrasions to right forehead. Placed in c-collar per EMS. No blood thinners.

## 2024-02-17 NOTE — Discharge Instructions (Signed)
 As we discussed, your scans were all relatively normal.  I do see evidence of those tumors you were talking about.  They do see him to be stable.  The meningioma is slightly increased in size.  We also found a thyroid  nodule on the CT scan of your neck.  This needs to be followed up with your primary care doctor.  Please turn to the emergency department for any worsening symptoms.

## 2024-02-17 NOTE — ED Provider Notes (Signed)
 [place] EMERGENCY DEPARTMENT AT [facility] Provider Note   CSN: [id number] Arrival date & time: 02/17/24  1449     Patient presents with: Deborah Gallegos KATHEE [name] is a 73 y.o. female patient who presents to the emergency department today for further evaluation of a mechanical trip and fall.  Patient was sitting in her recliner when something fell forward and she reached over to grab it and fell forward hitting her face on the ground.  She did not lose consciousness.  She denies any visual disturbances, focal weakness, focal numbness.  She is complaining of right sided frontal headache after the fall.  Patient is not anticoagulated.    Fall       Prior to Admission medications  Medication Sig Start Date End Date Taking? Authorizing Provider  amLODipine-benazepril (LOTREL) 5-20 MG capsule Take 1 capsule by mouth daily. 10/05/10   [provider]  BRIVIACT 100 MG TABS Take 1 tablet by mouth 2 (two) times daily. Patient not taking: Reported on 05/23/2022 10/02/16   [provider]  dapagliflozin propanediol (FARXIGA) 5 MG TABS tablet Take by mouth daily.    [provider]  ibuprofen  (ADVIL ,MOTRIN ) 400 MG tablet Take 1 tablet (400 mg total) by mouth every 6 (six) hours as needed. Patient not taking: Reported on 05/23/2022 04/24/17   Burky, Natalie B, NP  lamoTRIgine  (LAMICTAL ) 100 MG tablet Take 200-250 tablets by mouth 2 (two) times daily. 250 MG in the morning and 200 MG in the evening 10/01/16   [provider]  meclizine  (ANTIVERT ) 50 MG tablet Take 1 tablet (50 mg total) by mouth 3 (three) times daily as needed. Patient not taking: Reported on 05/23/2022 12/27/12   Lynwood Anes, MD  meloxicam (MOBIC) 15 MG tablet Take 15 mg by mouth daily.    [provider]  rosuvastatin (CRESTOR) 10 MG tablet Take 10 mg by mouth daily.    [provider]    Allergies: Patient has no known allergies.    Review of Systems  All other  systems reviewed and are negative.   Updated Vital Signs BP 121/60   Pulse (!) 51   Temp 97.6 F (36.4 C)   Resp 18   SpO2 100%   Physical Exam Vitals and nursing note reviewed.  Constitutional:      General: She is not in acute distress.    Appearance: Normal appearance.  HENT:     Head: Normocephalic.   Eyes:     General:        Right eye: No discharge.        Left eye: No discharge.  Cardiovascular:     Comments: Regular rate and rhythm.  S1/S2 are distinct without any evidence of murmur, rubs, or gallops.  Radial pulses are 2+ bilaterally.  Dorsalis pedis pulses are 2+ bilaterally.  No evidence of pedal edema. Pulmonary:     Comments: Clear to auscultation bilaterally.  Normal effort.  No respiratory distress.  No evidence of wheezes, rales, or rhonchi heard throughout. Abdominal:     General: Abdomen is flat. Bowel sounds are normal. There is no distension.     Tenderness: There is no abdominal tenderness. There is no guarding or rebound.  Musculoskeletal:        General: Normal range of motion.     Cervical back: Neck supple.  Skin:    General: Skin is warm and dry.     Findings: No rash.  Neurological:  General: No focal deficit present.     Mental Status: She is alert.  Psychiatric:        Mood and Affect: Mood normal.        Behavior: Behavior normal.     (all labs ordered are listed, but only abnormal results are displayed) Labs Reviewed - No data to display  EKG: None  Radiology: CT Head Wo Contrast Result Date: 02/17/2024 EXAM: CT HEAD WITHOUT CONTRAST 02/17/2024 06:10:21 PM TECHNIQUE: CT of the head was performed without the administration of intravenous contrast. Automated exposure control, iterative reconstruction, and/or weight based adjustment of the mA/kV was utilized to reduce the radiation dose to as low as reasonably achievable. COMPARISON: 08/24/2023 CLINICAL HISTORY: Head trauma, moderate-severe FINDINGS: BRAIN AND VENTRICLES: No acute  hemorrhage. No evidence of acute infarct. Patchy and confluent decreased attenuation throughout deep and periventricular white matter bilaterally, compatible with chronic microvascular ischemic disease. Right frontal and temporal encephalomalacia. 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 Slight interval increase in size of a 1.7 x 1.2 cm (from 1.5 x 1 cm) right parafalcine meningioma with associated mass effect. Cerebral ventricle sizes concordant with degree of cerebral volume loss. No hydrocephalus. No extra-axial collection. No midline shift. ORBITS: Right lens replacement. No acute abnormality. SINUSES: No acute abnormality. SOFT TISSUES AND SKULL: Right pterional craniotomy noted. Acute 4 mm right frontal scalp hematoma. No skull fracture. IMPRESSION: 1. No acute intracranial abnormality related to head trauma. 2. 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 3. 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 Electronically signed by: Morgane Naveau MD 02/17/2024 06:58 PM EST RP Workstation: [id number]   CT Maxillofacial Wo Contrast Result Date: 02/17/2024 EXAM: CT OF THE FACE WITHOUT CONTRAST 02/17/2024 06:10:21 PM TECHNIQUE: CT of the face was performed without the administration of intravenous contrast. Multiplanar reformatted images are provided for review. Automated exposure control, iterative reconstruction, and/or weight based adjustment of the mA/kV was utilized to reduce the radiation dose to as low as reasonably achievable. COMPARISON: CT from 10/23/2016. CLINICAL HISTORY: Facial trauma, blunt. FINDINGS: FACIAL BONES: No acute maxillofacial fracture. No mandibular condyle dislocation. 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 No suspicious bone lesion. Similar appearance of partially calcified  meningioma along the right anterior clinoid process compared to the CT from 10/23/2016. There are similar areas of hyperostosis along the right anterior clinoid process and right sphenoid wings. Postsurgical changes of right temporal craniotomy. ORBITS: Globes are intact. No acute traumatic injury. No inflammatory change. Right lens replacement. SINUSES AND MASTOIDS: No acute abnormality. SOFT TISSUES: Right forehead soft tissue hematoma. IMPRESSION: 1. No acute maxillofacial fracture or mandibular condyle dislocation. 2. Right forehead soft tissue hematoma. Electronically signed by: Donnice Mania MD 02/17/2024 06:49 PM EST RP Workstation: [id number]   CT Cervical Spine Wo Contrast Result Date: 02/17/2024 EXAM: CT CERVICAL SPINE WITHOUT CONTRAST 02/17/2024 06:10:21 PM TECHNIQUE: CT of the cervical spine was performed without the administration of intravenous contrast. Multiplanar reformatted images are provided for review. Automated exposure control, iterative reconstruction, and/or weight based adjustment of the mA/kV was utilized to reduce the radiation dose to as low as reasonably achievable. COMPARISON: 12/23/2022 CLINICAL HISTORY: Neck trauma (Age >= 65y) FINDINGS: BONES AND ALIGNMENT: Slight reversal of the normal cervical lordosis. No evidence of traumatic malalignment. No acute fracture. DEGENERATIVE CHANGES: Disc space narrowing  at multiple levels. Degenerative endplate osteophytes greatest at C5-C6. 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 No high grade osseous spinal canal stenosis. Facet arthrosis and uncovertebral hypertrophy at multiple levels. SOFT TISSUES: Partially calcified nodule in the left thyroid  measuring up to 1.7 cm. Recommend nonemergent correlation with thyroid  ultrasound. No prevertebral soft tissue swelling. IMPRESSION: 1. No evidence of acute traumatic injury. 2. Degenerative changes as above. 3. Partially calcified left thyroid  nodule  measuring up to 1.7 cm. Recommend nonemergent thyroid  ultrasound. Electronically signed by: Donnice Mania MD 02/17/2024 06:44 PM EST RP Workstation: [id number]     Procedures   Medications Ordered in the ED - No data to display  Clinical Course as of 02/17/24 1911  Tue Feb 17, 2024  1905 CT Head Wo Contrast No signs of intracranial hemorrhage.  I do see evidence of the previous known tumors but the patient has.  I do agree with the radiologist interpretation. [CF]  1905 CT Maxillofacial Wo Contrast No evidence of fracture.  There is evidence of a frontal hematoma consistent with the trauma from the fall.  I do agree with radiologist interpretation. [CF]  1906 CT Cervical Spine Wo Contrast Thyroid  nodule.  No evidence of cervical fracture.  I do agree with radiologist interpretation. [CF]    Clinical Course User Index [CF] Theotis Cameron HERO, PA-C    Medical Decision Making KURT AZIMI is a 73 y.o. female patient who presents to the emergency department today for further evaluation of head injury after subsequent mechanical fall.  Will plan to get CT scans over the head, face, and neck to further assess.  She is in a c-collar for her safety.  Patient normally takes ibuprofen  for pain.  CT scans were all normal.  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  I informed her of her thyroid  nodule that was found on CT scan.  She does state that she has had this evaluated many years ago.  She will follow-up with her primary care doctor.  Patient ready to go home which I think is totally appropriate.  Strict turn precautions were discussed.  She is safe for discharge at this time.   Amount and/or Complexity of Data Reviewed Radiology: ordered. Decision-making details documented in ED Course.     Final diagnoses:  Fall, initial encounter  Hematoma of frontal scalp, initial encounter    ED Discharge Orders     None          Theotis Cameron [place], [place] 02/17/24 1911     Charlyn Sora, MD 02/18/24 1512

## 2024-03-18 ENCOUNTER — Ambulatory Visit: Attending: Family Medicine

## 2024-03-18 NOTE — Therapy (Incomplete)
 " OUTPATIENT PHYSICAL THERAPY LOWER EXTREMITY EVALUATION   Patient Name: Deborah Gallegos MRN: [id number] DOB:03/21/1950, 74 y.o., female Today's Date: 03/18/2024  END OF SESSION:   Past Medical History:  Diagnosis Date   Hypertension    Seizure [facility])    Past Surgical History:  Procedure Laterality Date   ABDOMINAL HYSTERECTOMY     BRAIN TUMOR EXCISION     CATARACT EXTRACTION     EYE SURGERY     There are no active problems to display for this patient.   PCP: Benjamine Aland, MD   REFERRING PROVIDER: Benjamine Aland, MD   REFERRING DIAG: W19.XXXA (ICD-10-CM) - Fall   THERAPY DIAG:  No diagnosis found.  Rationale for Evaluation and Treatment: Rehabilitation  ONSET DATE: ***  SUBJECTIVE:   SUBJECTIVE STATEMENT: ***  PERTINENT HISTORY: Relevant PMHx including seizures, HTN  PAIN:  Are you having pain? {OPRCPAIN:27236}  PRECAUTIONS: {Therapy precautions:24002}  RED FLAGS: {PT Red Flags:29287}   WEIGHT BEARING RESTRICTIONS: {Yes ***/No:24003}  FALLS:  Has patient fallen in last 6 months? {fallsyesno:27318}  LIVING ENVIRONMENT: Lives with: {OPRC lives with:25569::lives with their family} Lives in: {Lives in:25570} Stairs: {opstairs:27293} Has following equipment at home: {Assistive devices:23999}  OCCUPATION: ***  PLOF: {PLOF:24004}  PATIENT GOALS: ***  NEXT MD VISIT: ***  OBJECTIVE:  Note: Objective measures were completed at Evaluation unless otherwise noted.  DIAGNOSTIC FINDINGS: ***   PATIENT SURVEYS:   PSFS: THE PATIENT SPECIFIC FUNCTIONAL SCALE  Place score of 0-10 (0 = unable to perform activity and 10 = able to perform activity at the same level as before injury or problem)  Activity Date: 03/18/2024          2.     3.     4.      Total Score ***      Total Score = Sum of activity scores/number of activities  Minimally Detectable Change: 3 points (for single activity); 2 points (for average score)  [facility]  (nd). The Patient Specific Functional Scale . Retrieved from [email or web address]   COGNITION: Overall cognitive status: Within functional limits for tasks assessed     SENSATION: {sensation:27233}  EDEMA:  {edema:24020}  MUSCLE LENGTH: Hamstrings: Right *** deg; Left *** deg Deborah Gallegos test: Right *** deg; Left *** deg  POSTURE: {posture:25561}  PALPATION: ***  LOWER EXTREMITY ROM:  Active ROM Right eval Left eval  Hip flexion    Hip extension    Hip abduction    Hip adduction    Hip internal rotation    Hip external rotation    Knee flexion    Knee extension    Ankle dorsiflexion    Ankle plantarflexion    Ankle inversion    Ankle eversion     (Blank rows = not tested)  LOWER EXTREMITY MMT:  MMT Right eval Left eval  Hip flexion    Hip extension    Hip abduction    Hip adduction    Hip internal rotation    Hip external rotation    Knee flexion    Knee extension    Ankle dorsiflexion    Ankle plantarflexion    Ankle inversion    Ankle eversion     (Blank rows = not tested)  LOWER EXTREMITY SPECIAL TESTS:  {LEspecialtests:26242}  FUNCTIONAL TESTS:  {Functional tests:24029}  GAIT: Distance walked: *** Assistive device utilized: {Assistive devices:23999} Level of assistance: {Levels of assistance:24026} Comments: ***  DGI {Gait level surface - walking at normal speed:27153} {Change in  gait speed:27154} {Gait with horizontal head turns:27155} {Gait with vertical head turns:27156} {Gait and pivot turn:27157} {Step over obstacle:27158} {Step around obstacles:27159} {Steps:27160} Total Score: ***/24 Scores below 19 suggest that the subject assessed has a higher risk of prospective falls                                                                                                                                 TREATMENT DATE:   [facility] Adult PT Treatment:                                                 DATE: 03/18/2024   Initial evaluation: see patient education and home exercise program as noted below      PATIENT EDUCATION:  Education details: reviewed initial home exercise program; discussion of POC, prognosis and goals for skilled PT   Person educated: {Person educated:25204} Education method: {Education Method:25205} Education comprehension: {Education Comprehension:25206}  HOME EXERCISE PROGRAM: ***  ASSESSMENT:  CLINICAL IMPRESSION: Deborah Gallegos is a 74 y.o. female who was seen today for physical therapy evaluation and treatment for ***. She is demonstrating ***. She has related pain and difficulty with ***. She requires skilled PT services at this time to address relevant deficits and improve overall function.     OBJECTIVE IMPAIRMENTS: {opptimpairments:25111}.   ACTIVITY LIMITATIONS: {activitylimitations:27494}  PARTICIPATION LIMITATIONS: {participationrestrictions:25113}  PERSONAL FACTORS: {Personal factors:25162} are also affecting patient's functional outcome.   REHAB POTENTIAL: {rehabpotential:25112}  CLINICAL DECISION MAKING: {clinical decision making:25114}  EVALUATION COMPLEXITY: {Evaluation complexity:25115}   GOALS: Goals reviewed with patient? YES  SHORT TERM GOALS: Target date: 04/15/2024    Patient will be independent with initial home program at least 3 days/week.  Baseline: provided at eval Goal Status: INITIAL   2.  Patient will demonstrate improved postural awareness for at least 15 minutes while seated without need for cueing from PT.  Baseline: see objective measures Goal Status: INITIAL   3.  *** Baseline:  Goal status: INITIAL   LONG TERM GOALS: Target date: 05/13/2024    Patient will report improved overall functional ability with PSFS score of ***.  Baseline:  Goal Status: INITIAL    2.  *** Baseline:  Goal status: INITIAL  3.  *** Baseline:  Goal status: INITIAL  4.  *** Baseline:  Goal status:  INITIAL     PLAN:  PT FREQUENCY: 1-2x/week  PT DURATION: 8 weeks  PLANNED INTERVENTIONS: {rehab planned interventions:25118::97110-Therapeutic exercises,97530- Therapeutic [phone]- Neuromuscular re-education,97535- Self Rjmz,02859- Manual therapy,Patient/Family education}  PLAN FOR NEXT SESSION: PIERRETTE Marko Molt, PT, DPT  03/18/2024 8:32 AM   "

## 2024-04-08 ENCOUNTER — Encounter: Payer: Self-pay | Admitting: *Deleted

## 2024-04-08 NOTE — Progress Notes (Signed)
 MAGON CROSON                                          MRN: [id number]   04/08/2024   The [facility] Quality Team Specialist reviewed this patient medical record for the purposes of chart review for care gap closure. The following were reviewed: chart review for care gap closure-controlling blood pressure.    [facility] Quality Team
